# Patient Record
Sex: Male | Born: 1970
Health system: Southern US, Community
[De-identification: ages and names within clinical notes are randomized; demographics above are authoritative.]

## PROBLEM LIST (undated history)

## (undated) ENCOUNTER — Emergency Department (HOSPITAL_COMMUNITY): Admission: EM | Payer: 59 | Source: Home / Self Care

## (undated) DIAGNOSIS — J189 Pneumonia, unspecified organism: Secondary | ICD-10-CM

## (undated) DIAGNOSIS — E119 Type 2 diabetes mellitus without complications: Secondary | ICD-10-CM

## (undated) DIAGNOSIS — R519 Headache, unspecified: Secondary | ICD-10-CM

## (undated) DIAGNOSIS — G8929 Other chronic pain: Secondary | ICD-10-CM

## (undated) DIAGNOSIS — G5603 Carpal tunnel syndrome, bilateral upper limbs: Secondary | ICD-10-CM

## (undated) DIAGNOSIS — G459 Transient cerebral ischemic attack, unspecified: Secondary | ICD-10-CM

## (undated) DIAGNOSIS — I82409 Acute embolism and thrombosis of unspecified deep veins of unspecified lower extremity: Secondary | ICD-10-CM

## (undated) DIAGNOSIS — I2699 Other pulmonary embolism without acute cor pulmonale: Secondary | ICD-10-CM

## (undated) DIAGNOSIS — R51 Headache: Secondary | ICD-10-CM

## (undated) DIAGNOSIS — Q984 Klinefelter syndrome, unspecified: Secondary | ICD-10-CM

## (undated) DIAGNOSIS — K2970 Gastritis, unspecified, without bleeding: Secondary | ICD-10-CM

## (undated) DIAGNOSIS — B9681 Helicobacter pylori [H. pylori] as the cause of diseases classified elsewhere: Secondary | ICD-10-CM

## (undated) DIAGNOSIS — K219 Gastro-esophageal reflux disease without esophagitis: Secondary | ICD-10-CM

## (undated) HISTORY — DX: Klinefelter syndrome, unspecified: Q98.4

## (undated) HISTORY — DX: Gastro-esophageal reflux disease without esophagitis: K21.9

## (undated) HISTORY — DX: Headache: R51

## (undated) HISTORY — DX: Acute embolism and thrombosis of unspecified deep veins of unspecified lower extremity: I82.409

## (undated) HISTORY — DX: Headache, unspecified: R51.9

## (undated) HISTORY — DX: Gastritis, unspecified, without bleeding: K29.70

## (undated) HISTORY — DX: Helicobacter pylori (H. pylori) as the cause of diseases classified elsewhere: B96.81

## (undated) HISTORY — DX: Other pulmonary embolism without acute cor pulmonale: I26.99

## (undated) HISTORY — DX: Carpal tunnel syndrome, bilateral upper limbs: G56.03

## (undated) HISTORY — DX: Pneumonia, unspecified organism: J18.9

## (undated) HISTORY — DX: Transient cerebral ischemic attack, unspecified: G45.9

## (undated) HISTORY — DX: Other chronic pain: G89.29

---

## 1998-10-28 ENCOUNTER — Ambulatory Visit: Admission: RE | Admit: 1998-10-28 | Discharge: 1998-10-28 | Payer: Self-pay | Admitting: Internal Medicine

## 2005-02-15 ENCOUNTER — Emergency Department (HOSPITAL_COMMUNITY): Admission: EM | Admit: 2005-02-15 | Discharge: 2005-02-15 | Payer: Self-pay | Admitting: Family Medicine

## 2006-06-23 ENCOUNTER — Ambulatory Visit: Payer: Self-pay | Admitting: Internal Medicine

## 2006-06-30 ENCOUNTER — Encounter (INDEPENDENT_AMBULATORY_CARE_PROVIDER_SITE_OTHER): Payer: Self-pay | Admitting: *Deleted

## 2006-06-30 ENCOUNTER — Ambulatory Visit (HOSPITAL_COMMUNITY): Admission: RE | Admit: 2006-06-30 | Discharge: 2006-06-30 | Payer: Self-pay | Admitting: Internal Medicine

## 2006-06-30 DIAGNOSIS — B9681 Helicobacter pylori [H. pylori] as the cause of diseases classified elsewhere: Secondary | ICD-10-CM

## 2006-06-30 HISTORY — DX: Helicobacter pylori (H. pylori) as the cause of diseases classified elsewhere: B96.81

## 2006-07-05 ENCOUNTER — Ambulatory Visit: Payer: Self-pay | Admitting: Internal Medicine

## 2007-08-27 ENCOUNTER — Emergency Department (HOSPITAL_COMMUNITY): Admission: EM | Admit: 2007-08-27 | Discharge: 2007-08-27 | Payer: Self-pay | Admitting: Emergency Medicine

## 2007-11-22 ENCOUNTER — Telehealth: Payer: Self-pay | Admitting: Internal Medicine

## 2008-02-19 ENCOUNTER — Encounter: Payer: Self-pay | Admitting: Internal Medicine

## 2008-05-27 ENCOUNTER — Emergency Department (HOSPITAL_COMMUNITY): Admission: EM | Admit: 2008-05-27 | Discharge: 2008-05-27 | Payer: Self-pay | Admitting: Emergency Medicine

## 2010-08-23 ENCOUNTER — Encounter (INDEPENDENT_AMBULATORY_CARE_PROVIDER_SITE_OTHER): Payer: Self-pay | Admitting: *Deleted

## 2010-09-02 NOTE — Letter (Signed)
Summary: New Patient letter  [facility]  [address] [place], [place] [address]   Phone: [phone]  Fax: [phone]       08/23/2010 MRN: [id number]  [name] [address] [place], [place]  [address]  Dear Timothy [name],  Welcome to the Gastroenterology Division at [facility].    You are scheduled to see Dr.  Juanda Chance on 10-11-10 at 2:45P.M. on the 3rd floor at [facility], [address]  We ask that you try to arrive at our office 15 minutes prior to your appointment time to allow for check-in.  We would like you to complete the enclosed self-administered evaluation form prior to your visit and bring it with you on the day of your appointment.  We will review it with you.  Also, please bring a complete list of all your medications or, if you prefer, bring the medication bottles and we will list them.  Please bring your insurance card so that we may make a copy of it.  If your insurance requires a referral to see a specialist, please bring your referral form from your primary care physician.  Co-payments are due at the time of your visit and may be paid by cash, check or credit card.     Your office visit will consist of a consult with your physician (includes a physical exam), any laboratory testing he/she may order, scheduling of any necessary diagnostic testing (e.g. x-ray, ultrasound, CT-scan), and scheduling of a procedure (e.g. Endoscopy, Colonoscopy) if required.  Please allow enough time on your schedule to allow for any/all of these possibilities.    If you cannot keep your appointment, please call [phone] to cancel or reschedule prior to your appointment date.  This allows [place] the opportunity to schedule an appointment for another patient in need of care.  If you do not cancel or reschedule by 5 p.m. the business day prior to your appointment date, you will be charged a $50.00 late cancellation/no-show fee.    Thank you for choosing  [facility] for your medical needs.  We appreciate the opportunity to care for you.  Please visit [place] at our website  to learn more about our practice.                     Sincerely,                                                             The Gastroenterology Division

## 2010-09-10 ENCOUNTER — Ambulatory Visit (HOSPITAL_BASED_OUTPATIENT_CLINIC_OR_DEPARTMENT_OTHER): Payer: 59 | Attending: Family Medicine

## 2010-09-10 DIAGNOSIS — G4730 Sleep apnea, unspecified: Secondary | ICD-10-CM | POA: Insufficient documentation

## 2010-09-10 DIAGNOSIS — G4710 Hypersomnia, unspecified: Secondary | ICD-10-CM | POA: Insufficient documentation

## 2010-09-18 DIAGNOSIS — G4730 Sleep apnea, unspecified: Secondary | ICD-10-CM

## 2010-09-18 DIAGNOSIS — G4710 Hypersomnia, unspecified: Secondary | ICD-10-CM

## 2010-09-19 NOTE — Procedures (Signed)
[name], Timothy [name]                 ACCOUNT NO.:  [id number]  MEDICAL RECORD NO.:  [id number]         PATIENT TYPE:  OUT  LOCATION:  SLEEP CENTER                 FACILITY:  [facility]  PHYSICIAN:  Clinton D. Maple Hudson, MD, FCCP, FACPDATE OF BIRTH:  12/23/70  DATE OF STUDY:  09/10/2010                           NOCTURNAL POLYSOMNOGRAM  REFERRING PHYSICIAN:  DAVID GIBSON  INDICATION FOR STUDY:  Hypersomnia with sleep apnea.  EPWORTH SLEEPINESS SCORE:  18/24, BMI 30.4.  Weight 250 pounds, height 76 inches.  Neck 16 inches.  MEDICATIONS:  Home medications are charted and reviewed.  SLEEP ARCHITECTURE:  Total sleep time 352 minutes with sleep efficiency 92.8%.  Stage I was 7.1%, stage II 71.7%, stage III absent, REM 21.2% of total sleep time.  Sleep latency 10.5 minutes, REM latency 70.5 minutes, awake after sleep onset 18 minutes, arousal index 13.6.  BEDTIME MEDICATION:  None.  RESPIRATORY DATA:  Apnea-hypopnea index (AHI) 0.7 per hour.  A total of 4 events was scored, all as hypopneas associated with non supine sleep and a REM.  RDI 2 per hour, REM/AHI 3.2 per hour.  There were insufficient events to permit application of CPAP titration by split protocol on the study night.  OXYGEN DATA:  Moderate snoring with oxygen desaturation to a nadir of 88% and a mean oxygen saturation on room air through the study of 93.3%.  CARDIAC DATA:  Normal sinus rhythm.  MOVEMENT-PARASOMNIA:  No significant movement disturbance.  No bathroom trips.  IMPRESSIONS-RECOMMENDATIONS: 1. Occasional respiratory event with sleep disturbance, within normal     limits, AHI 0.7 per hour (normal range 0-5 per hour).  Moderate     snoring with oxygen desaturation to a nadir of 88% and a mean     oxygen saturation     through the study of 93.3% on room air. 2. Unremarkable sleep architecture for sleep center environment.     Clinton D. Maple Hudson, MD, [facility], FACP Diplomate, Biomedical engineer of Sleep  Medicine Electronically Signed    CDY/MEDQ  D:  09/18/2010 12:49:57  T:  09/19/2010 04:50:25  Job:  [id number]

## 2010-10-05 ENCOUNTER — Inpatient Hospital Stay (INDEPENDENT_AMBULATORY_CARE_PROVIDER_SITE_OTHER)
Admission: RE | Admit: 2010-10-05 | Discharge: 2010-10-05 | Disposition: A | Discharge status: Home / Self Care | Payer: 59 | Source: Ambulatory Visit | Attending: Family Medicine | Admitting: Family Medicine

## 2010-10-05 DIAGNOSIS — J029 Acute pharyngitis, unspecified: Secondary | ICD-10-CM

## 2010-10-08 ENCOUNTER — Encounter: Payer: Self-pay | Admitting: Internal Medicine

## 2010-10-11 ENCOUNTER — Encounter: Payer: Self-pay | Admitting: Internal Medicine

## 2010-10-11 ENCOUNTER — Ambulatory Visit (INDEPENDENT_AMBULATORY_CARE_PROVIDER_SITE_OTHER): Payer: 59 | Admitting: Internal Medicine

## 2010-10-11 VITALS — BP 112/74 | HR 84 | Ht 76.0 in | Wt 254.4 lb

## 2010-10-11 DIAGNOSIS — K219 Gastro-esophageal reflux disease without esophagitis: Secondary | ICD-10-CM

## 2010-10-11 MED ORDER — ESOMEPRAZOLE MAGNESIUM 40 MG PO CPDR: 40.0000 mg | DELAYED_RELEASE_CAPSULE | Freq: Two times a day (BID) | ORAL | Status: DC

## 2010-10-11 NOTE — Progress Notes (Signed)
Timothy [name] 03/12/1971 MRN [id number]     History of Present Illness:  This is a 40 year old white male with a history of chronic gastroesophageal reflux. Her last office visit was in 2008. Her last upper endoscopy in 2008 showed esophagitis. His mother who is our patient has Barrett's esophagus. His symptoms were initially controlled with Nexium 40 mg a day but recently, he has had refractory symptoms. He has occasional dysphagia. He has occasional nocturnal cough but denies hoarseness. He was treated for H. Pylori in the past.   Past Medical History  Diagnosis Date  . Helicobacter pylori gastritis 06/30/06  . GERD (gastroesophageal reflux disease)    Past Surgical History  Procedure Date  . Unremarkable     reports that he has never smoked. He has never used smokeless tobacco. He reports that he does not drink alcohol or use illicit drugs. family history includes Barrett's esophagus in his mother; Breast cancer in his mother; Diabetes in his paternal grandfather; Heart disease in an unspecified family member; and Irritable bowel syndrome in his brother. No Known Allergies      Review of Systems:  The remainder of the 10  point ROS is negative except as outlined in H&P   Physical Exam: General appearance  Well developed, in no distress. Eyes- non icteric. HEENT nontraumatic, normocephalic. Mouth no lesions, tongue papillated, no cheilosis. Neck supple without adenopathy, thyroid not enlarged, no carotid bruits, no JVD. Lungs Clear to auscultation bilaterally. Cor normal S1 normal S2, regular rhythm , no murmur,  quiet precordium. Abdomen soft nontender with normoactive bowel sounds. Rectal: Deferred. Extremities no pedal edema. Skin no lesions. Neurological alert and oriented x 3.  Psychological normal mood and affect.  Assessment and Plan:   Problem #1: Gastroesophageal reflux disease with refractory symptoms on Nexium 40 mg daily. Patient has been advised to  follow stricter antireflux measures. We will proceed with an upper endoscopy and possible dilatation. We will increase his Nexium to 40 mg by mouth twice a day.  10/11/2010 [name]

## 2010-10-11 NOTE — Patient Instructions (Addendum)
We have sent a prescription for Nexium to your pharmacy. You should take 1 tablet by mouth twice daily. We have recommended that you have an endoscopy with possible dilation. We will wait on you to call and schedule this per your request. CC: Dr Rosezetta Schlatter

## 2010-10-22 NOTE — Assessment & Plan Note (Signed)
Timothy [name]                         GASTROENTEROLOGY OFFICE NOTE   Timothy [name], Timothy [name]                        MRN:          [id number]  DATE:06/23/2006                            DOB:          May 10, 1971    Timothy [name] is a 40 year old gentleman here for evaluation of recurrent  heartburn.  We saw him in 2000 for gastroesophageal reflux disease.  Upper endoscopy showed essentially normal exam.  His CLOtest was  negative for H. pylori.  His mother has been a patient of ours for  Barrett's esophagus and underwent Nissen fundoplication.  Timothy [name] did well  for a while on Nexium 40 mg a day, until his insurance switched him to  Prilosec 20 mg a day about 3 years ago.  It initially worked partially.  Later on, he had to increase it to twice a day.  Now he has breakthrough  heartburn several times a week, and occasionally dysphagia.  He does not  smoke, does not drink alcohol excessively.  He has cut out caffeine out  of his diet.  His eating habits are regular, having dinner around 7:30  p.m.  He has, however, gained about 20 pounds since I saw him last time,  6 years ago.   PHYSICAL EXAMINATION:  Blood pressure 120/70, pulse 72, and weight 254  pounds.  He was alert, oriented, and in no distress.  LUNGS:  Clear to auscultation.  There were no wheezes or rales.  COR:  Normal S1 and normal S2.  ABDOMEN:  Soft, mildly protuberant with normoactive bowel sounds.  No  tenderness in epigastrium.  RECTAL EXAM:  Not done.  EXTREMITIES:  No edema.   IMPRESSION:  A 40 year old white male with recurrent gastroesophageal  reflux, not controlled on omeprazole 20 mg twice a day.  He has a family  history of Barrett's esophagus and severe reflux disease.   PLAN:  1. Repeat upper endoscopy to rule out Barrett's esophagus.  2. Weight loss.  3. Put back on Nexium 40 mg a day.  Patient will find out from his      insurance company preferable PPI, so we can minimize his co  pay.      If he has intractable reflux, he may have to be evaluated for      Nissen fundoplication.     Timothy [name]. Timothy Chance, MD  Electronically Signed    DMB/MedQ  DD: 06/23/2006  DT: 06/23/2006  Job #: [id number]   cc:   Evelena Peat, M.D.

## 2010-11-05 ENCOUNTER — Ambulatory Visit (INDEPENDENT_AMBULATORY_CARE_PROVIDER_SITE_OTHER): Payer: 59 | Admitting: Pulmonary Disease

## 2010-11-05 ENCOUNTER — Institutional Professional Consult (permissible substitution): Payer: 59 | Admitting: Internal Medicine

## 2010-11-05 ENCOUNTER — Encounter: Payer: Self-pay | Admitting: Pulmonary Disease

## 2010-11-05 VITALS — BP 126/88 | HR 71 | Temp 98.0°F | Ht 76.0 in | Wt 253.2 lb

## 2010-11-05 DIAGNOSIS — G478 Other sleep disorders: Secondary | ICD-10-CM

## 2010-11-05 DIAGNOSIS — G4710 Hypersomnia, unspecified: Secondary | ICD-10-CM | POA: Insufficient documentation

## 2010-11-05 NOTE — Patient Instructions (Addendum)
Work on weight loss, and staying off back while sleeping Try to go to bed earlier if possible You need to see Dr. Juanda Chance for your ongoing reflux disease despite aggressive treatment with nexium.  This may be the cause of your sleep disruption. Would give this a few months, working on the above.  If you continue to have issues, can consider home sleep testing to recheck for sleep apnea in your more familiar environment.   Please let me know.

## 2010-11-05 NOTE — Progress Notes (Signed)
  Subjective:    Patient ID: Timothy [name], male    DOB: [birth year], 40 y.o.   MRN: [id number]  HPI The pt is a 39y/o male who I have been asked to see for sleep issues.  He has had a problem with his sleep for approx. 6mos, and the main issue is related to frequent awakenings.  He goes to bed around 12mn, and gets to sleep within .  He has very restless sleep with tossing and turning, and awakens 3-4 times a night.  He is unsure what awakens him at night, but admits that he has very severe reflux with persistent symptoms despite being on nexium bid.  He has some snoring noted, but no witnessed abnormal breathing pattern during sleep.  His wife denies kicking during the night, and he denies sx c/w RLS.  He has had a sleep study 09/2010 which showed no clinically significant sleep apnea, but did have moderate snoring.  He had no abnormal movements, and no significant desaturation.  He gets up at 630am to start his day, but has a very difficult time getting started in the am's.  His wife states it is almost impossible to arouse him in the am's.  He states that he has always been difficult to awaken in the am's his whole life.  Once the pt "gets moving", he denies any sleepiness issues.  He has no issues staying awake in class, but once he gets home and sits down he may doze.  He denies any sleepiness issues driving.  I have gone over environmental issues in the bedroom which can affect sleep, and he denies having any of these.     Review of Systems  Constitutional: Negative for fever and unexpected weight change.  HENT: Positive for congestion, sore throat and trouble swallowing. Negative for ear pain, nosebleeds, rhinorrhea, sneezing, dental problem, postnasal drip and sinus pressure.   Eyes: Negative for redness and itching.  Respiratory: Positive for shortness of breath. Negative for chest tightness and wheezing.   Cardiovascular: Negative for palpitations and leg swelling.  Gastrointestinal:  Negative for nausea and vomiting.  Genitourinary: Negative for dysuria.  Musculoskeletal: Negative for joint swelling.  Skin: Negative for rash.  Neurological: Positive for headaches.  Hematological: Does not bruise/bleed easily.  Psychiatric/Behavioral: Negative for dysphoric mood. The patient is nervous/anxious.        Objective:   Physical Exam Constitutional:  Overweight male, no acute distress  HENT:  Nares patent without discharge, very mild deviation to left  Oropharynx without exudate, palate and uvula are minimally elongated.  Minimal tonsillar hypertrophy  Eyes:  Perrla, eomi, no scleral icterus  Neck:  No JVD, no TMG  Cardiovascular:  Normal rate, regular rhythm, no rubs or gallops.  No murmurs        Intact distal pulses  Pulmonary :  Normal breath sounds, no stridor or respiratory distress   No rales, rhonchi, or wheezing  Abdominal:  Soft, nondistended, bowel sounds present.  No tenderness noted.   Musculoskeletal:  No lower extremity edema noted.  Lymph Nodes:  No cervical lymphadenopathy noted  Skin:  No cyanosis noted  Neurologic:  Alert, appropriate, moves all 4 extremities without obvious deficit.         Assessment & Plan:

## 2010-11-08 ENCOUNTER — Encounter: Payer: Self-pay | Admitting: Pulmonary Disease

## 2010-11-08 NOTE — Assessment & Plan Note (Signed)
The pt has difficulty awakening/arousing in the am's with no specific etiology for this.  His sleep study does not show OSA, although he may have the UARS with his snoring.  He has nothing to suggest a movement disorder of sleep, no environmental issues, and is not truly sleepy during the day with inactivity.  He does have persistent issues with reflux despite taking bid PPI, and feels this is bothersome to him.  I wonder if this is disrupting his sleep at night??  I have recommended the pt get back with his GI MD, work on weight loss, avoid sleeping supine if possible, and to take a critical survey of his sleeping environment to see if anything may be causing his frequent awakenings.  If he has worsening of his issues, I am happy to see him again.

## 2011-03-31 ENCOUNTER — Telehealth: Payer: Self-pay | Admitting: Internal Medicine

## 2011-03-31 DIAGNOSIS — K219 Gastro-esophageal reflux disease without esophagitis: Secondary | ICD-10-CM

## 2011-03-31 NOTE — Telephone Encounter (Signed)
Left a message for patient to call me. 

## 2011-03-31 NOTE — Telephone Encounter (Signed)
a 

## 2011-03-31 NOTE — Telephone Encounter (Signed)
OK to schedule EGD.

## 2011-03-31 NOTE — Telephone Encounter (Signed)
Spoke with patient and scheduled EGD with possible dil on 04/18/11 3:00 PM arrival for 4:00 PM procedure. Pre visit scheduled on 04/11/11 at 10:00 AM.

## 2011-03-31 NOTE — Telephone Encounter (Signed)
Spoke with patient and he now has insurance that will cover an EGD. He wants to schedule it now. Last ov was 10/11/10 chronic GERD with refactory symptoms. Last EGD 2008- esophagitis. Can I schedule an EGD with dil as discussed as his visit in May?

## 2011-04-08 ENCOUNTER — Other Ambulatory Visit: Payer: 59 | Admitting: Internal Medicine

## 2011-04-18 ENCOUNTER — Other Ambulatory Visit: Payer: 59 | Admitting: Internal Medicine

## 2011-05-16 ENCOUNTER — Ambulatory Visit (AMBULATORY_SURGERY_CENTER): Payer: 59 | Admitting: *Deleted

## 2011-05-16 VITALS — Ht 76.0 in | Wt 254.8 lb

## 2011-05-16 DIAGNOSIS — K219 Gastro-esophageal reflux disease without esophagitis: Secondary | ICD-10-CM

## 2011-05-23 ENCOUNTER — Ambulatory Visit (AMBULATORY_SURGERY_CENTER): Payer: 59 | Admitting: Internal Medicine

## 2011-05-23 ENCOUNTER — Encounter: Payer: Self-pay | Admitting: Internal Medicine

## 2011-05-23 VITALS — BP 139/80 | HR 70 | Temp 96.6°F | Resp 16 | Ht 76.0 in | Wt 254.0 lb

## 2011-05-23 DIAGNOSIS — K2940 Chronic atrophic gastritis without bleeding: Secondary | ICD-10-CM

## 2011-05-23 DIAGNOSIS — K219 Gastro-esophageal reflux disease without esophagitis: Secondary | ICD-10-CM

## 2011-05-23 DIAGNOSIS — K2960 Other gastritis without bleeding: Secondary | ICD-10-CM

## 2011-05-23 DIAGNOSIS — K209 Esophagitis, unspecified: Secondary | ICD-10-CM

## 2011-05-23 MED ORDER — ESOMEPRAZOLE MAGNESIUM 40 MG PO CPDR: 40.0000 mg | DELAYED_RELEASE_CAPSULE | Freq: Two times a day (BID) | ORAL | Status: DC

## 2011-05-23 MED ORDER — SODIUM CHLORIDE 0.9 % IV SOLN: 500.0000 mL | INTRAVENOUS | Status: DC

## 2011-05-23 NOTE — Op Note (Signed)
[facility] [address] [place], [place]  [address]  ENDOSCOPY PROCEDURE REPORT  PATIENT:  Timothy, [name]  MR#:  [id number] BIRTHDATE:  [birth year], 39 yrs. old  GENDER:  male  ENDOSCOPIST:  Hedwig Morton. Juanda Chance, MD Referred by:  Barron Alvine, M.D.  PROCEDURE DATE:  05/23/2011 PROCEDURE:  EGD with biopsy, [id number] ASA CLASS:  Class I INDICATIONS:  GERD, dysphagia last EGD 2008, mother with Barrett's  refractory to Nexiem 40 mg/day  MEDICATIONS:   These medications were titrated to patient response per physician's verbal order, Versed 9 mg, Fentanyl 75 mcg TOPICAL ANESTHETIC:  Cetacaine Spray  DESCRIPTION OF PROCEDURE:   After the risks benefits and alternatives of the procedure were thoroughly explained, informed consent was obtained.  [facility] [id number] endoscope was introduced through the mouth and advanced to the second portion of the duodenum, without limitations.  The instrument was slowly withdrawn as the mucosa was fully examined. <<PROCEDUREIMAGES>>  Normal GE junction was noted. With standard forceps, a biopsy was obtained and sent to pathology (see image1). no stricture  Mild gastritis was found. reticulated mucosa With standard forceps, a biopsy was obtained and sent to pathology. hx of H (see image2, image4, and image5).Pylori  Otherwise the examination was normal (see image3).    Retroflexed views revealed no abnormalities. The scope was then withdrawn from the patient and the procedure completed.  COMPLICATIONS:  None  ENDOSCOPIC IMPRESSION: 1) Normal GE junction 2) Mild gastritis 3) Otherwise normal examination no evidence of esophageal stricture RECOMMENDATIONS: 1) Await pathology results 2) Anti-reflux regimen to be follow increase Nexiem to 40 mg po bid  REPEAT EXAM:  In 0 year(s) for.  ______________________________ Hedwig Morton. Juanda Chance, MD  CC:  n. eSIGNED:   Hedwig Morton. Alexei Ey at 05/23/2011 03:23 PM  Alonna Buckler, [id number]

## 2011-05-23 NOTE — Progress Notes (Signed)
Patient did not experience any of the following events: a burn prior to discharge; a fall within the facility; wrong site/side/patient/procedure/implant event; or a hospital transfer or hospital admission upon discharge from the facility. (G8907) Patient did not have preoperative order for IV antibiotic SSI prophylaxis. (G8918)  

## 2011-05-24 ENCOUNTER — Telehealth: Payer: Self-pay | Admitting: *Deleted

## 2011-05-24 NOTE — Telephone Encounter (Signed)

## 2011-05-31 ENCOUNTER — Encounter: Payer: Self-pay | Admitting: Internal Medicine

## 2011-10-12 ENCOUNTER — Telehealth: Payer: Self-pay | Admitting: Pulmonary Disease

## 2011-10-12 NOTE — Telephone Encounter (Signed)
Closed by accident

## 2011-10-12 NOTE — Telephone Encounter (Signed)
Timothy Share, MD 10/12/2011 4:21 PM Signed  I would be happy to see him, but really need to have his primary md refer him. I do not want to step on his primary docs toes.

## 2011-10-12 NOTE — Telephone Encounter (Signed)
I would be happy to see him, but really need to have his primary md refer him.  I do not want to step on his primary docs toes.

## 2011-10-12 NOTE — Telephone Encounter (Signed)
I have spoken with Timothy [name] about this and have her asking [name] about it. Will return call to [facility] once I have an answer.

## 2011-10-12 NOTE — Telephone Encounter (Signed)
I left message for Herbert Seta to call back to give a little more detail about what exactly is going on-did mention in the meantime that I will send this message to [facility] and his nurse to get an answer ASAP!!! Informed Lawson Fiscal of this message.

## 2011-10-12 NOTE — Telephone Encounter (Signed)
I spoke with Herbert Seta; she understands that [facility] would be happy to see Timothy [name] but would like to have referral come from PCP; pt has only seen his PCP once as he lives in [place] and works in [place]; pt usually goes to [facility] on [address] when he gets sick; Herbert Seta will have patient call the [facility] and have them place the call for Pulmonary consult referral to [facility].

## 2011-10-12 NOTE — Telephone Encounter (Signed)
Note: [name] called pt's spouse back but accidentally closed the encounter so I have created another msg since spouse returned call. Timothy [name]

## 2011-11-15 ENCOUNTER — Ambulatory Visit: Payer: 59

## 2011-11-15 ENCOUNTER — Ambulatory Visit (INDEPENDENT_AMBULATORY_CARE_PROVIDER_SITE_OTHER): Payer: 59 | Admitting: Emergency Medicine

## 2011-11-15 VITALS — BP 123/80 | HR 52 | Temp 98.0°F | Resp 18 | Wt 256.0 lb

## 2011-11-15 DIAGNOSIS — R079 Chest pain, unspecified: Secondary | ICD-10-CM

## 2011-11-15 LAB — POCT URINALYSIS DIPSTICK
Bilirubin, UA: NEGATIVE
Blood, UA: NEGATIVE
Glucose, UA: NEGATIVE
Ketones, UA: NEGATIVE
Leukocytes, UA: NEGATIVE
Nitrite, UA: NEGATIVE
Urobilinogen, UA: 0.2
pH, UA: 6.5

## 2011-11-15 LAB — POCT UA - MICROSCOPIC ONLY
Bacteria, U Microscopic: NEGATIVE
Casts, Ur, LPF, POC: NEGATIVE
Crystals, Ur, HPF, POC: NEGATIVE
Epithelial cells, urine per micros: NEGATIVE
Mucus, UA: NEGATIVE
RBC, urine, microscopic: NEGATIVE

## 2011-11-15 NOTE — Progress Notes (Signed)
  Subjective:    Timothy [name] is a 42 y.o. male who presents for evaluation of chest pain. Onset was 3 days ago. Symptoms have resolved since that time. The patient describes the pain as sharp and does not radiate. Patient rates pain as a 2/10 in intensity. Associated symptoms are: intermittent shortness of breath. Aggravating factors are: none. Alleviating factors are: none. Patient's cardiac risk factors are: male gender, obesity (BMI >= 30 kg/m2) and sedentary lifestyle. Patient's risk factors for DVT/PE: none. Previous cardiac testing: none.  The following portions of the patient's history were reviewed and updated as appropriate: allergies, current medications, past family history, past medical history, past social history, past surgical history and problem list.  Review of Systems A comprehensive review of systems was negative except for: Gastrointestinal: positive for reflux symptoms    Objective:    BP 123/80  Pulse 52  Temp(Src) 98 F (36.7 C) (Oral)  Resp 18  Wt 256 lb (116.121 kg)  SpO2 97%  General Appearance:    Alert, cooperative, no distress, appears stated age  Head:    Normocephalic, without obvious abnormality, atraumatic  Eyes:    PERRL, conjunctiva/corneas clear, EOM's intact, fundi    benign, both eyes       Ears:    Normal TM's and external ear canals, both ears  Nose:   Nares normal, septum midline, mucosa normal, no drainage    or sinus tenderness  Throat:   Lips, mucosa, and tongue normal; teeth and gums normal  Neck:   Supple, symmetrical, trachea midline, no adenopathy;       thyroid:  No enlargement/tenderness/nodules; no carotid   bruit or JVD  Back:     Symmetric, no curvature, ROM normal, no CVA tenderness  Lungs:     Clear to auscultation bilaterally, respirations unlabored  Chest wall:    No tenderness or deformity  Heart:    Regular rate and rhythm, S1 and S2 normal, no murmur, rub   or gallop  Abdomen:     Soft, non-tender, bowel sounds active all  four quadrants,    no masses, no organomegaly  Genitalia:    Normal male without lesion, discharge or tenderness  Rectal:    Normal tone, normal prostate, no masses or tenderness;   guaiac negative stool  Extremities:   Extremities normal, atraumatic, no cyanosis or edema  Pulses:   2+ and symmetric all extremities  Skin:   Skin color, texture, turgor normal, no rashes or lesions  Lymph nodes:   Cervical, supraclavicular, and axillary nodes normal  Neurologic:   CNII-XII intact. Normal strength, sensation and reflexes      throughout    Cardiographics ECG: normal sinus rhythm, no blocks or conduction defects, no ischemic changes  Imaging Chest x-ray: normal chest x-ray    Assessment:    Chest pain, suspected etiology: chest wall pain    Plan:    Patient history and exam consistent with non-cardiac cause of chest pain. Follow up with me in 1 days.   Instructed to contact [facility] tomorrow and arrange for consultation and stress test.  Call 911 and go to ER for new or worsened symptoms.  Start aspirin daily

## 2011-11-22 ENCOUNTER — Other Ambulatory Visit (INDEPENDENT_AMBULATORY_CARE_PROVIDER_SITE_OTHER): Payer: 59

## 2011-11-22 ENCOUNTER — Ambulatory Visit (INDEPENDENT_AMBULATORY_CARE_PROVIDER_SITE_OTHER): Payer: 59 | Admitting: Pulmonary Disease

## 2011-11-22 ENCOUNTER — Encounter: Payer: Self-pay | Admitting: Pulmonary Disease

## 2011-11-22 VITALS — BP 114/78 | HR 60 | Temp 97.7°F | Ht 76.0 in | Wt 260.0 lb

## 2011-11-22 DIAGNOSIS — R0781 Pleurodynia: Secondary | ICD-10-CM

## 2011-11-22 DIAGNOSIS — R071 Chest pain on breathing: Secondary | ICD-10-CM

## 2011-11-22 DIAGNOSIS — R05 Cough: Secondary | ICD-10-CM

## 2011-11-22 DIAGNOSIS — R059 Cough, unspecified: Secondary | ICD-10-CM | POA: Insufficient documentation

## 2011-11-22 LAB — SEDIMENTATION RATE: Sed Rate: 5 mm/hr (ref 0–22)

## 2011-11-22 NOTE — Assessment & Plan Note (Signed)
The patient has had cough and recurrent episodes of bronchitis multiple times over the last one year.  This typically starts in his sinuses before going into his chest.  I wonder if this is simply chronic sinusitis that has only been partially treated.  I think he also needs to be evaluated for immune deficiency, and also obstructive lung disease given his CT finding of mosaic attenuation.

## 2011-11-22 NOTE — Patient Instructions (Addendum)
Will check ventilation/perfusion scanning to make sure this is not a blood clot to the lung. Will check bloodwork today to evaluate your immune system function Will schedule for breathing tests, and see you back same day to review.

## 2011-11-22 NOTE — Assessment & Plan Note (Signed)
The patient's CT chest rule out large and medium sized vessel emboli, but did not rule out small distal emboli.  I am concerned that he may have had a pulmonary embolus with his oxygen saturations being much lower than his previous visit.  He also has persistent shortness of breath and chest discomfort.  I would like to do a ventilation perfusion scan to evaluate for small PE.  If this is unremarkable, I suspect this represents either a viral pleurisy, or a musculoskeletal issue.  His CT chest does not show any pneumonic process on the left side.

## 2011-11-22 NOTE — Progress Notes (Signed)
Subjective:    Patient ID: Timothy [name], male    DOB: 06-09-1970, 41 y.o.   MRN: [id number]  HPI The patient is a 41 year old male who I've been asked to see for multiple pulmonary issues.  The patient has a more chronic issue that is related to recurring episodes of bronchitis over the last one year.  He tells me that he has had 4-6 episodes, and each time they start with head and sinus congestion and purulence.  He is typically treated with antibiotics with improvement in his symptoms.  His more acute symptom is that of left-sided pleuritic chest pain that started last week spontaneously.  The patient denies any viral prodrome, or any physical activity which could have resulted in musculoskeletal issues.  He underwent a CT scan of his chest to rule out PE, but unfortunately the contrast bolus was suboptimal along with respiratory motion for evaluating the whole vascular tree.  There was no obvious large were mid size vessel emboli.  Small vessel emboli could not be excluded.  The patient was also noted to have classic mosaic attenuation in the lower lobes suggestive of air trapping.  The patient states that his pain is much improved, but it persist.  He is also having shortness of breath above his usual baseline, and his oxygen saturations are lower than his previous values.  The patient denies any lower extremity edema, recent prolonged travel, family history for thromboembolic disease, or any other risk factors.  Regarding his more chronic symptoms, he also notes a recent infestation in September of last year where his attic was full of a large number of bats.  He had to clear the attic of the pests, and had to clean up the excrement.  He did wear special filters and masks.  The patient has not had PFTs, and has no history of asthma.   Review of Systems  Constitutional: Negative.  Negative for fever and unexpected weight change.  HENT: Positive for congestion, sneezing and trouble swallowing.  Negative for ear pain, nosebleeds, sore throat, rhinorrhea, dental problem, postnasal drip and sinus pressure.   Eyes: Negative.  Negative for redness and itching.  Respiratory: Positive for cough and shortness of breath. Negative for chest tightness and wheezing.   Cardiovascular: Positive for chest pain. Negative for palpitations and leg swelling.  Gastrointestinal: Negative for nausea and vomiting.       Heartburn   Genitourinary: Negative.  Negative for dysuria.  Musculoskeletal: Negative.  Negative for joint swelling.  Skin: Negative for rash.  Neurological: Positive for headaches.  Hematological: Negative.  Does not bruise/bleed easily.  Psychiatric/Behavioral: Negative.  Negative for dysphoric mood. The patient is not nervous/anxious.        Objective:   Physical Exam Constitutional:  Obese male, no acute distress  HENT:  Nares patent without discharge  Oropharynx without exudate, palate and uvula are elongated with side wall narrowing.   Eyes:  Perrla, eomi, no scleral icterus  Neck:  No JVD, no TMG  Cardiovascular:  Normal rate, regular rhythm, no rubs or gallops.  No murmurs        Intact distal pulses  Pulmonary :  Normal breath sounds, no stridor or respiratory distress   No rales, rhonchi, or wheezing  Abdominal:  Soft, nondistended, bowel sounds present.  No tenderness noted.   Musculoskeletal:  No lower extremity edema noted. No calf tenderness.   Lymph Nodes:  No cervical lymphadenopathy noted  Skin:  No cyanosis noted  Neurologic:  Alert,  appropriate, moves all 4 extremities without obvious deficit.         Assessment & Plan:

## 2011-11-23 LAB — IGG, IGA, IGM: IgM, Serum: 123 mg/dL (ref 41–251)

## 2011-11-25 ENCOUNTER — Telehealth: Payer: Self-pay | Admitting: Pulmonary Disease

## 2011-11-25 NOTE — Telephone Encounter (Signed)
Notes Recorded by Barbaraann Share, MD on 11/23/2011 at 6:13 PM Please let pt know that his immune system labs are normal .   I spoke with patient about results and he verbalized understanding and had no questions

## 2011-11-29 ENCOUNTER — Other Ambulatory Visit: Payer: Self-pay | Admitting: Pulmonary Disease

## 2011-11-29 ENCOUNTER — Ambulatory Visit (HOSPITAL_COMMUNITY)
Admission: RE | Admit: 2011-11-29 | Discharge: 2011-11-29 | Disposition: A | Discharge status: Home / Self Care | Payer: 59 | Source: Ambulatory Visit | Attending: Pulmonary Disease | Admitting: Pulmonary Disease

## 2011-11-29 ENCOUNTER — Encounter (HOSPITAL_COMMUNITY)
Admission: RE | Admit: 2011-11-29 | Discharge: 2011-11-29 | Disposition: A | Discharge status: Home / Self Care | Payer: 59 | Source: Ambulatory Visit | Attending: Pulmonary Disease | Admitting: Pulmonary Disease

## 2011-11-29 DIAGNOSIS — R071 Chest pain on breathing: Secondary | ICD-10-CM | POA: Insufficient documentation

## 2011-11-29 DIAGNOSIS — R0781 Pleurodynia: Secondary | ICD-10-CM

## 2011-11-29 DIAGNOSIS — R079 Chest pain, unspecified: Secondary | ICD-10-CM | POA: Insufficient documentation

## 2011-11-29 MED ORDER — TECHNETIUM TO 99M ALBUMIN AGGREGATED
3.0000 | Freq: Once | INTRAVENOUS | Status: AC | PRN
  Administered 2011-11-29: 3 via INTRAVENOUS

## 2011-12-06 NOTE — Progress Notes (Signed)
Quick Note:  Called patients home number, [email or web address] ______

## 2011-12-06 NOTE — Progress Notes (Signed)
Quick Note:  Called patients home number [email or web address] ______

## 2011-12-07 ENCOUNTER — Ambulatory Visit (INDEPENDENT_AMBULATORY_CARE_PROVIDER_SITE_OTHER): Payer: 59 | Admitting: Pulmonary Disease

## 2011-12-07 ENCOUNTER — Encounter: Payer: Self-pay | Admitting: Pulmonary Disease

## 2011-12-07 VITALS — BP 112/70 | HR 70 | Temp 98.0°F | Ht 76.0 in | Wt 259.0 lb

## 2011-12-07 DIAGNOSIS — R071 Chest pain on breathing: Secondary | ICD-10-CM

## 2011-12-07 DIAGNOSIS — R05 Cough: Secondary | ICD-10-CM

## 2011-12-07 DIAGNOSIS — R0781 Pleurodynia: Secondary | ICD-10-CM

## 2011-12-07 LAB — PULMONARY FUNCTION TEST

## 2011-12-07 NOTE — Assessment & Plan Note (Signed)
The patient has had a complete pulmonary workup with no obvious etiology for his recurrent episodes of sinobronchitis.  There is nothing to suggest underlying asthma, but we certainly cannot exclude this 100% on the basis of one measurement.  My suspicion is that he may have chronic sinus disease, with recurrent acute episodes.  If he has another flareup, I would do a scan of his sinuses if he is having symptoms.  If this is abnormal, he will need referral to otolaryngology.  I will not schedule further followup, but I am happy to see him again if pulmonary issues arise.

## 2011-12-07 NOTE — Assessment & Plan Note (Signed)
The patient states that his pleuritic chest pain has totally resolved.  I suspect was MSK or a viral pleurisy.

## 2011-12-07 NOTE — Patient Instructions (Addendum)
All of your pulmonary testing to date is unrevealing.  I suspect a lot of your issue may be from your sinuses.  If you have another flareup, would recommend a scan of your sinuses as the first step. I am happy to see you again if you have worsening breathing/pulmonary issues.

## 2011-12-07 NOTE — Progress Notes (Signed)
  Subjective:    Patient ID: Timothy [name], male    DOB: 1971-03-07, 41 y.o.   MRN: [id number]  HPI The patient comes in today for followup of his pleuritic chest pain and also recurrent episodes of sinobronchitis.  His chest pain has totally resolved, and he has had a ventilation perfusion scan that showed no evidence for pulmonary emboli.  His sedimentation rate was also extremely low at 5.  He has had a check of his serum immunoglobulins, with normal levels.  He had full pulmonary function studies today that are totally within normal limits.  The only abnormality that we have found on any of his workup is a mild mosaic pattern on his CT chest consistent with air trapping.   Review of Systems  Constitutional: Negative for fever and unexpected weight change.  HENT: Positive for congestion and sneezing. Negative for ear pain, nosebleeds, sore throat, rhinorrhea, trouble swallowing, dental problem, postnasal drip and sinus pressure.   Eyes: Negative for redness and itching.  Respiratory: Positive for cough. Negative for chest tightness, shortness of breath and wheezing.   Cardiovascular: Negative for palpitations and leg swelling.  Gastrointestinal: Negative for nausea and vomiting.  Genitourinary: Negative for dysuria.  Musculoskeletal: Negative for joint swelling.  Skin: Negative for rash.  Neurological: Negative for headaches.  Hematological: Does not bruise/bleed easily.  Psychiatric/Behavioral: Negative for dysphoric mood. The patient is not nervous/anxious.   All other systems reviewed and are negative.       Objective:   Physical Exam Obese male in no acute distress Nose without purulence or discharge noted Lower extremities without edema, cyanosis Alert and oriented, moves all 4 extremities.       Assessment & Plan:

## 2011-12-07 NOTE — Progress Notes (Signed)
PFT done today. 

## 2012-06-11 ENCOUNTER — Other Ambulatory Visit: Payer: Self-pay | Admitting: *Deleted

## 2012-06-11 MED ORDER — ESOMEPRAZOLE MAGNESIUM 40 MG PO CPDR: 40.0000 mg | DELAYED_RELEASE_CAPSULE | Freq: Two times a day (BID) | ORAL | Status: DC

## 2012-06-12 ENCOUNTER — Telehealth: Payer: Self-pay | Admitting: Internal Medicine

## 2012-06-12 MED ORDER — PANTOPRAZOLE SODIUM 40 MG PO TBEC: 40.0000 mg | DELAYED_RELEASE_TABLET | Freq: Two times a day (BID) | ORAL | Status: DC

## 2012-06-12 NOTE — Telephone Encounter (Signed)
Nexium rx changed to Protonix as per patient request.

## 2012-07-23 ENCOUNTER — Telehealth: Payer: Self-pay | Admitting: Internal Medicine

## 2012-07-23 NOTE — Telephone Encounter (Signed)
This rx was already changed on 06-12-12. I have advised patient and have spoken to @ [facility] pharmacy and have confirmed that rx is available for patient.

## 2012-11-26 ENCOUNTER — Other Ambulatory Visit: Payer: Self-pay | Admitting: Neurology

## 2013-03-28 ENCOUNTER — Ambulatory Visit (INDEPENDENT_AMBULATORY_CARE_PROVIDER_SITE_OTHER): Payer: 59 | Admitting: Neurology

## 2013-03-28 ENCOUNTER — Encounter: Payer: Self-pay | Admitting: Neurology

## 2013-03-28 ENCOUNTER — Encounter (INDEPENDENT_AMBULATORY_CARE_PROVIDER_SITE_OTHER): Payer: Self-pay

## 2013-03-28 VITALS — BP 115/74 | HR 74 | Ht 78.0 in | Wt 266.0 lb

## 2013-03-28 DIAGNOSIS — G5600 Carpal tunnel syndrome, unspecified upper limb: Secondary | ICD-10-CM

## 2013-03-28 DIAGNOSIS — G5603 Carpal tunnel syndrome, bilateral upper limbs: Secondary | ICD-10-CM

## 2013-03-28 DIAGNOSIS — Q984 Klinefelter syndrome, unspecified: Secondary | ICD-10-CM | POA: Insufficient documentation

## 2013-03-28 HISTORY — DX: Carpal tunnel syndrome, bilateral upper limbs: G56.03

## 2013-03-28 HISTORY — DX: Klinefelter syndrome, unspecified: Q98.4

## 2013-03-28 MED ORDER — PROPRANOLOL HCL ER 80 MG PO CP24: 80.0000 mg | ORAL_CAPSULE | Freq: Every day | ORAL | Status: DC

## 2013-03-28 NOTE — Progress Notes (Signed)
[facility]  Provider:  Melvyn Novas, M D  Referring Provider: Barron Alvine, MD Primary Care Physician:  Barron Alvine, MD  Chief Complaint  Patient presents with  . Annual Visit    HPI:  Timothy [name] is a 42 y.o. male  Is seen here as a referral/ revisit  from Dr. Hollice Espy for  Fatigue, sleepiness , possibly related to  Klinefelter syndrome.   [name] in January 2013 that he was here for an evaluation of dizziness-vertical. He had at the time described spells of very brief duration of 12 seconds or so and mentioned that he felt bright lights may trigger the onset of the busy since that dizziness sensation. Once no longer expulsed or bright lights he felt relief. The whole duration maybe 12-20 seconds. He had no associated nausea but some blurred vision but he described as fogginess. At the time I saw him he also had just overcome a sinus headache and when he saw his genitals and throat specialist the physician put became more concerned about the nystagmus on exam. He followed up on 11-04-11 and reported that he was dizziness free and headache free on Inderal but needed his medications refilled. His last visit his BMI was 40 , his blood pressure 110/80,  the heart rate 56 beats per minute his Epworth sleepiness score was 10 points.  Meanwhile the patient has been diagnosed with Klinefelter syndrome and wonders if this is  contributing to his fatigue and sleepiness as he experienced in the past. He wants to get today a refill as he ran out of medication since his last visit was  More than a year ago. He will need propranolol refilled.  His symptoms of  dizziness and  headaches returned,  And he has voiced a new concern of dropping objects form either hand. There is no warning, no pain and no restriction of movements associated.   Today's fatigue severity score is 55 points and his Epworth sleepiness score is 13 points.       Review of Systems: Out of a complete 14  system review, the patient complains of only the following symptoms, and all other reviewed systems are negative. Fatigue  Dizziness. Carpal tunnel .  History   Social History  . Marital Status: Married    Spouse Name: N/A    Number of Children: 1  . Years of Education: N/A   Occupational History  . Full time student    Social History Main Topics  . Smoking status: Never Smoker   . Smokeless tobacco: Never Used  . Alcohol Use: No  . Drug Use: No  . Sexual Activity: Not on file   Other Topics Concern  . Not on file   Social History Narrative  . No narrative on file    Family History  Problem Relation Age of Onset  . Heart disease    . Barrett's esophagus Mother   . Breast cancer Mother   . Diabetes Paternal Grandfather   . Heart disease Paternal Grandfather   . Irritable bowel syndrome Brother   . Allergies Brother   . Heart disease Paternal Grandmother   . Colon cancer Neg Hx   . Esophageal cancer Neg Hx     Past Medical History  Diagnosis Date  . Helicobacter pylori gastritis 06/30/06  . GERD (gastroesophageal reflux disease)   . Chronic headache     Past Surgical History  Procedure Laterality Date  . Unremarkable      Current Outpatient  Prescriptions  Medication Sig Dispense Refill  . diclofenac (CATAFLAM) 50 MG tablet Take 50 mg by mouth daily as needed.        . pantoprazole (PROTONIX) 40 MG tablet Take 1 tablet (40 mg total) by mouth 2 (two) times daily.  60 tablet  3  . propranolol ER (INDERAL LA) 80 MG 24 hr capsule TAKE 1 CAPSULE BY MOUTH AT NIGHT  30 capsule  0  . testosterone (ANDROGEL) 50 MG/5GM GEL Place 81 g onto the skin daily.       No current facility-administered medications for this visit.    Allergies as of 03/28/2013 - Review Complete 03/28/2013  Allergen Reaction Noted  . Ivp dye [iodinated diagnostic agents]  11/22/2011    Vitals: BP 115/74  Pulse 74  Ht 6\' 6"  (1.981 m)  Wt 266 lb (120.657 kg)  BMI 30.75 kg/m2 Last  Weight:  Wt Readings from Last 1 Encounters:  03/28/13 266 lb (120.657 kg)   Last Height:   Ht Readings from Last 1 Encounters:  03/28/13 6\' 6"  (1.981 m)   Physical exam:  General: The patient is awake, alert and appears not in acute distress. The patient is well groomed. Head: Normocephalic, atraumatic. Frontal bolding, bilateral ptosis.   Neck is supple. Mallampati 3 , neck circumference: 16, Cardiovascular:  Regular rate and rhythm , without  murmurs or carotid bruit, and without distended neck veins. Respiratory: Lungs are clear to auscultation. Skin:  Without evidence of edema, or rash, no joint stiffness. Trunk: BMI is  elevated . The  patient  has normal posture.  Neurologic exam : The patient is awake and alert, oriented to place and time.  Memory subjective  described as intact.  There is a normal attention span & concentration ability. Speech is fluent without  dysarthria, dysphonia or aphasia. Mood and affect are appropriate.  Cranial nerves: Pupils are equal and briskly reactive to light. Funduscopic exam without  evidence of pallor or edema. Extraocular movements  in vertical and horizontal planes intact . There is endpoint  Nystagmus, physiologically normal  . Visual fields by finger perimetry are intact. Hearing to finger rub intact.   Facial sensation intact to fine touch. Facial motor strength :symmetric , tongue and uvula move midline. There is a fatigued , tied facial expression.   Motor exam:  muscle tone appears slightly elevated, and  muscle bulk is symmetric,  But there is weakness in grip strength , and flexor weakness and pinch strength in both hands. No pain with ROM , straight leg rise.  Sensory:  Fine touch, pinprick and vibration were tested  as normal in all extremities.  Proprioception is  normal. Coordination: Rapid alternating movements in the fingers/hands is tested and normal. Finger-to-nose maneuver tested and normal without evidence of ataxia,  dysmetria or tremor.  Gait and station: Patient walks without assistive device and is able and assisted stool climb up to the exam table. Strength within normal limits.  Stance is stable and normal. Tandem gait is fragmented. Romberg negative.  Deep tendon reflexes: in the upper and lower extremities are attenuated-  Babinski maneuver response is downgoing.   Assessment:  After physical and neurologic examination, review of laboratory studies, imaging, neurophysiology testing and pre-existing records, assessment is that is of a gentleman with possible Carpal tunnel disorder in both hands, who has had trouble turning keys and opening jars for several month.  He was diagnosed with KLINEFELTER syndrome, after he and his wife couldn't conceive. He has a  large arm span and a truncal obesity , the patient is tall and is frontal bold. His testosterone levels are now supplemented.     Plan:  Treatment plan and additional workup : peripheral muscle weakness is likely carpal tunnel, EMG and NCS to be obtained.  Patient needs Propranolol refilled 90 days , 3 times.

## 2013-03-29 ENCOUNTER — Ambulatory Visit (INDEPENDENT_AMBULATORY_CARE_PROVIDER_SITE_OTHER): Payer: 59 | Admitting: Internal Medicine

## 2013-03-29 ENCOUNTER — Encounter: Payer: Self-pay | Admitting: Internal Medicine

## 2013-03-29 VITALS — BP 100/60 | HR 80 | Ht 76.0 in | Wt 268.4 lb

## 2013-03-29 DIAGNOSIS — K219 Gastro-esophageal reflux disease without esophagitis: Secondary | ICD-10-CM

## 2013-03-29 MED ORDER — ESOMEPRAZOLE MAGNESIUM 40 MG PO CPDR: 40.0000 mg | DELAYED_RELEASE_CAPSULE | Freq: Two times a day (BID) | ORAL | Status: DC

## 2013-03-29 NOTE — Progress Notes (Signed)
Timothy [name] Mar 26, 1971 MRN [id number]        History of Present Illness:  This is a 42 year old white male with chronic gastroesophageal reflux , this is a yearly checkup. He is currently on Protonix 40 mg twice a day and has  breakthrough symptoms of heartburn. He denies hoarseness or nocturnal cough. He used to be on Nexium 40 mg twice a day which gave him complete control of his symptoms. but was switched to Protonix. For  Cost effective purpose. Last upper endoscopy in December 2012 for dysphagia showed  no evidence of Barrett's esophagus, there was no stricture. Upper endoscopy in 2008 showed positive H. pylori which was treated.with triple therapy. He is trying to lose weight from 250-210 pounds so he can join [employer].   Past Medical History  Diagnosis Date  . Helicobacter pylori gastritis 06/30/06  . GERD (gastroesophageal reflux disease)   . Chronic headache   . Carpal tunnel syndrome on both sides 03/28/2013  . 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   Past Surgical History  Procedure Laterality Date  . Unremarkable      reports that he has never smoked. He has never used smokeless tobacco. He reports that he does not drink alcohol or use illicit drugs. family history includes Allergies in his brother; Barrett's esophagus in his mother; Breast cancer in his mother; Diabetes in his paternal grandfather; Heart disease in his paternal grandfather, paternal grandmother, and another family member; Irritable bowel syndrome in his brother. There is no history of Colon cancer or Esophageal cancer. Allergies  Allergen Reactions  . Ivp Dye [Iodinated Diagnostic Agents]         Review of Systems: Negative for dysphagia rectal bleeding diarrhea  The remainder of the 10 point ROS is negative except as outlined in H&P   Physical Exam: General appearance  Well developed, in no distress. Psychological normal mood and affect.  Assessment and Plan:  42 year old white  male with chronic gastroesophageal reflux. His mother has Barrett's esophagus. He has been closely monitored over past several years for Barrett's esophagus. He is currently having  breakthrough  symptoms on Protonix 40 mg twice a day. We will switch him back to Nexium 40 mg twice a day and see if his insurance will cover generic Nexiem.Marland Kitchen He will continue to lose weight and comply with antireflux measures.   03/29/2013 Timothy [name]

## 2013-03-29 NOTE — Patient Instructions (Signed)
We have sent the following medications to your pharmacy for you to pick up at your convenience: Nexium 40 mg twice daily (in place of Protonix)  Cc: Dr Barron Alvine

## 2013-04-04 ENCOUNTER — Encounter: Payer: 59 | Admitting: Radiology

## 2013-04-04 ENCOUNTER — Encounter: Payer: 59 | Admitting: Neurology

## 2013-04-05 ENCOUNTER — Other Ambulatory Visit: Payer: Self-pay | Admitting: *Deleted

## 2013-04-05 DIAGNOSIS — Q984 Klinefelter syndrome, unspecified: Secondary | ICD-10-CM

## 2013-04-05 DIAGNOSIS — G5603 Carpal tunnel syndrome, bilateral upper limbs: Secondary | ICD-10-CM

## 2013-05-08 IMAGING — CR DG CHEST 2V
2 series · 2 of 2 positions shown · non-contrast
Comparison: 11/15/2011.

CLINICAL DATA: Chest pain.

CHEST - 2 VIEW

[w chest pa]
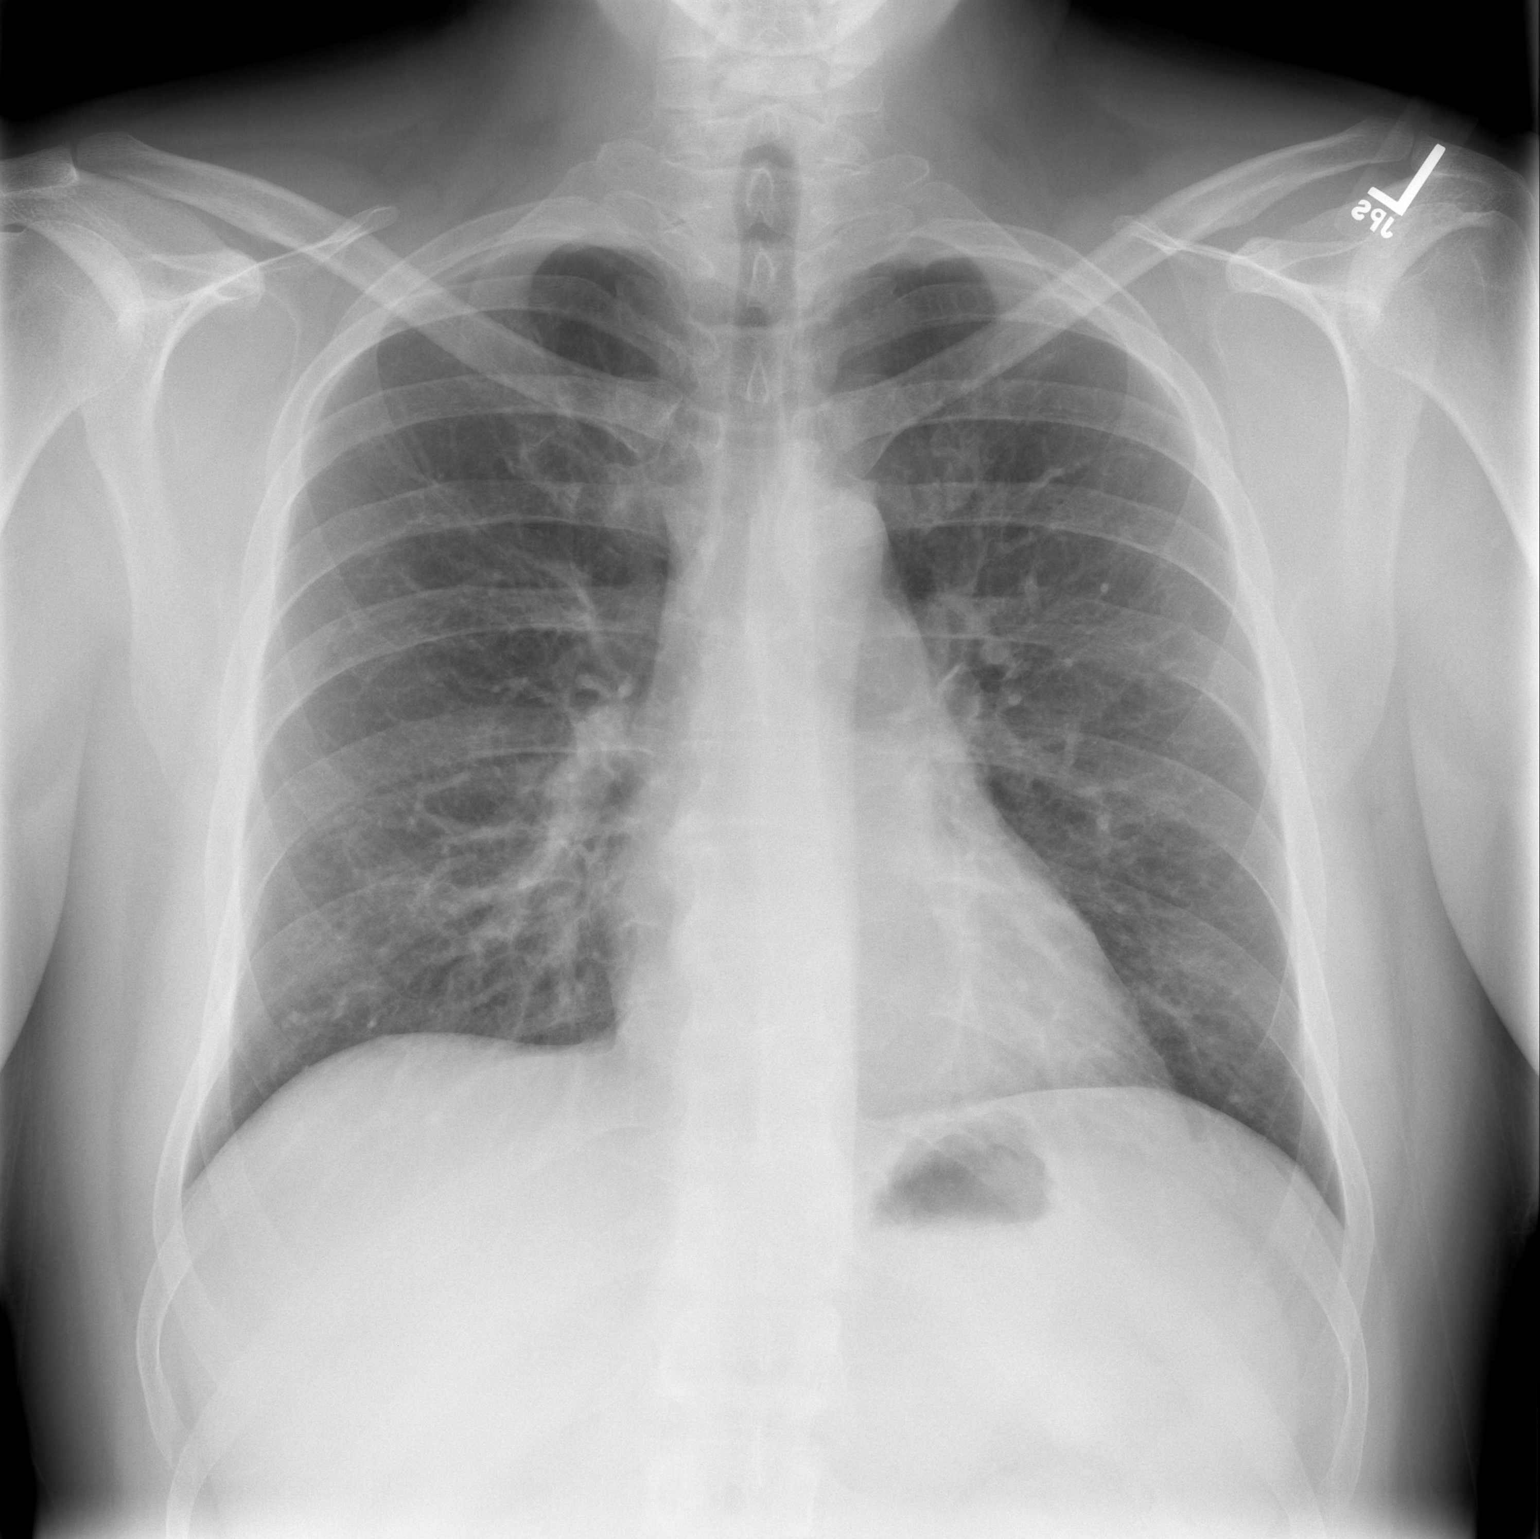

[w chest lat]
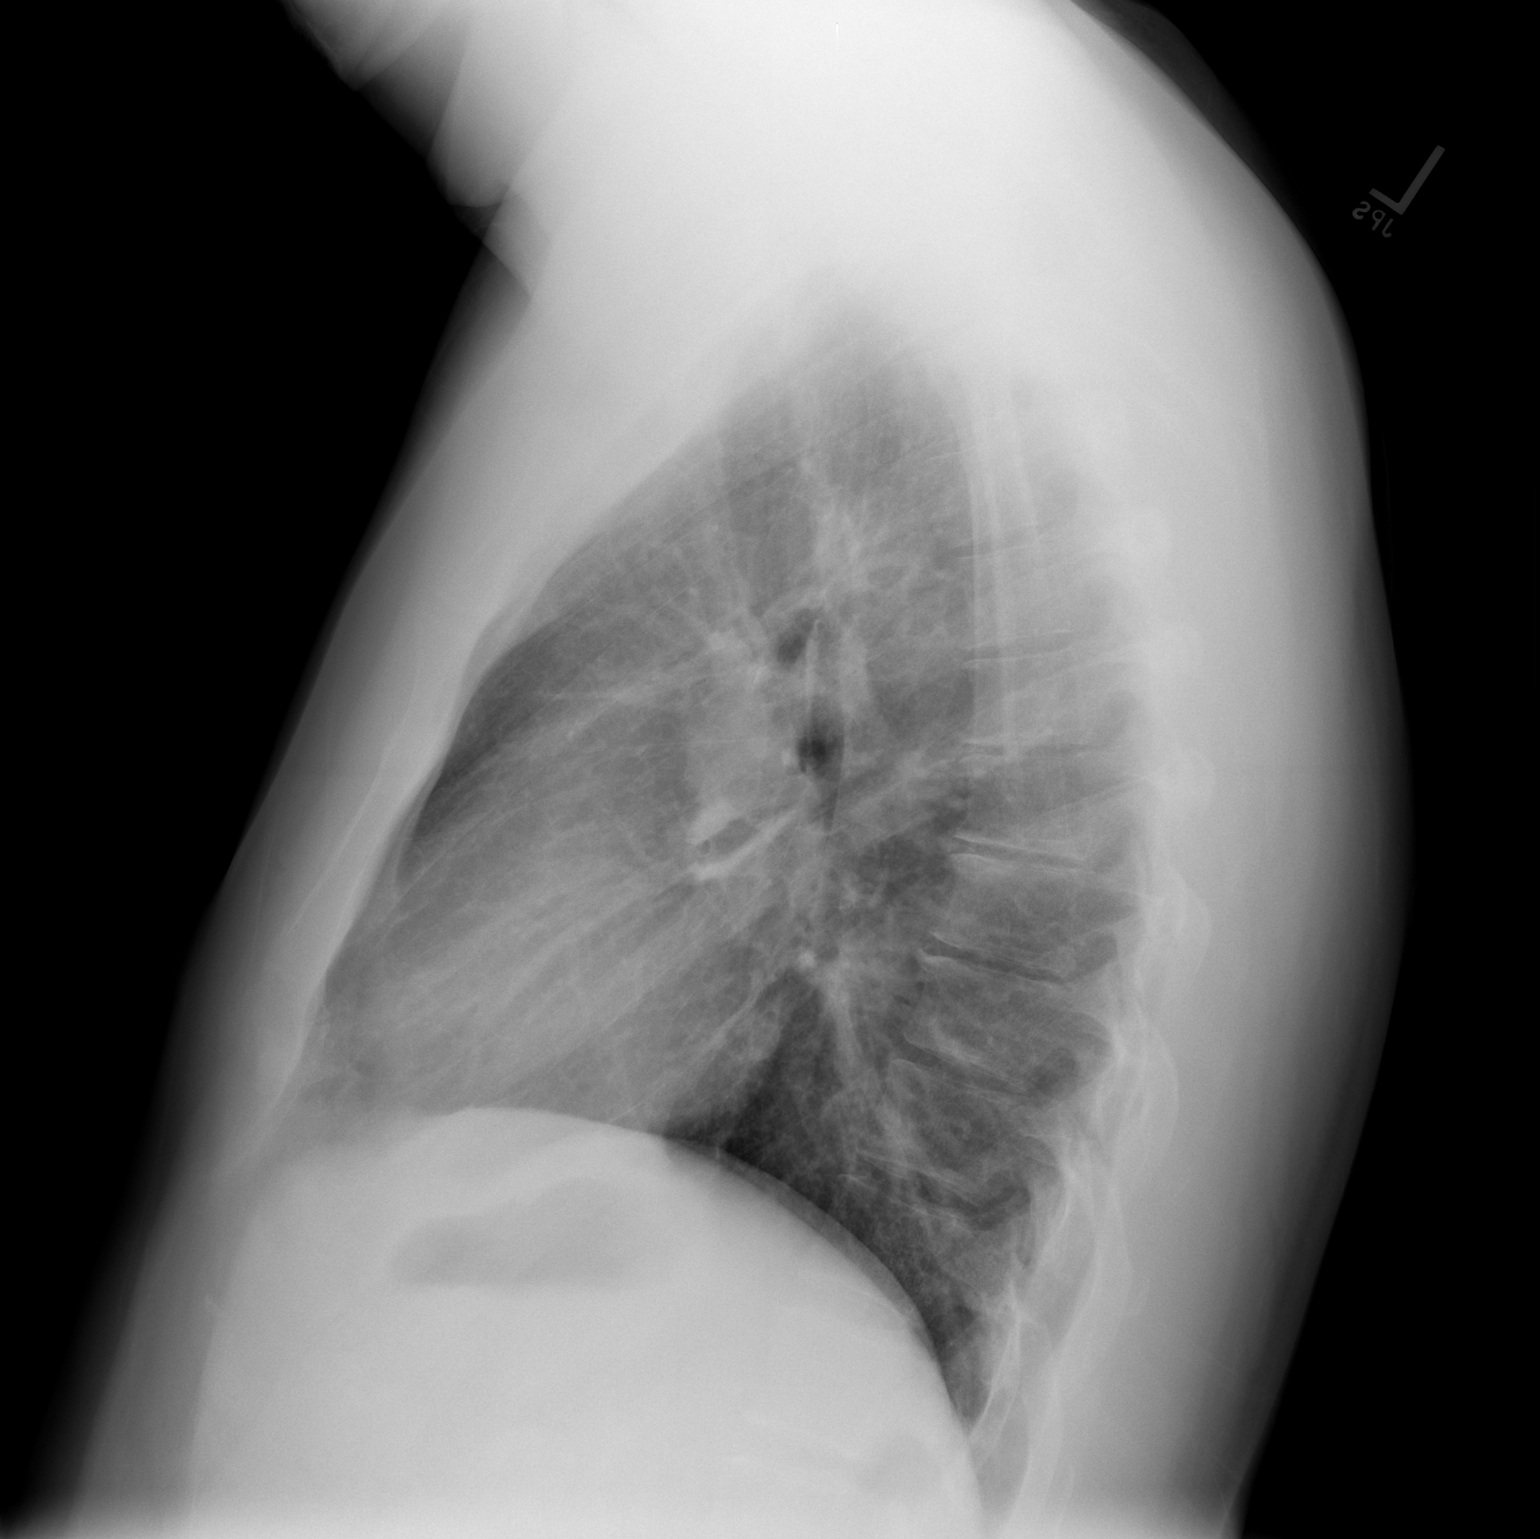

[2 of 2 positions shown; findings below may reference images not displayed]

FINDINGS: The cardiac silhouette, mediastinal and hilar contours
are within normal limits and stable. The lungs are clear.  No
pleural effusions.  The bony thorax is intact.
IMPRESSION: No acute cardiopulmonary findings.

## 2013-07-24 ENCOUNTER — Telehealth: Payer: Self-pay | Admitting: Internal Medicine

## 2013-07-24 MED ORDER — ESOMEPRAZOLE MAGNESIUM 40 MG PO CPDR: 40.0000 mg | DELAYED_RELEASE_CAPSULE | Freq: Two times a day (BID) | ORAL | Status: DC

## 2013-07-24 NOTE — Telephone Encounter (Signed)
Rx was sent for Nexium only short term as patient was previously on Protonix. Patient states that the Nexium is working well for him. Therefore, I will sent him a 90 day prescription with 2 additional refills.

## 2013-09-11 ENCOUNTER — Ambulatory Visit (INDEPENDENT_AMBULATORY_CARE_PROVIDER_SITE_OTHER): Payer: 59 | Admitting: Psychiatry

## 2013-09-11 DIAGNOSIS — F0630 Mood disorder due to known physiological condition, unspecified: Secondary | ICD-10-CM

## 2013-09-26 ENCOUNTER — Ambulatory Visit: Payer: 59 | Admitting: Nurse Practitioner

## 2013-10-02 ENCOUNTER — Encounter (INDEPENDENT_AMBULATORY_CARE_PROVIDER_SITE_OTHER): Payer: Self-pay

## 2013-10-02 ENCOUNTER — Encounter (HOSPITAL_COMMUNITY): Payer: Self-pay | Admitting: Psychiatry

## 2013-10-02 ENCOUNTER — Ambulatory Visit (INDEPENDENT_AMBULATORY_CARE_PROVIDER_SITE_OTHER): Payer: 59 | Admitting: Psychiatry

## 2013-10-02 VITALS — BP 119/66 | HR 63 | Ht 76.0 in | Wt 263.2 lb

## 2013-10-02 DIAGNOSIS — F329 Major depressive disorder, single episode, unspecified: Secondary | ICD-10-CM

## 2013-10-02 DIAGNOSIS — F322 Major depressive disorder, single episode, severe without psychotic features: Secondary | ICD-10-CM

## 2013-10-02 DIAGNOSIS — F3289 Other specified depressive episodes: Secondary | ICD-10-CM

## 2013-10-02 MED ORDER — CITALOPRAM HYDROBROMIDE 20 MG PO TABS: 20.0000 mg | ORAL_TABLET | Freq: Every day | ORAL | Status: DC

## 2013-10-02 NOTE — Progress Notes (Signed)
Psychiatric Assessment Adult  Patient Identification:  Timothy [name] Date of Evaluation:  10/02/2013 Chief Complaint: Depression History of Chief Complaint:  No chief complaint on file.   HPI Patient is 43 year old Caucasian male, here for depression, which started 4-5 months ago. Patient has Klinefelter syndrome, and is taking testosterone. Depression affects all domains of his life. He is married, and has two children (11 weeks, 31/2 months). Stressors are: financial constraints, medical issues, and employment. Significant family hx of psychiatric illness: paternal grandmother with bipolar, depression; sister with unipolar depression and brother with anxiety issues. Patient has anhedonia, poor energy and concentration, PMR, hypersomnia, feelings of hopelessness ness, and impaired memory. He denies SI/HI/AVH.  Review of Systems Physical Exam  Depressive Symptoms: depressed mood, anhedonia, hypersomnia, psychomotor retardation, fatigue, feelings of worthlessness/guilt, difficulty concentrating, hopelessness, impaired memory, suicidal thoughts without plan, anxiety, panic attacks, loss of energy/fatigue, disturbed sleep,  (Hypo) Manic Symptoms:   Elevated Mood:  No Irritable Mood:  Yes IED Grandiosity:  No Distractibility:  No Labiality of Mood:  No Delusions:  No Hallucinations:  No Impulsivity:  No Sexually Inappropriate Behavior:  No Financial Extravagance:  No Flight of Ideas:  No  Anxiety Symptoms: Excessive Worry:  Yes Panic Symptoms:  No Agoraphobia:  No Obsessive Compulsive: No  Symptoms: Counting, Specific Phobias:  Yes Social Anxiety:  No  Psychotic Symptoms:  Hallucinations: No None Delusions:  No Paranoia:  No   Ideas of Reference:  No  PTSD Symptoms: Ever had a traumatic exposure:  No Had a traumatic exposure in the last month:  No Re-experiencing: No None Hypervigilance:  No Hyperarousal: No None Avoidance: No None  Traumatic Brain Injury:  No   Past Psychiatric History: Diagnosis: MDD, single episode, severe   Hospitalizations: none   Outpatient Care: none   Substance Abuse Care: none   Self-Mutilation: none   Suicidal Attempts: none   Violent Behaviors: none    Past Medical History:   Past Medical History  Diagnosis Date  . Helicobacter pylori gastritis 06/30/06  . GERD (gastroesophageal reflux disease)   . Chronic headache   . Carpal tunnel syndrome on both sides 03/28/2013  . 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   History of Loss of Consciousness:  No  Seizure History:  No Cardiac History:  Yes Allergies:   Allergies  Allergen Reactions  . Ivp Dye [Iodinated Diagnostic Agents]    Current Medications:  Current Outpatient Prescriptions  Medication Sig Dispense Refill  . esomeprazole (NEXIUM) 40 MG capsule Take 1 capsule (40 mg total) by mouth 2 (two) times daily.  180 capsule  2  . propranolol ER (INDERAL LA) 80 MG 24 hr capsule Take 1 capsule (80 mg total) by mouth daily.  90 capsule  3  . testosterone (ANDROGEL) 50 MG/5GM GEL Place 81 g onto the skin daily.       No current facility-administered medications for this visit.    Previous Psychotropic Medications:  Medication Dose   None                        Substance Abuse History in the last 12 months: none  Substance Age of 1st Use Last Use Amount Specific Type  Nicotine       Alcohol      Cannabis      Opiates      Cocaine      Methamphetamines      LSD      Ecstasy  Benzodiazepines      Caffeine  13  today   2-3 bottles of soda    Inhalants      Others:                         Social History: [place]  Place of Birth: [place]  Family Members: wife, son 31/2 and daughter 1011 weeks  Marital Status:  Married Children: 2  Sons: 1  Daughters: 1 Relationships: yes  Education:  [employer] Educational Problems/Performance: Regular Classes  Religious Beliefs/Practices: Christian  History of Abuse:  none Occupational Experiences: Editor, commissioningales and Marketing  Military History:  None. Legal History: No Hobbies/Interests: golf; garden   Family History:   Family History  Problem Relation Age of Onset  . Heart disease    . Barrett's esophagus Mother   . Breast cancer Mother   . Diabetes Paternal Grandfather   . Heart disease Paternal Grandfather   . Irritable bowel syndrome Brother   . Allergies Brother   . Heart disease Paternal Grandmother   . Colon cancer Neg Hx   . Esophageal cancer Neg Hx     Mental Status Examination/Evaluation: Objective:  Appearance: Casual  Eye Contact::  Good  Speech:  Slow  Volume:  Normal  Mood:  Dysphoric   Affect:  Restricted  Thought Process:  Goal Directed  Orientation:  Full (Time, Place, and Person)  Thought Content:  Rumination  Suicidal Thoughts:  No  Homicidal Thoughts:  No  Judgement:  Fair  Insight:  Fair  Psychomotor Activity:  Decreased and Psychomotor Retardation  Akathisia:  No  Handed:  Right  AIMS (if indicated): No Abnormal Movement  Assets:  Communication Skills Desire for Improvement Leisure Time Physical Health Resilience Social Support Talents/Skills    Laboratory/X-Ray Psychological Evaluation(s)   NA  Dr. Marius DitchKumar/Cambreigh Dearing, NP   Assessment:  Axis I: Depressive Disorder secondary to general medical condition  AXIS I Depressive Disorder secondary to general medical condition  AXIS II Deferred  AXIS III Past Medical History  Diagnosis Date  . Helicobacter pylori gastritis 06/30/06  . GERD (gastroesophageal reflux disease)   . Chronic headache   . Carpal tunnel syndrome on both sides 03/28/2013  . 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 Patient has Klinefelter syndrome, and is taking testosterone. Depression affects all domains of his life. He is married, and has two children (11 weeks, 31/2 months). Stressors are: financial constraints, medical issues, and employment. Significant family hx of psychiatric illness: paternal grandmother with bipolar, depression; sister with unipolar depression and brother with anxiety issues. Patient has anhedonia, poor energy and concentration, PMR, hypersomnia, feelings of hopelessness ness, and impaired memory. He denies SI/HI/AVH.Patient has supportive wife. Polite, and cooperative; came to appointment with wife. Will trial Citalopram 20 mg po QD. Discussed R/B/SE of medication with patient and wife. Rtc in 4 weeks.    Plan of Care: Medication  Laboratory:  NA  Psychotherapy: Yes, interpersonal therapy; CBT   Medications: Citalopram 20 mg po QD  Routine PRN Medications:  No  Consultations: as needed  Safety Concerns:  None   Other:      Kendrick FriesBLANKMANN, Lasheika Ortloff, NP 4/29/20152:10 PM

## 2013-11-07 ENCOUNTER — Ambulatory Visit (HOSPITAL_COMMUNITY): Payer: Self-pay | Admitting: Psychiatry

## 2014-02-17 ENCOUNTER — Other Ambulatory Visit (HOSPITAL_COMMUNITY): Payer: Self-pay | Admitting: Psychiatry

## 2014-02-27 ENCOUNTER — Other Ambulatory Visit (HOSPITAL_COMMUNITY): Payer: Self-pay | Admitting: *Deleted

## 2014-02-27 MED ORDER — CITALOPRAM HYDROBROMIDE 20 MG PO TABS: 20.0000 mg | ORAL_TABLET | Freq: Every day | ORAL | Status: DC

## 2014-02-27 NOTE — Telephone Encounter (Signed)
RX refill appropriate, refilled by Dr. Lucianne Muss as past provider no longer with practice Patient needs appt for further refills - noted on RX

## 2014-03-19 ENCOUNTER — Ambulatory Visit: Payer: 59 | Admitting: Adult Health

## 2014-03-27 ENCOUNTER — Ambulatory Visit (HOSPITAL_COMMUNITY): Payer: Self-pay | Admitting: Psychiatry

## 2014-03-27 ENCOUNTER — Ambulatory Visit: Payer: Self-pay | Admitting: Pulmonary Disease

## 2014-03-31 ENCOUNTER — Telehealth: Payer: Self-pay | Admitting: Adult Health

## 2014-03-31 ENCOUNTER — Ambulatory Visit: Payer: 59 | Admitting: Adult Health

## 2014-03-31 NOTE — Telephone Encounter (Signed)
Patient no showed for a revisit appointment.  

## 2014-04-02 ENCOUNTER — Encounter: Payer: Self-pay | Admitting: *Deleted

## 2014-04-07 ENCOUNTER — Telehealth: Payer: Self-pay | Admitting: Adult Health

## 2014-04-07 ENCOUNTER — Ambulatory Visit: Payer: Self-pay | Admitting: Pulmonary Disease

## 2014-04-07 NOTE — Telephone Encounter (Signed)
Patient's wife calling and is confused because they received a letter about missing their appointment on 03/31/14 but she states patient was here and had a CT scan. Please return call and advise.

## 2014-04-08 ENCOUNTER — Encounter: Payer: Self-pay | Admitting: *Deleted

## 2014-04-08 NOTE — Telephone Encounter (Signed)
Called wife back to try to get more information about who ordered the CT scan and where was the CT done at because that is not something we do here in this office. Far as what I can see is that the patient had an appointment here with the NP but did not show.

## 2014-05-05 ENCOUNTER — Other Ambulatory Visit: Payer: Self-pay | Admitting: Neurology

## 2014-05-05 NOTE — Telephone Encounter (Signed)
Patient has not been seen since 03/28/2013.  He cancelled appts on 04/04/13 and 03/19/14.  As well, he no showed appts on 09/26/13 and 03/31/14

## 2014-05-07 ENCOUNTER — Ambulatory Visit (INDEPENDENT_AMBULATORY_CARE_PROVIDER_SITE_OTHER): Payer: 59 | Admitting: Psychiatry

## 2014-05-07 DIAGNOSIS — G478 Other sleep disorders: Secondary | ICD-10-CM | POA: Diagnosis not present

## 2014-05-07 DIAGNOSIS — F0630 Mood disorder due to known physiological condition, unspecified: Secondary | ICD-10-CM

## 2014-05-20 ENCOUNTER — Encounter: Payer: Self-pay | Admitting: Adult Health

## 2014-05-27 ENCOUNTER — Ambulatory Visit (INDEPENDENT_AMBULATORY_CARE_PROVIDER_SITE_OTHER): Payer: 59 | Admitting: Psychiatry

## 2014-05-27 DIAGNOSIS — F064 Anxiety disorder due to known physiological condition: Secondary | ICD-10-CM

## 2014-06-17 ENCOUNTER — Ambulatory Visit (INDEPENDENT_AMBULATORY_CARE_PROVIDER_SITE_OTHER): Payer: 59 | Admitting: Psychiatry

## 2014-06-17 DIAGNOSIS — F064 Anxiety disorder due to known physiological condition: Secondary | ICD-10-CM

## 2014-07-08 ENCOUNTER — Ambulatory Visit (INDEPENDENT_AMBULATORY_CARE_PROVIDER_SITE_OTHER): Payer: 59 | Admitting: Psychiatry

## 2014-07-08 DIAGNOSIS — F064 Anxiety disorder due to known physiological condition: Secondary | ICD-10-CM

## 2014-07-16 ENCOUNTER — Ambulatory Visit (INDEPENDENT_AMBULATORY_CARE_PROVIDER_SITE_OTHER): Payer: 59 | Admitting: Neurology

## 2014-07-16 ENCOUNTER — Encounter: Payer: Self-pay | Admitting: Neurology

## 2014-07-16 VITALS — BP 130/68 | HR 72 | Temp 97.9°F | Resp 16 | Ht 76.0 in | Wt 262.3 lb

## 2014-07-16 DIAGNOSIS — F329 Major depressive disorder, single episode, unspecified: Secondary | ICD-10-CM

## 2014-07-16 DIAGNOSIS — Q984 Klinefelter syndrome, unspecified: Secondary | ICD-10-CM

## 2014-07-16 DIAGNOSIS — R413 Other amnesia: Secondary | ICD-10-CM

## 2014-07-16 DIAGNOSIS — H8190 Unspecified disorder of vestibular function, unspecified ear: Secondary | ICD-10-CM

## 2014-07-16 DIAGNOSIS — Z86718 Personal history of other venous thrombosis and embolism: Secondary | ICD-10-CM

## 2014-07-16 DIAGNOSIS — R251 Tremor, unspecified: Secondary | ICD-10-CM

## 2014-07-16 DIAGNOSIS — F32A Depression, unspecified: Secondary | ICD-10-CM

## 2014-07-16 DIAGNOSIS — G4710 Hypersomnia, unspecified: Secondary | ICD-10-CM

## 2014-07-16 DIAGNOSIS — R42 Dizziness and giddiness: Secondary | ICD-10-CM

## 2014-07-16 DIAGNOSIS — H8120 Vestibular neuronitis, unspecified ear: Secondary | ICD-10-CM

## 2014-07-16 DIAGNOSIS — G43009 Migraine without aura, not intractable, without status migrainosus: Secondary | ICD-10-CM

## 2014-07-16 MED ORDER — PROPRANOLOL HCL ER 120 MG PO CP24: 120.0000 mg | ORAL_CAPSULE | Freq: Every day | ORAL | Status: DC

## 2014-07-16 NOTE — Progress Notes (Signed)
NEUROLOGY CONSULTATION NOTE  DAVIED NOCITO MRN: [id number] DOB: [birth year]  Referring provider: Ramiro Harvest Primary care provider: Ramiro Harvest  Reason for consult:  TIAs  HISTORY OF PRESENT ILLNESS: Timothy [name] is a 44 year old right-handed man with Klinefelter syndrome and major depressive disorder who presents for TIAs, memory problems and hypersomnia.  Records reviewed.  He is accompanied by his wife who provides some history.  A couple of years ago, he was diagnosed with Klinefelter's syndrome after he and his wife were having trouble conceiving their second child.  Since that time, he has increasingly become more depressed and irritable.  He reports short term memory problems.  He needs to write everything down in regards to chores at home or things that need to be done at work.  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  Sometimes, he reports word-finding difficulties for common objects such as the grill.  He has not made any mistakes or has had problems paying the bills and managing the household finances.  He also has been experiencing hypersomnolence.  His work requires him to drive often.  When he is on the road, he becomes some sleepy and fatigued that he has to pull over and nap for 20-30 minutes.  This occurs frequently.  His wife is concerned about Narcolepsy.  He does not exhibit symptoms of cataplexy.  He has had a sleep study performed by Dr. Shelle Iron a couple of years ago, which was reportedly unremarkable.  As per his wife, he does not snore.  He has been taking testosterone injections but it is not clear if his levels are appropriate.    He also has had episodes of dizziness for the past 2 years.  He describes sudden onset spinning sensation that is triggered by bright fluorescent light.  It is associated with bi-frontal non-throbbing headache, which is typical for his headaches.  It is sometimes associated with nausea.  There is no phonophobia.  He sometimes sees spots but  no positive visual aura phenomena.  It lasts 30 minutes and occurs 2 to 3 times a week.  He sometimes takes ibuprofen.  He takes propranolol ER  daily for both headaches and tremor.  He has had headaches for many years and the propranolol has been helpful.    He was previously seen at [facility] for the dizziness and carpal tunnel syndrome.  He was also having pain in his wrists and he was dropping objects.  He says a NCV was done, which confirmed carpal tunnel.  He reportedly had a CT of the head, but this is not available.  He reports that he was told that the dizziness was related to   PAST MEDICAL HISTORY: Past Medical History  Diagnosis Date  . Helicobacter pylori gastritis 06/30/06  . GERD (gastroesophageal reflux disease)   . Chronic headache   . Carpal tunnel syndrome on both sides 03/28/2013  . 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 TIA (transient ischemic attack)     PAST SURGICAL HISTORY: Past Surgical History  Procedure Laterality Date  . Unremarkable      MEDICATIONS: Current Outpatient Prescriptions on File Prior to Visit  Medication Sig Dispense Refill  . testosterone (ANDROGEL) 50 MG/5GM GEL Place 81 g onto the skin daily.    . citalopram (CELEXA) 20 MG tablet Take 1 tablet (20 mg total) by mouth daily. (Patient not taking: Reported on 07/16/2014) 30 tablet 0  . esomeprazole (NEXIUM) 40 MG capsule Take 1  capsule (40 mg total) by mouth 2 (two) times daily. (Patient not taking: Reported on 07/16/2014) 180 capsule 2   No current facility-administered medications on file prior to visit.    ALLERGIES: Allergies  Allergen Reactions  . Ioxaglate   . Ivp Dye [Iodinated Diagnostic Agents]     FAMILY HISTORY: Family History  Problem Relation Age of Onset  . Heart disease    . Barrett's esophagus Mother   . Breast cancer Mother   . Diabetes Paternal Grandfather   . Heart disease Paternal Grandfather   . Irritable bowel syndrome Brother   .  Allergies Brother   . Heart disease Paternal Grandmother   . Colon cancer Neg Hx   . Esophageal cancer Neg Hx     SOCIAL HISTORY: History   Social History  . Marital Status: Married    Spouse Name: N/A  . Number of Children: 1  . Years of Education: N/A   Occupational History  . Full time student    Social History Main Topics  . Smoking status: Never Smoker   . Smokeless tobacco: Never Used  . Alcohol Use: No  . Drug Use: No  . Sexual Activity:    Partners: Female   Other Topics Concern  . Not on file   Social History Narrative    REVIEW OF SYSTEMS: Constitutional: No fevers, chills, or sweats, no generalized fatigue, change in appetite Eyes: No visual changes, double vision, eye pain Ear, nose and throat: No hearing loss, ear pain, nasal congestion, sore throat Cardiovascular: No chest pain, palpitations Respiratory:  No shortness of breath at rest or with exertion, wheezes GastrointestinaI: No nausea, vomiting, diarrhea, abdominal pain, fecal incontinence Genitourinary:  No dysuria, urinary retention or frequency Musculoskeletal:  No neck pain, back pain Integumentary: No rash, pruritus, skin lesions Neurological: as above Psychiatric: No depression, insomnia, anxiety Endocrine: No palpitations, fatigue, diaphoresis, mood swings, change in appetite, change in weight, increased thirst Hematologic/Lymphatic:  No anemia, purpura, petechiae. Allergic/Immunologic: no itchy/runny eyes, nasal congestion, recent allergic reactions, rashes  PHYSICAL EXAM: Filed Vitals:   07/16/14 1019  BP: 130/68  Pulse: 72  Temp: 97.9 F (36.6 C)  Resp: 16   General: No acute distress Head:  Normocephalic/atraumatic Eyes:  fundi unremarkable, without vessel changes, exudates, hemorrhages or papilledema. Neck: supple, no paraspinal tenderness, full range of motion Back: No paraspinal tenderness Heart: regular rate and rhythm Lungs: Clear to auscultation bilaterally. Vascular:  No carotid bruits. Neurological Exam: Mental status: alert and oriented to person, place, and time, recent and remote memory intact, fund of knowledge intact, attention and concentration intact, speech fluent and not dysarthric, language intact. MMSE - Mini Mental State Exam 07/16/2014  Orientation to time 5  Orientation to Place 5  Registration 3  Attention/ Calculation 4  Recall 2  Language- name 2 objects 2  Language- repeat 1  Language- follow 3 step command 3  Language- read & follow direction 1  Write a sentence 1  Copy design 1  Total score 28  One of the serial 7 subtraction calculations was off by one digit. Cranial nerves: CN I: not tested CN II: pupils equal, round and reactive to light, visual fields intact, fundi unremarkable, without vessel changes, exudates, hemorrhages or papilledema. CN III, IV, VI:  full range of motion, no nystagmus, no ptosis CN V: facial sensation intact CN VII: upper and lower face symmetric CN VIII: hearing intact CN IX, X: gag intact, uvula midline CN XI: sternocleidomastoid and trapezius muscles intact  CN XII: tongue midline Bulk & Tone: normal, no fasciculations. Motor:  5/5 throughout Sensation:  Pinprick and vibration intact Deep Tendon Reflexes:  2+ throughout, toes downgoing Finger to nose testing:  Mild fine postural and kinetic tremor.  No dysmetria Heel to shin:  No dysmetria Gait:  Normal station and stride.  Able to turn and walk in tandem. Romberg negative.  IMPRESSION: Recurrent episodic vertigo.  In association with headache, it is likely migraine.  I do not believe these are TIAs. Memory deficits.  I don't suspect any organic cognitive impairment.  Likely related to depression Depression Tremor Hypersomnolence.  I don't suspect narcolepsy.  It could be related to OSA, low testosterone, depression.  PLAN: 1.  I changed the propranolol ER from  to .  Take 1 capsule daily.  Watch out for lightheadedness.  Keep  headache diary and call in 4 weeks with update. 2.  We will refer you for neuropsychological testing for memory 3.  Follow up with Dr. Shelle Iron regarding sleepiness 4.  Will get notes from [facility].  Follow up in 3 months.  Thank you for allowing me to take part in the care of this patient.  Shon Millet, DO  CC:  Denice Bors, NP  Marcelyn Bruins, MD

## 2014-07-16 NOTE — Patient Instructions (Signed)
1.  I changed the propranolol ER from 80mg  to 120mg .  Take 1 capsule daily.  Watch out for lightheadedness.  Keep headache diary and call in 4 weeks with update. 2.  We will refer you for neuropsychological testing for memory 3.  Follow up with Dr. Shelle Ironlance regarding sleepiness 4.  Will get [facility] from [facility].  Follow up in 3 months.

## 2014-07-23 ENCOUNTER — Ambulatory Visit: Payer: 59 | Admitting: Psychiatry

## 2014-07-23 ENCOUNTER — Telehealth: Payer: Self-pay | Admitting: Pulmonary Disease

## 2014-07-23 ENCOUNTER — Encounter: Payer: Self-pay | Admitting: Neurology

## 2014-07-23 NOTE — Telephone Encounter (Signed)
Called and spoke to pt. Appt made with [facility] on 08/05/2014. Pt stated he would have his Neurologist fax over the OV note from last visit. Pt verbalized understanding and denied any further questions or concerns at this time.

## 2014-08-05 ENCOUNTER — Ambulatory Visit (INDEPENDENT_AMBULATORY_CARE_PROVIDER_SITE_OTHER): Payer: 59 | Admitting: Pulmonary Disease

## 2014-08-05 ENCOUNTER — Encounter: Payer: Self-pay | Admitting: Pulmonary Disease

## 2014-08-05 VITALS — BP 124/70 | HR 60 | Temp 97.6°F | Ht 76.0 in | Wt 261.8 lb

## 2014-08-05 DIAGNOSIS — G479 Sleep disorder, unspecified: Secondary | ICD-10-CM

## 2014-08-05 NOTE — Assessment & Plan Note (Signed)
The patient continues to have loud snoring, nonrestorative sleep, and significant daytime sleepiness with inactivity. He is concerned about sleepiness while driving, and I have reminded him of his more responsibility to not drive if he is sleepy. He has had a sleep study in 2012 that showed no sleep disordered breathing, but has gained weight and continues to have significant symptoms. He has also taken his oxygen level first thing in the morning when he awakens and it has been in the 80s. At this point, I think he needs a follow-up sleep study, and he would like to do a home study since he believes he will sleep better in that environment. If this does not show sleep disordered breathing, he may need an MS LT in order to further workup his daytime sleepiness.

## 2014-08-05 NOTE — Patient Instructions (Signed)
Will arrange for home sleep testing, and call once the results are available. Work on weight reduction.

## 2014-08-05 NOTE — Progress Notes (Signed)
   Subjective:    Patient ID: Timothy ChimesJason L [name], male    DOB: 01-Jun-1971, 44 y.o.   MRN: [id number]  HPI Patient comes in today for reevaluation of sleep disruption and inappropriate daytime sleepiness. He was last seen in 2012 for this issue, and a study at the sleep Center showed no sleep disordered breathing or other etiology for his symptoms. I have asked him to work aggressively on weight loss, to avoid the supine position during sleep, and to make an assessment of his sleep environment for anything that may disrupt his sleep. He comes in today where he continues to have snoring, nonrestorative sleep, and feels that his daytime sleepiness has worsened. He has actually gained 8 pounds since the last visit, and is now concerned about sleepiness with driving. He is also checked his oxygen level in the wee hours of the morning, and tells me they're sometimes in the 80s.   Review of Systems  Constitutional: Negative for fever and unexpected weight change.  HENT: Negative for congestion, dental problem, ear pain, nosebleeds, postnasal drip, rhinorrhea, sinus pressure, sneezing, sore throat and trouble swallowing.   Eyes: Negative for redness and itching.  Respiratory: Negative for cough, chest tightness, shortness of breath and wheezing.   Cardiovascular: Negative for palpitations and leg swelling.  Gastrointestinal: Negative for nausea and vomiting.  Genitourinary: Negative for dysuria.  Musculoskeletal: Negative for joint swelling.  Skin: Negative for rash.  Neurological: Negative for headaches.  Hematological: Does not bruise/bleed easily.  Psychiatric/Behavioral: Negative for dysphoric mood. The patient is not nervous/anxious.        Objective:   Physical Exam Obese male in no acute distress Nose without purulence or discharge noted Neck without lymphadenopathy or thyromegaly Lower extremities with minimal edema, no cyanosis Appears sleepy, but appropriate, moves all 4  extremities.       Assessment & Plan:

## 2014-08-10 ENCOUNTER — Encounter: Payer: Self-pay | Admitting: Neurology

## 2014-08-22 ENCOUNTER — Other Ambulatory Visit: Payer: Self-pay | Admitting: *Deleted

## 2014-08-22 MED ORDER — PROPRANOLOL HCL ER 120 MG PO CP24: 120.0000 mg | ORAL_CAPSULE | Freq: Every day | ORAL | Status: DC

## 2014-08-28 ENCOUNTER — Encounter: Payer: Self-pay | Admitting: Internal Medicine

## 2014-08-28 ENCOUNTER — Ambulatory Visit (INDEPENDENT_AMBULATORY_CARE_PROVIDER_SITE_OTHER): Payer: 59 | Admitting: Internal Medicine

## 2014-08-28 VITALS — BP 106/76 | HR 60 | Ht 75.0 in | Wt 260.2 lb

## 2014-08-28 DIAGNOSIS — K219 Gastro-esophageal reflux disease without esophagitis: Secondary | ICD-10-CM

## 2014-08-28 MED ORDER — OMEPRAZOLE 40 MG PO CPDR: 40.0000 mg | DELAYED_RELEASE_CAPSULE | Freq: Two times a day (BID) | ORAL | Status: DC

## 2014-08-28 NOTE — Progress Notes (Signed)
Timothy [name] 01-14-1971 [id number]  Note: This dictation was prepared with Dragon digital system. Any transcriptional errors that result from this procedure are unintentional.   History of Present Illness: This is a 44 year old white male with chronic gastroesophageal reflux tried on multiple PPIs. The best one for him is Nexium 40 mg twice a day. Unfortunately his insurance doesn't cover so he was tried on Protonix which didn't seem to be effective and subsequently used over-the-counter Prevacid, Prilosec 20 mg in Nexium 20 mg OTC. He would like to get a refill on omeprazole 40 mg twice a day which seemed to work. He is complaining of daily heartburn but no dysphagia. He has nocturnal cough  when he lays down. Prior upper endoscopies in 2008 showed H. pylori and in December 2012 showed no evidence of Barrett's esophagus. This is his yearly follow-up    Past Medical History  Diagnosis Date  . Helicobacter pylori gastritis 06/30/06  . GERD (gastroesophageal reflux disease)   . Chronic headache   . Carpal tunnel syndrome on both sides 03/28/2013  . 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 TIA (transient ischemic attack)     Past Surgical History  Procedure Laterality Date  . Unremarkable      Allergies  Allergen Reactions  . Ioxaglate   . Ivp Dye [Iodinated Diagnostic Agents]     Family history and social history have been reviewed.  Review of Systems: Positive for heartburn. Indigestion, negative for dysphagia or chest pain  The remainder of the 10 point ROS is negative except as outlined in the H&P  Physical Exam: General Appearance Well developed, in no distress Eyes  Non icteric  HEENT  Non traumatic, normocephalic  Mouth No lesion, tongue papillated, no cheilosis Neck Supple without adenopathy, thyroid not enlarged, no carotid bruits, no JVD Lungs Clear to auscultation bilaterally COR Normal S1, normal S2, regular rhythm, no murmur, quiet precordium Abdomen soft  nontender not done Rectal not done Extremities  No pedal edema Skin No lesions Neurological Alert and oriented x 3 Psychological Normal mood and affect  Assessment and Plan:   44 year old white male with symptomatic gastroesophageal reflux but no prior evidence of Barrett's esophagus. Last endoscopy 4 years ago. We will try him on omeprazole 40 mg twice a day since he has a good experience with it. We may  have to appeal Nexium denial if the omeprazole doesn't work. He needs to continue his antireflux measures including head of the bed elevation and weight loss. I will see him in 1 year    Lina SarDora Brodie 08/28/2014

## 2014-08-28 NOTE — Patient Instructions (Addendum)
We have sent the following medications to your pharmacy for you to pick up at your convenience:  Omeprazole  Dr Barron Alvineavid Gibson

## 2014-09-03 ENCOUNTER — Other Ambulatory Visit: Payer: Self-pay | Admitting: *Deleted

## 2014-09-03 ENCOUNTER — Telehealth: Payer: Self-pay | Admitting: *Deleted

## 2014-09-03 DIAGNOSIS — G43009 Migraine without aura, not intractable, without status migrainosus: Secondary | ICD-10-CM

## 2014-09-03 MED ORDER — TOPIRAMATE 25 MG PO TABS: 25.0000 mg | ORAL_TABLET | Freq: Every day | ORAL | Status: DC

## 2014-09-03 NOTE — Telephone Encounter (Signed)
I would stop the propranolol.  He can start topiramate 25mg  at bedtime for migraine.  Possible side effects include: impaired thinking, sedation, paresthesias (numbness and tingling) and weight loss.  It may cause dehydration and there is a small risk for kidney stones, so make sure to stay hydrated with water during the day.  He needs to give it 4 weeks, after which he may call with update and we can adjust dose if needed.

## 2014-09-03 NOTE — Telephone Encounter (Signed)
-----   Message from Drema DallasAdam R Jaffe, DO sent at 09/02/2014  3:10 PM EDT ----- As per suggestion by the neuropsychologist, would like to get MRI of the brain without contrast to evaluate for cognitive impairment.

## 2014-09-03 NOTE — Telephone Encounter (Signed)
Patient's wife states patient has had to cut back on the Inderal from 120 to 80 his heart rate dropped to 46 and having dizziness  Really bad as well as he is still having headaches She is asking is there something else he can take? Please advise

## 2014-09-03 NOTE — Telephone Encounter (Signed)
Patient having side affects from change in his medication. Please advise Call back number [phone]

## 2014-09-03 NOTE — Telephone Encounter (Signed)
I spoke with patient wife Herbert SetaHeather adn Toprimate was called to pharmacy she is aware of the following        Expand All Collapse All   I would stop the propranolol. He can start topiramate 25mg  at bedtime for migraine. Possible side effects include: impaired thinking, sedation, paresthesias (numbness and tingling) and weight loss. It may cause dehydration and there is a small risk for kidney stones, so make sure to stay hydrated with water during the day. He needs to give it 4 weeks, after which he may call with update and we can adjust dose if needed.

## 2014-09-03 NOTE — Telephone Encounter (Signed)
Patient's spouse is aware and agrees with having the MRI .

## 2014-09-04 ENCOUNTER — Other Ambulatory Visit: Payer: Self-pay | Admitting: *Deleted

## 2014-09-04 ENCOUNTER — Telehealth: Payer: Self-pay | Admitting: *Deleted

## 2014-09-04 DIAGNOSIS — R42 Dizziness and giddiness: Secondary | ICD-10-CM

## 2014-09-04 DIAGNOSIS — H8190 Unspecified disorder of vestibular function, unspecified ear: Secondary | ICD-10-CM

## 2014-09-04 NOTE — Telephone Encounter (Signed)
I spoke with patient's wife Timothy [name]  [facility]

## 2014-09-05 ENCOUNTER — Other Ambulatory Visit: Payer: Self-pay | Admitting: *Deleted

## 2014-09-05 MED ORDER — DIAZEPAM 10 MG PO TABS: 10.0000 mg | ORAL_TABLET | Freq: Once | ORAL | Status: DC

## 2014-09-08 ENCOUNTER — Telehealth: Payer: Self-pay | Admitting: Neurology

## 2014-09-08 NOTE — Telephone Encounter (Signed)
Spouse states patient is having tremors really bad he was unable to write a check this am .Please advise

## 2014-09-08 NOTE — Telephone Encounter (Signed)
Sorry the phone number is [phone]

## 2014-09-08 NOTE — Telephone Encounter (Signed)
He can try primidone 25mg  at bedtime.

## 2014-09-08 NOTE — Telephone Encounter (Signed)
Incorrect telephone number.

## 2014-09-08 NOTE — Telephone Encounter (Signed)
Pt wife Timothy NeuHeather Spickler called and needs to talk to someone about husband tremors that are so bad that he can not write please call [phone]

## 2014-09-09 ENCOUNTER — Other Ambulatory Visit: Payer: Self-pay | Admitting: *Deleted

## 2014-09-09 MED ORDER — PRIMIDONE 50 MG PO TABS: 25.0000 mg | ORAL_TABLET | Freq: Every day | ORAL | Status: DC

## 2014-09-09 NOTE — Telephone Encounter (Signed)
Patient's wife is aware that primidone 25mg  at bedtime. Has been called into pharmacy

## 2014-09-11 DIAGNOSIS — G4730 Sleep apnea, unspecified: Secondary | ICD-10-CM | POA: Diagnosis not present

## 2014-09-17 ENCOUNTER — Telehealth: Payer: Self-pay | Admitting: Pulmonary Disease

## 2014-09-17 NOTE — Telephone Encounter (Signed)
Pt calling for sleep study results.  Pt had home sleep test last Thursday.    KC please advise on sleep study results.  Thanks!

## 2014-09-17 NOTE — Telephone Encounter (Signed)
Called # provided and was advised pt was at home Called home [facility]

## 2014-09-17 NOTE — Telephone Encounter (Signed)
Let him know will look at in am or Friday am and call him.

## 2014-09-18 ENCOUNTER — Telehealth: Payer: Self-pay | Admitting: Pulmonary Disease

## 2014-09-18 ENCOUNTER — Other Ambulatory Visit: Payer: Self-pay | Admitting: *Deleted

## 2014-09-18 DIAGNOSIS — G4730 Sleep apnea, unspecified: Secondary | ICD-10-CM | POA: Diagnosis not present

## 2014-09-18 DIAGNOSIS — G479 Sleep disorder, unspecified: Secondary | ICD-10-CM

## 2014-09-18 NOTE — Telephone Encounter (Signed)
Pt returned call to Mindy. Pt made an appointment on 4/27 at 9:30. Nothing further needed at this time.

## 2014-09-18 NOTE — Telephone Encounter (Signed)
Mindy, the pt needs ov to review his sleep study.  Let him know this study did well.  Thanks.

## 2014-09-18 NOTE — Telephone Encounter (Signed)
Per KC pt needs OV to review sleep study results See phone note 09/18/14.  Barbaraann ShareKeith M Clance, MD at 09/18/2014 9:28 AM     Status: Signed       Expand All Collapse All   Celisse Ciulla, the pt needs ov to review his sleep study. Let him know this study did well. Thanks.      ---  Called pt and LMTCB x1

## 2014-09-18 NOTE — Telephone Encounter (Signed)
Pt has [facility] to [facility] Wife states that she was not made aware of this and when she asked her husband if he has scheduled an appt he states that he has not spoken with anyone and is not aware of any appts.  Wife is concerned that his memory is worsening and she would like a sooner appt. Pt has a MRI on 09/22/14 at 10am - wife is asking if they can be fit in sometime that morning either before or after MRI.   Please advise Dr Shelle Ironlance. Thanks.

## 2014-09-18 NOTE — Telephone Encounter (Signed)
See phone note 09/17/14.

## 2014-09-19 ENCOUNTER — Telehealth: Payer: Self-pay | Admitting: Pulmonary Disease

## 2014-09-19 NOTE — Telephone Encounter (Signed)
Called spouse and made aware he has nothing available at the end of Monday morning but can be seen in the afternoon. appt scheduled for 1:45. Nothing further needed

## 2014-09-22 ENCOUNTER — Ambulatory Visit (HOSPITAL_COMMUNITY)
Admission: RE | Admit: 2014-09-22 | Discharge: 2014-09-22 | Disposition: A | Discharge status: Home / Self Care | Payer: 59 | Source: Ambulatory Visit | Attending: Neurology | Admitting: Neurology

## 2014-09-22 ENCOUNTER — Encounter: Payer: Self-pay | Admitting: Pulmonary Disease

## 2014-09-22 ENCOUNTER — Ambulatory Visit (INDEPENDENT_AMBULATORY_CARE_PROVIDER_SITE_OTHER): Payer: 59 | Admitting: Pulmonary Disease

## 2014-09-22 ENCOUNTER — Ambulatory Visit (HOSPITAL_COMMUNITY): Payer: 59

## 2014-09-22 ENCOUNTER — Telehealth: Payer: Self-pay | Admitting: *Deleted

## 2014-09-22 VITALS — BP 122/70 | HR 56 | Temp 97.9°F | Ht 76.0 in | Wt 261.8 lb

## 2014-09-22 DIAGNOSIS — R5383 Other fatigue: Secondary | ICD-10-CM | POA: Insufficient documentation

## 2014-09-22 DIAGNOSIS — R413 Other amnesia: Secondary | ICD-10-CM | POA: Diagnosis not present

## 2014-09-22 DIAGNOSIS — G478 Other sleep disorders: Secondary | ICD-10-CM | POA: Diagnosis not present

## 2014-09-22 DIAGNOSIS — R251 Tremor, unspecified: Secondary | ICD-10-CM | POA: Diagnosis not present

## 2014-09-22 DIAGNOSIS — R42 Dizziness and giddiness: Secondary | ICD-10-CM | POA: Diagnosis present

## 2014-09-22 DIAGNOSIS — H8190 Unspecified disorder of vestibular function, unspecified ear: Secondary | ICD-10-CM

## 2014-09-22 NOTE — Assessment & Plan Note (Signed)
The patient's home sleep study did not meet the criteria for the obstructive sleep apnea syndrome, however he did have an AHI of 2.4 events per hour with desaturation as low as 86%. He has a history of very loud snoring, nonrestorative sleep, and daytime sleepiness. My suspicion is that he has the upper airway resistance syndrome, and would benefit from treatment with a positive pressure device. I would find it very unlikely that he has narcolepsy or idiopathic hypersomnia, given his history at night with sleep. I will see if I am able to get him a trial of C Pap, and also encouraged him to work aggressively on weight loss.

## 2014-09-22 NOTE — Telephone Encounter (Signed)
-----   Message from Drema DallasAdam R Jaffe, DO sent at 09/22/2014  2:43 PM EDT ----- MRI of the brain looks unremarkable and does not show anything that would be contributing to cognitive impairment. ----- Message -----    From: Rad Results In Interface    Sent: 09/22/2014   1:51 PM      To: Drema DallasAdam R Jaffe, DO

## 2014-09-22 NOTE — Progress Notes (Signed)
   Subjective:    Patient ID: Timothy ChimesJason L Welty, male    DOB: 03/04/1971, 44 y.o.   MRN: [id number]  HPI The patient comes in today for follow-up of his recent home sleep test. He was found to have an AHI of 2.4 events per hour and oxygen desaturation as low as 86%. He did spend 44 minutes less than 90% during the night. I have reviewed the study with him in detail, and answered all of his questions.   Review of Systems  Constitutional: Negative for fever and unexpected weight change.  HENT: Negative for congestion, dental problem, ear pain, nosebleeds, postnasal drip, rhinorrhea, sinus pressure, sneezing, sore throat and trouble swallowing.   Eyes: Negative for redness and itching.  Respiratory: Negative for cough, chest tightness, shortness of breath and wheezing.   Cardiovascular: Negative for palpitations and leg swelling.  Gastrointestinal: Negative for nausea and vomiting.  Genitourinary: Negative for dysuria.  Musculoskeletal: Negative for joint swelling.  Skin: Negative for rash.  Neurological: Negative for headaches.  Hematological: Does not bruise/bleed easily.  Psychiatric/Behavioral: Negative for dysphoric mood. The patient is not nervous/anxious.        Objective:   Physical Exam Overweight male in no acute distress Nose without purulence or discharge noted Neck without lymphadenopathy or thyromegaly Lower extremities without significant edema, no cyanosis Awake, but does appear mildly sleepy, moves all 4 extremities.      Assessment & Plan:

## 2014-09-22 NOTE — Telephone Encounter (Signed)
Patient's wife Herbert SetaHeather is aware that MRI of the brain looks unremarkable and does not show anything that would be contributing to cognitive impairment.

## 2014-09-22 NOTE — Patient Instructions (Signed)
Will see if we can get a cpap device for you to try for the upper airway resistance syndrome. If we are successful, would like to see you back 4 weeks after starting.  Work on weight loss.

## 2014-10-01 ENCOUNTER — Ambulatory Visit: Payer: Self-pay | Admitting: Pulmonary Disease

## 2014-10-13 ENCOUNTER — Encounter: Payer: Self-pay | Admitting: Pulmonary Disease

## 2014-10-13 DIAGNOSIS — G479 Sleep disorder, unspecified: Secondary | ICD-10-CM

## 2014-10-13 NOTE — Telephone Encounter (Signed)
KC please advise. Thanks. 

## 2014-10-13 NOTE — Telephone Encounter (Signed)
Please let pt know that we will order ONO, but I want him to do the study OFF CPAP.   Will then re-group and decide where we need to go from there.   Mindy, ok to order ONO for him off cpap.  See if we can get ASAP so we can make some decisions about his management going forward. Thanks.

## 2014-10-15 ENCOUNTER — Ambulatory Visit (INDEPENDENT_AMBULATORY_CARE_PROVIDER_SITE_OTHER): Payer: 59 | Admitting: Neurology

## 2014-10-15 ENCOUNTER — Encounter: Payer: Self-pay | Admitting: Neurology

## 2014-10-15 VITALS — BP 104/70 | HR 74 | Resp 18 | Ht 76.0 in | Wt 258.2 lb

## 2014-10-15 DIAGNOSIS — R413 Other amnesia: Secondary | ICD-10-CM

## 2014-10-15 DIAGNOSIS — G43009 Migraine without aura, not intractable, without status migrainosus: Secondary | ICD-10-CM | POA: Insufficient documentation

## 2014-10-15 DIAGNOSIS — G250 Essential tremor: Secondary | ICD-10-CM | POA: Insufficient documentation

## 2014-10-15 DIAGNOSIS — R42 Dizziness and giddiness: Secondary | ICD-10-CM

## 2014-10-15 DIAGNOSIS — G478 Other sleep disorders: Secondary | ICD-10-CM | POA: Diagnosis not present

## 2014-10-15 DIAGNOSIS — H8120 Vestibular neuronitis, unspecified ear: Secondary | ICD-10-CM

## 2014-10-15 DIAGNOSIS — H8190 Unspecified disorder of vestibular function, unspecified ear: Secondary | ICD-10-CM

## 2014-10-15 MED ORDER — PROPRANOLOL HCL ER 80 MG PO CP24
80.0000 mg | ORAL_CAPSULE | Freq: Every day | ORAL | Status: DC
Start: 2014-10-15 — End: 2015-05-07

## 2014-10-15 NOTE — Patient Instructions (Signed)
1.  Stop primidone and restart propranolol ER 80mg  daily 2.  Continue topiramate 25mg  at bedtime for headache 3.  Follow up in 3 months.

## 2014-10-15 NOTE — Progress Notes (Addendum)
NEUROLOGY FOLLOW UP OFFICE NOTE  Timothy [name] [id number]  HISTORY OF PRESENT ILLNESS: Timothy [name] is a 44 year old right-handed man with Klinefelter syndrome and major depressive disorder who presents for vertigo, migraines, tremor, memory problems and hypersomnia.  Records from pulmonologist, neuropsych report and MRI of brain reviewed.  UPDATE: Overall, he is feeling better, especially in regards to his headaches.  [name] underwent neuropsychological testing with Dr. Elesa Massed on 08/14/14.  He demonstrated deficits in verbal fluency, visual attention, and memory retrieval and recognition in presence of minimal hypersomnolence.  Profile raised concern for "possible suboptimal performance maximal to aspects of the bilateral frontal and temporal lobes".  Possible contributing factors may include fatigue, or sleep disorder.  Adjustment disorder with anxiety and depression is possible, but thought not to be the primary factor based on pattern of test performance.  MRI of the brain performed 09/22/14 showed mild nonspecific white matter hyperintensities but otherwise unremarkable.  He had a home sleep test in April which revealed transient oxygen desaturation associated with obstructive events, with low saturation of 86%, however he did not meet AHI criteria for OSA.  He had a trial with CPAP which didn't seem effective.  His pulse ox is to be monitored.  We increased his propranolol ER (which he takes for both tremor and headache) from  to .  He began feeling dizzy with low heart rate.  Also headaches worsened.  He was advised to stop propranolol and he was started on topiramate  daily.  He noted increased tremor since discontinuing propranolol, so he was started on primidone  at bedtime.  This has not been effective.   For migraines and vertigo, he was started on topiramate  daily, which has been effective.  HISTORY: Timothy [name] is a 44 year old right-handed man with  Klinefelter syndrome and major depressive disorder who presents for TIAs, memory problems and hypersomnia.  Records reviewed.  He is accompanied by his wife who provides some history.  A couple of years ago, he was diagnosed with Klinefelter's syndrome after he and his wife were having trouble conceiving their second child.  Since that time, he has increasingly become more depressed and irritable.  He reports short term memory problems.  He needs to write everything down in regards to chores at home or things that need to be done at work.  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  Sometimes, he reports word-finding difficulties for common objects such as the grill.  He has not made any mistakes or has had problems paying the bills and managing the household finances.  He also has been experiencing hypersomnolence.  His work requires him to drive often.  When he is on the road, he becomes some sleepy and fatigued that he has to pull over and nap for 20-30 minutes.  This occurs frequently.    He also has had episodes of dizziness for the past 2 years.  He describes sudden onset spinning sensation that is triggered by bright fluorescent light.  It is associated with bi-frontal non-throbbing headache, which is typical for his headaches.  It is sometimes associated with nausea.  There is no phonophobia.  He sometimes sees spots but no positive visual aura phenomena.  It lasts 30 minutes and occurs 2 to 3 times a week.  He sometimes takes ibuprofen.  He takes propranolol ER  daily for both headaches and tremor.  He has had headaches for many years and the propranolol has  been helpful in the past.     PAST MEDICAL HISTORY: Past Medical History  Diagnosis Date  . Helicobacter pylori gastritis 06/30/06  . GERD (gastroesophageal reflux disease)   . Chronic headache   . Carpal tunnel syndrome on both sides 03/28/2013  . 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 TIA (transient ischemic attack)      MEDICATIONS: Current Outpatient Prescriptions on File Prior to Visit  Medication Sig Dispense Refill  . diazepam (VALIUM) 10 MG tablet Take 1 tablet (10 mg total) by mouth once. 1 tablet 0  . omeprazole (PRILOSEC) 40 MG capsule Take 1 capsule (40 mg total) by mouth 2 (two) times daily. 180 capsule 3  . PARoxetine (PAXIL) 20 MG tablet Take 20 mg by mouth daily.    Marland Kitchen. testosterone cypionate (DEPOTESTOTERONE CYPIONATE) 200 MG/ML injection Every 2 weeks    . tobramycin-dexamethasone (TOBRADEX) ophthalmic solution Place 1 drop into both eyes 2 (two) times daily.    Marland Kitchen. topiramate (TOPAMAX) 25 MG tablet Take 1 tablet (25 mg total) by mouth daily. 30 tablet 0   No current facility-administered medications on file prior to visit.    ALLERGIES: Allergies  Allergen Reactions  . Ioxaglate   . Ivp Dye [Iodinated Diagnostic Agents]     FAMILY HISTORY: Family History  Problem Relation Age of Onset  . Heart disease    . Barrett's esophagus Mother   . Breast cancer Mother   . Diabetes Paternal Grandfather   . Heart disease Paternal Grandfather   . Irritable bowel syndrome Brother   . Allergies Brother   . Heart disease Paternal Grandmother   . Colon cancer Neg Hx   . Esophageal cancer Neg Hx     SOCIAL HISTORY: History   Social History  . Marital Status: Married    Spouse Name: N/A  . Number of Children: 1  . Years of Education: N/A   Occupational History  . Full time student    Social History Main Topics  . Smoking status: Never Smoker   . Smokeless tobacco: Never Used  . Alcohol Use: No  . Drug Use: No  . Sexual Activity:    Partners: Female   Other Topics Concern  . Not on file   Social History Narrative    REVIEW OF SYSTEMS: Constitutional: No fevers, chills, or sweats, no generalized fatigue, change in appetite Eyes: No visual changes, double vision, eye pain Ear, nose and throat: No hearing loss, ear pain, nasal congestion, sore throat Cardiovascular: No  chest pain, palpitations Respiratory:  No shortness of breath at rest or with exertion, wheezes GastrointestinaI: No nausea, vomiting, diarrhea, abdominal pain, fecal incontinence Genitourinary:  No dysuria, urinary retention or frequency Musculoskeletal:  No neck pain, back pain Integumentary: No rash, pruritus, skin lesions Neurological: as above Psychiatric: No depression, insomnia, anxiety Endocrine: No palpitations, fatigue, diaphoresis, mood swings, change in appetite, change in weight, increased thirst Hematologic/Lymphatic:  No anemia, purpura, petechiae. Allergic/Immunologic: no itchy/runny eyes, nasal congestion, recent allergic reactions, rashes  PHYSICAL EXAM: Filed Vitals:   10/15/14 0920  BP: 104/70  Pulse: 74  Resp: 18   General: No acute distress Head:  Normocephalic/atraumatic Eyes:  Fundoscopic exam unremarkable without vessel changes, exudates, hemorrhages or papilledema. Neck: supple, no paraspinal tenderness, full range of motion Heart:  Regular rate and rhythm Lungs:  Clear to auscultation bilaterally Back: No paraspinal tenderness Neurological Exam: alert and oriented to person, place, and time. Attention span and concentration intact, recent and remote memory intact, fund of knowledge  intact.  Speech fluent and not dysarthric, language intact.  CN II-XII intact. Fundoscopic exam unremarkable without vessel changes, exudates, hemorrhages or papilledema.  Bulk and tone normal, muscle strength 5/5 throughout.  Sensation to light touch, temperature and vibration intact.  Deep tendon reflexes 2+ throughout.  Finger to nose and heel to shin testing intact.  Gait normal.  IMPRESSION: Recurrent vertigo associated with headaches.  Possibly migraine with migraine associated vertigo Memory deficits.  Underlying mental disorder, not otherwise specified.  Etiology unclear.  Sleep problems may still be an issue Tremor Hypersomnolence  PLAN: 1.  Stop primidone and restart  propranolol ER at 80mg  daily, as his tremor was better controlled at this dose. 2.  Continue topiramate 25mg  at bedtime 3.  Will likely repeat neuropsych testing in December 4.  Follow up in 3 months.  Shon MilletAdam Jaffe, DO  CC:  Barron Alvineavid Gibson

## 2014-10-26 ENCOUNTER — Encounter: Payer: Self-pay | Admitting: Pulmonary Disease

## 2014-10-26 ENCOUNTER — Encounter: Payer: Self-pay | Admitting: Neurology

## 2014-10-27 NOTE — Telephone Encounter (Signed)
ONO is in KC'S GREEN folder. Please advise thanks

## 2014-10-27 NOTE — Telephone Encounter (Signed)
Let pt know that his ONO showed minimal decrease in his oxygen level that is very transient.  He only spent one minute the entire night less than or equal to 88%.  This has nothing to do with any of his symptoms.   I would like to see him back in the office to regroup and address his day time sleepiness.  He needs to bring his machine with card with him so that we can get a download off his device.

## 2014-10-27 NOTE — Telephone Encounter (Signed)
Called spoke with spouse. She is aware of ONO results. She reports pt had a CPAP loaner x 2 weeks and was returned 2 weeks ago. Pt used [employer]. I called [employer] to have download faxed over. Had to leave a message. [facility] Will await download

## 2014-10-28 NOTE — Telephone Encounter (Signed)
Download received. Nothing further needed

## 2014-10-29 ENCOUNTER — Ambulatory Visit (INDEPENDENT_AMBULATORY_CARE_PROVIDER_SITE_OTHER): Payer: 59 | Admitting: Pulmonary Disease

## 2014-10-29 ENCOUNTER — Encounter: Payer: Self-pay | Admitting: Pulmonary Disease

## 2014-10-29 VITALS — BP 122/70 | HR 59 | Temp 98.6°F | Ht 76.0 in | Wt 256.8 lb

## 2014-10-29 DIAGNOSIS — G478 Other sleep disorders: Secondary | ICD-10-CM

## 2014-10-29 DIAGNOSIS — G4710 Hypersomnia, unspecified: Secondary | ICD-10-CM

## 2014-10-29 MED ORDER — ARMODAFINIL 150 MG PO TABS: 150.0000 mg | ORAL_TABLET | Freq: Every day | ORAL | Status: DC

## 2014-10-29 NOTE — Patient Instructions (Signed)
You need to work on getting at least 6-7 hrs of sleep a night.  This is very important. I will see if we can get a cpap device covered by insurance Trial of nuvigil 150mg  one a day for your sleepiness until we can figure this out.  No driving if sleepy Work on weight loss. Will call once we figure out about the cpap.  You may need to have a daytime sleep study to evaluate further.  Will arrange followup with Dr. Craige CottaSood in 6 weeks to at least get something established on the books.  This may change depending on cpap.

## 2014-10-29 NOTE — Assessment & Plan Note (Signed)
The patient has persistent daytime sleepiness that I think is multifactorial.  I clearly think he has the upper airway resistant syndrome, and did notice a difference with 2 weeks of a C Pap trial. He also has issues with inadequate sleep hygiene, getting only 5 hours of sleep a night because of doing work late at night. He is having significant daytime sleepiness that is interfering with his work, and also having sleepiness with driving. I cannot exclude the possibility of him having narcolepsy or idiopathic hypersomnia, but will need to work on these other issues first. I'm going to see if we can petition his insurance to get him a C Pap device to use for 3-6 months, and he has made a promise to me that he will work very hard on getting more sleep at night. Because of his severe sleepiness during the day, I will start him on Nuvigil, and he understands that he may need to come off of this for a few weeks if further testing is needed. He also understands that he needs to work aggressively on weight loss.

## 2014-10-29 NOTE — Assessment & Plan Note (Signed)
I continue to believe the patient has the upper airway resistant syndrome. She did have a trial of C Pap for 2 weeks, and his wife states that his sleep was much improved. The patient also saw some improvement in his daytime alertness, but not a major change. However, his download shows that he will device 4 hours or more for only 7 days, and less than 4 hours for another 7 days. His average usage time was only 4 hours. I will see if I can petition his insurance company to get a C Pap device for a longer treatment trial, to see the impact to his daytime sleepiness over time.

## 2014-10-29 NOTE — Progress Notes (Signed)
   Subjective:    Patient ID: Timothy [name], male    DOB: 06/18/1970, 44 y.o.   MRN: [id number]  HPI The patient comes in today for follow-up of his known hypersomnia. At the last visit, he was felt to have the upper airway resistant syndrome, and was treated with a trial of C Pap. Unfortunately, he can only have for 2 weeks from the DME company, and his wife saw significant improvement in his sleep quality. He did see some improvement in his daytime sleepiness, but did not have for a long enough time to be a major change. He also only used the device an average of 4 hours a night. Unfortunately, he is continuing to stay up late doing his work, and is only getting about 5 hours of sleep a night on average. Since being off the C Pap device, the wife states his snoring has gotten much more severe with restless sleep.   Review of Systems  Constitutional: Negative for fever and unexpected weight change.  HENT: Negative for congestion, dental problem, ear pain, nosebleeds, postnasal drip, rhinorrhea, sinus pressure, sneezing, sore throat and trouble swallowing.   Eyes: Negative for redness and itching.  Respiratory: Negative for cough, chest tightness, shortness of breath and wheezing.   Cardiovascular: Negative for palpitations and leg swelling.  Gastrointestinal: Negative for nausea and vomiting.  Genitourinary: Negative for dysuria.  Musculoskeletal: Negative for joint swelling.  Skin: Negative for rash.  Neurological: Negative for headaches.  Hematological: Does not bruise/bleed easily.  Psychiatric/Behavioral: Negative for dysphoric mood. The patient is not nervous/anxious.        Objective:   Physical Exam Obese male in no acute distress Nose without purulence or discharge noted No skin breakdown or pressure necrosis from the C Pap mask Lower extremities without edema, no cyanosis Awake, but appears sleepy, moves all 4 extremities.       Assessment & Plan:

## 2014-11-10 NOTE — Telephone Encounter (Signed)
Pt called in regards to headaches he is having/Dawn [phone]

## 2014-11-13 ENCOUNTER — Telehealth: Payer: Self-pay | Admitting: Neurology

## 2014-11-13 NOTE — Telephone Encounter (Signed)
Pt's wife called and stated that she has left a message for someone to call her and also left a message on my chart and no one has called her back/Daw CB# [phone]

## 2014-11-13 NOTE — Telephone Encounter (Signed)
Sumatriptan 100mg  tablet.  Take 1 tablet at earliest onset of headache.  May repeat 1tablet in 2 hours if needed.  He should not exceed 2 tablets in 24 hours.

## 2014-11-13 NOTE — Telephone Encounter (Signed)
Patient having really bad migraine headaches and would like to have something to take when he does. Patients wife states he does not get them often but they are unbearable when they do come please advise

## 2014-11-14 ENCOUNTER — Other Ambulatory Visit: Payer: Self-pay | Admitting: *Deleted

## 2014-11-14 MED ORDER — SUMATRIPTAN SUCCINATE 100 MG PO TABS: 100.0000 mg | ORAL_TABLET | ORAL | Status: DC | PRN

## 2014-11-14 NOTE — Telephone Encounter (Signed)
Rx sent to pharmacy   

## 2014-11-24 ENCOUNTER — Telehealth: Payer: Self-pay | Admitting: Pulmonary Disease

## 2014-11-24 NOTE — Telephone Encounter (Signed)
Received Staff Message:  ----- Message -----   From: Tommie Sams, CMA   Sent: 11/24/2014  8:51 AM    To: Lilian Kapur  Subject: RE: Dr. Shelle Iron Letter or Petition the [employer]*   We will need to get the ladies upfront to take a message on this. I have not seen anything and we would have to send this to another sleep provider onc done. thanks  ----- Message -----   From: Lilian Kapur   Sent: 11/20/2014  5:29 PM    To: Tommie Sams, CMA  Subject: Dr. Shelle Iron Letter or Petition the Insurance *   Ms. Mansouri called today wanting to know if Dr. Shelle Iron had done his letter to the Insurance Co. To petition for him to get a CPAP machine. Insurance Co. Want pay for him to get one because he doesn't qualify. See Below. Call Marinell Blight at [phone] with any information that you may have.    Thanks,  Synetta Fail   Per KC:  I continue to believe the patient has the upper airway resistant syndrome. She did have a trial of C Pap for 2 weeks, and his wife states that his sleep was much improved. The patient also saw some improvement in his daytime alertness, but not a major change. However, his download shows that he will device 4 hours or more for only 7 days, and less than 4 hours for another 7 days. His average usage time was only 4 hours. I will see if I can petition his insurance company to get a C Pap device for a longer treatment trial, to see the impact to his daytime sleepiness over time.

## 2014-11-24 NOTE — Telephone Encounter (Signed)
Mindy, can you clarify what is needing to be handled in this message?  Thanks!

## 2014-11-24 NOTE — Telephone Encounter (Signed)
Pcc's is a doctor needing to do a letter for what [facility] wanted? thanks

## 2014-11-24 NOTE — Telephone Encounter (Signed)
It looks like there was an order sent to [employer] to see what needed to be done to get cpap approved so we can call them tomorrow and ck with them to see [phone] Tobe Sos

## 2014-11-25 NOTE — Telephone Encounter (Signed)
Pt's ahi was 2.9 on npsg [facility] said no, does not quialfy for cpap any suggestions for this pt he has appt to see you on 12/09/14 Tobe Sos

## 2014-11-26 NOTE — Telephone Encounter (Signed)
Insurance will not cover CPAP since his AHI was 2.9.   Any suggestions.  Please advise.

## 2014-11-28 NOTE — Telephone Encounter (Signed)
Timothy [name]  spoke to wife and offered to go ahead and set pt up they just had to sign a waiver incase [facility] did not pay , wife has chosen to wait until they see dr Craige Cotta 12/09/14 to see what he suggest Timothy [name]

## 2014-11-28 NOTE — Telephone Encounter (Signed)
Noted  

## 2014-12-09 ENCOUNTER — Ambulatory Visit (INDEPENDENT_AMBULATORY_CARE_PROVIDER_SITE_OTHER): Payer: 59 | Admitting: Pulmonary Disease

## 2014-12-09 ENCOUNTER — Encounter: Payer: Self-pay | Admitting: Pulmonary Disease

## 2014-12-09 VITALS — BP 112/80 | HR 54 | Ht 76.0 in | Wt 243.4 lb

## 2014-12-09 DIAGNOSIS — G4710 Hypersomnia, unspecified: Secondary | ICD-10-CM | POA: Diagnosis not present

## 2014-12-09 MED ORDER — ARMODAFINIL 150 MG PO TABS: 150.0000 mg | ORAL_TABLET | Freq: Every day | ORAL | Status: DC

## 2014-12-09 NOTE — Patient Instructions (Signed)
Will arrange for sleep study Nuvigil 150 mg daily as needed to maintain alertness Follow up in 4 months

## 2014-12-09 NOTE — Progress Notes (Signed)
Chief Complaint  Patient presents with  . Follow-up    [facility]. Pt states that insurance is not wanting to pay for CPAP machine - requests letter of necessity to try for coverage.     History of Present Illness: Timothy ChimesJason L [name] is a 44 y.o. male with hypersomnia.  He was previously seen by Dr. Shelle Ironlance.  He had in lab sleep study in 2012 which showed few respiratory events, and home sleep study in April 2016 which again showed few respiratory events.  He did not meet dx criteria for sleep apnea.  He was tried on CPAP at home for 2 weeks.  His sleep was tremendously better, and his alertness was significantly improved.  Unfortunately he could not get insurance coverage for CPAP with dx of UARS.  He has been using nuvigil and this helps.   TESTS:   Past medical hx  >> GERD, HA, TIA  Past surgical hx, Medications, Allergies, Family hx, Social hx all reviewed.   Physical Exam:  General - No distress ENT - No sinus tenderness, no oral exudate, no LAN Cardiac - s1s2 regular, no murmur Chest - No wheeze/rales/dullness Back - No focal tenderness Abd - Soft, non-tender Ext - No edema Neuro - Normal strength Skin - No rashes Psych - normal mood, and behavior   Assessment/Plan:  Hypersomnia. He has significant improvement with PAP therapy, and likely has sleep disordered breathing as contributor for this. Plan: - will arrange for repeat in lab sleep study to further assess for presence of sleep apnea - will refill his nuvigil - he might need further assessment for narcolepsy   Coralyn HellingVineet Advit Trethewey, MD [facility] Pager:  [phone]

## 2014-12-23 ENCOUNTER — Encounter: Payer: Self-pay | Admitting: Adult Health

## 2014-12-26 ENCOUNTER — Ambulatory Visit (HOSPITAL_COMMUNITY): Payer: Self-pay | Admitting: Psychiatry

## 2014-12-31 ENCOUNTER — Ambulatory Visit (INDEPENDENT_AMBULATORY_CARE_PROVIDER_SITE_OTHER): Payer: 59 | Admitting: Psychiatry

## 2014-12-31 ENCOUNTER — Encounter (HOSPITAL_COMMUNITY): Payer: Self-pay | Admitting: Psychiatry

## 2014-12-31 VITALS — BP 123/74 | HR 65 | Ht 76.0 in | Wt 245.0 lb

## 2014-12-31 DIAGNOSIS — F411 Generalized anxiety disorder: Secondary | ICD-10-CM | POA: Insufficient documentation

## 2014-12-31 DIAGNOSIS — F322 Major depressive disorder, single episode, severe without psychotic features: Secondary | ICD-10-CM | POA: Diagnosis not present

## 2014-12-31 DIAGNOSIS — F329 Major depressive disorder, single episode, unspecified: Secondary | ICD-10-CM

## 2014-12-31 DIAGNOSIS — F32A Depression, unspecified: Secondary | ICD-10-CM

## 2014-12-31 DIAGNOSIS — G4710 Hypersomnia, unspecified: Secondary | ICD-10-CM | POA: Insufficient documentation

## 2014-12-31 DIAGNOSIS — F321 Major depressive disorder, single episode, moderate: Secondary | ICD-10-CM

## 2014-12-31 MED ORDER — ZOLPIDEM TARTRATE ER 12.5 MG PO TBCR: 12.5000 mg | EXTENDED_RELEASE_TABLET | Freq: Every evening | ORAL | Status: DC | PRN

## 2014-12-31 MED ORDER — PAROXETINE HCL 30 MG PO TABS: 30.0000 mg | ORAL_TABLET | Freq: Every day | ORAL | Status: DC

## 2014-12-31 NOTE — Progress Notes (Signed)
Psychiatric Initial Adult Assessment   Patient Identification: Timothy [name] MRN:  [id number] Date of Evaluation:  12/31/2014 Referral Source: Self Chief Complaint:   depression anxiety Visit Diagnosis:    ICD-9-CM ICD-10-CM   1. Generalized anxiety disorder 300.02 F41.1   2. Major depression, chronic 296.20 F32.2   3. Hypersomnolence disorder, acute, moderate 780.54 G47.10    Diagnosis:   Patient Active Problem List   Diagnosis Date Noted  . Generalized anxiety disorder [F41.1] 12/31/2014    Priority: High  . Major depression, chronic [F32.2] 12/31/2014    Priority: High  . Hypersomnolence disorder, acute, moderate [G47.10] 12/31/2014    Priority: High  . Migraine without aura and without status migrainosus, not intractable [G43.009] 10/15/2014  . Essential tremor [G25.0] 10/15/2014  . Upper airway resistance syndrome [G47.8] 09/22/2014  . Carpal tunnel syndrome on both sides [G56.01, G56.02] 03/28/2013  . 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 Pleuritic chest pain [R07.81] 11/22/2011  . Cough [R05] 11/22/2011  . Hypersomnia [G47.10] 11/05/2010   History of Present Illness:  44 year old white married male father of 2 kids  who works at [employer] was seen today along with his wife and with his permission. He, presents with depression anxiety irritability because of multiple stressors out of which his work is the biggest stressor. Patient states that his job is very stressful as he is a third Insurance underwriter and work still late and his schedule is extremely unpredictable. They also have 76 children 59-year-old and a 16 month old which has also been stressful.  Patient states that he is having difficulty remembering things as he worries about everything, he has an upcoming sleep study, has severe anxiety and constantly ruminates about everything, denies panic attacks. States that he brings his work home and has to stay up late documenting and this affects his sleep he  does not sleep well and does not feel rested and is tired during the day and has a hard time waking up. Appetite is good mood tends to be irritable, he is upset at having to work so hard and feels that his employers do not understand his stressors. Denies feeling hopeless or helpless and denies suicidal or homicidal ideation. He denies hallucinations or delusions.  States that he does not smoke cigarettes use alcohol or marijuana does drink 2 cans of soda every day. Patient was seen at this office before by nurse practitioner Megin Blankman. States that she put him on Celexa which helped initially but after 6 months he quit that he did not follow-up because he did not remember and so was discharged. Subsequently he states that his PCP Dr. Mary Sella at [facility] primary care put him on Paxil 20 mg which she says helps a little but not completely. Patient has multiple medical problems for which she receives treatment.   Associated Signs/Symptoms: Depression Symptoms:  depressed mood, anhedonia, hypersomnia, psychomotor retardation, fatigue, feelings of worthlessness/guilt, difficulty concentrating, impaired memory, anxiety, loss of energy/fatigue, disturbed sleep, (Hypo) Manic Symptoms:  None Anxiety Symptoms:  Excessive Worry, Psychotic Symptoms:  None PTSD Symptoms: NA  Past Medical History: Essential tremor, migraine, GERD, history of TIA. Problems with hearing, Mosaic Klinefelter syndrome. Past Medical History  Diagnosis Date  . Helicobacter pylori gastritis 06/30/06  . GERD (gastroesophageal reflux disease)   . Chronic headache   . Carpal tunnel syndrome on both sides 03/28/2013  . 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 TIA (transient ischemic attack)     Past Surgical History  Procedure  Laterality Date  . Unremarkable     Family History: Brother and sister have depression Family History  Problem Relation Age of Onset  . Heart disease    . Barrett's esophagus  Mother   . Breast cancer Mother   . Diabetes Paternal Grandfather   . Heart disease Paternal Grandfather   . Irritable bowel syndrome Brother   . Allergies Brother   . Heart disease Paternal Grandmother   . Colon cancer Neg Hx   . Esophageal cancer Neg Hx    Social History:  Patient lives in [place] saw met with his wife and 2 children aged 5 years and 52 months. He has a good relationship with his wife History   Social History  . Marital Status: Married    Spouse Name: N/A  . Number of Children: 1  . Years of Education: N/A   Occupational History  . Full time student    Social History Main Topics  . Smoking status: Never Smoker   . Smokeless tobacco: Never Used  . Alcohol Use: No  . Drug Use: No  . Sexual Activity:    Partners: Female   Other Topics Concern  . None   Social History Narrative     Musculoskeletal: Strength & Muscle Tone: within normal limits Gait & Station: normal, Normal Patient leans: Stands straight  Psychiatric Specialty Exam: HPI  Review of Systems  Constitutional: Positive for malaise/fatigue. Negative for fever, chills, weight loss and diaphoresis.  HENT: Positive for hearing loss. Negative for congestion, ear discharge, ear pain, nosebleeds, sore throat and tinnitus.   Eyes: Negative for blurred vision, double vision, photophobia and pain.  Respiratory: Negative for cough, hemoptysis, sputum production, wheezing and stridor.   Cardiovascular: Negative for chest pain, palpitations, orthopnea, claudication, leg swelling and PND.  Gastrointestinal: Positive for heartburn. Negative for nausea, vomiting, abdominal pain, diarrhea, constipation and blood in stool.  Genitourinary: Negative for dysuria, urgency, hematuria and flank pain.  Musculoskeletal: Negative for myalgias, back pain, joint pain, falls and neck pain.  Skin: Negative for itching and rash.  Neurological: Positive for tremors and headaches. Negative for dizziness, tingling, sensory  change, speech change, focal weakness, seizures, loss of consciousness and weakness.  Endo/Heme/Allergies: Negative for environmental allergies and polydipsia. Does not bruise/bleed easily.  Psychiatric/Behavioral: Positive for depression. The patient is nervous/anxious.     Blood pressure 123/74, pulse 65, height 6' 4"  (1.93 m), weight 245 lb (111.131 kg).Body mass index is 29.83 kg/(m^2).  General Appearance: Casual  Eye Contact:  Good  Speech:  Clear and Coherent and Normal Rate  Volume:  Normal  Mood:  Anxious, Depressed and Dysphoric  Affect:  Constricted  Thought Process:  Goal Directed, Linear and Logical  Orientation:  Full (Time, Place, and Person)  Thought Content:  Rumination  Suicidal Thoughts:  No  Homicidal Thoughts:  No  Memory:  Immediate;   Fair Recent;   Poor Remote;   Poor  Judgement:  Good  Insight:  Good  Psychomotor Activity:  Normal  Concentration:  Fair  Recall:  [place] of Knowledge:Fair  Language: Good  Akathisia:  No  Handed:  Right  AIMS (if indicated):  0  Assets:  Communication Skills Desire for Improvement Physical Health Resilience Social Support Transportation  ADL's:  Intact  Cognition: WNL  Sleep:  =5hrs   Is the patient at risk to self?  No. Has the patient been a risk to self in the past 6 months?  No. Has the patient been  a risk to self within the distant past?  No. Is the patient a risk to others?  No. Has the patient been a risk to others in the past 6 months?  No. Has the patient been a risk to others within the distant past?  No.  Allergies:   Allergies  Allergen Reactions  . Ioxaglate   . Ivp Dye [Iodinated Diagnostic Agents]    Current Medications: Current Outpatient Prescriptions  Medication Sig Dispense Refill  . Armodafinil (NUVIGIL) 150 MG tablet Take 1 tablet (150 mg total) by mouth daily. 30 tablet 3  . omeprazole (PRILOSEC) 40 MG capsule Take 1 capsule (40 mg total) by mouth 2 (two) times daily. 180 capsule  3  . PARoxetine (PAXIL) 30 MG tablet Take 1 tablet (30 mg total) by mouth daily. 30 tablet 2  . propranolol ER (INDERAL LA) 80 MG 24 hr capsule Take 1 capsule (80 mg total) by mouth daily. 30 capsule 5  . SUMAtriptan (IMITREX) 100 MG tablet Take 1 tablet (100 mg total) by mouth every 2 (two) hours as needed for migraine. May repeat in 2 hours if headache persists or recurs. 10 tablet 2  . testosterone cypionate (DEPOTESTOTERONE CYPIONATE) 200 MG/ML injection Every 2 weeks    . topiramate (TOPAMAX) 25 MG tablet Take 1 tablet (25 mg total) by mouth daily. 30 tablet 0  . zolpidem (AMBIEN CR) 12.5 MG CR tablet Take 1 tablet (12.5 mg total) by mouth at bedtime as needed for sleep. 30 tablet 2   No current facility-administered medications for this visit.    Previous Psychotropic Medications: Yes   Substance Abuse History in the last 12 months:  No.  Consequences of Substance Abuse: NA  Medical Decision Making:  Review of Psycho-Social Stressors (1), Review or order clinical lab tests (1), Decision to obtain old records (1), Review and summation of old records (2), New Problem, with no additional work-up planned (3), Review of Medication Regimen & Side Effects (2) and Review of New Medication or Change in Dosage (2)  Treatment Plan Summary: Medication management #1 Maj. depression chronic Patient will be continued on Paxil which will be increased to 30 mg by mouth every p.m. #2 Generalized anxiety disorder Will be treated with Paxil CBT for anxiety was discussed with relaxation techniques and cognitive restructuring of his cognitive distortions. #3 insomnia Discussed rationale risks benefits of Ambien and patient gave informed consent. Patient will be started on Ambien 12.5 mg by mouth daily at bedtime. Sleep hygiene was discussed in great detail. With the patient doing his paperwork between 8:30 to 10:30 and then taking his medications at 10:15 and going to bed by 10:30 PM. #4  hypersomnolence disorder Patient was asked to take his Nuvigil at 6 AM and return back to sleep so than he would be able to breakup. #5 labs   will obtain CBC CMP TSH T4 hemoglobin A1c lipid panel. #6 patient will return to see me in the clinic in 2 weeks. Call sooner if necessary. More than 50% of the time was spent in explaining the role of medications and CBT for his anxiety and discussing sleep hygiene and medication compliance and making a schedule every day and following the schedule in detail. This visit was of high intensity.  Erin Sons 7/27/201612:46 PM

## 2014-12-31 NOTE — Addendum Note (Signed)
Addended by: Margit Banda D on: 12/31/2014 01:07 PM   Modules accepted: Orders

## 2015-01-14 ENCOUNTER — Ambulatory Visit (INDEPENDENT_AMBULATORY_CARE_PROVIDER_SITE_OTHER): Payer: 59 | Admitting: Psychiatry

## 2015-01-14 ENCOUNTER — Encounter (HOSPITAL_COMMUNITY): Payer: Self-pay | Admitting: Psychiatry

## 2015-01-14 VITALS — BP 110/70 | HR 64 | Ht 76.0 in | Wt 241.6 lb

## 2015-01-14 DIAGNOSIS — F321 Major depressive disorder, single episode, moderate: Secondary | ICD-10-CM | POA: Diagnosis not present

## 2015-01-14 DIAGNOSIS — G4710 Hypersomnia, unspecified: Secondary | ICD-10-CM | POA: Diagnosis not present

## 2015-01-14 DIAGNOSIS — F411 Generalized anxiety disorder: Secondary | ICD-10-CM

## 2015-01-14 NOTE — Progress Notes (Signed)
[facility] progress note  Patient Identification: Timothy [name] MRN:  [id number] Date of Evaluation:  01/14/2015 Chief Complaint:   depression anxiety Visit Diagnosis:    ICD-9-CM ICD-10-CM   1. Major depressive disorder, single episode, moderate 296.22 F32.1   2. Generalized anxiety disorder 300.02 F41.1   3. Hypersomnolence disorder, acute, mild 780.54 G47.10    Diagnosis:   Patient Active Problem List   Diagnosis Date Noted  . Generalized anxiety disorder [F41.1] 12/31/2014    Priority: High  . Major depression, chronic [F32.2] 12/31/2014    Priority: High  . Hypersomnolence disorder, acute, moderate [G47.10] 12/31/2014    Priority: High  . Migraine without aura and without status migrainosus, not intractable [G43.009] 10/15/2014  . Essential tremor [G25.0] 10/15/2014  . Upper airway resistance syndrome [G47.8] 09/22/2014  . Carpal tunnel syndrome on both sides [G56.01, G56.02] 03/28/2013  . 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 Pleuritic chest pain [R07.81] 11/22/2011  . Cough [R05] 11/22/2011  . Hypersomnia [G47.10] 11/05/2010   Assessment  patient seen today with his wife, with the patient's permission, states that he was doing the sleep hygiene religiously and was taking the Paxil and Ambien and that was helping him. Patient was able to sleep which decreases irritability and anxiety.   Patient states that he was doing well till his boss decided to hound him about a week ago. It became very stressful he was anxious had diarrhea and so they complained to upper administration who then took action against the [facility] and asked him to settle down which has helped significantly.  Patient states he feels much better now and supported his sleep is good the Ambien helps him sleep, his schedule was disrupted so he is not going to sleep at 10:30 PM mood is significantly improved his mood swings and irritability has decreased denies feeling hopeless or helpless denies panic attacks  no suicidal or homicidal ideation and no hallucinations or delusions.  Sleep hygiene was discussed again and staying away from caffeine and decreasing the caffeine was discussed. It was also discussed that if they get into and are given patient should take a break and take a walk he stated understanding and is willing to dose. His tolerating his medications well and is coping well.  The family is planning to go on vacation to the beach.     Associated Signs/Symptoms: Depression Symptoms:   Disturbed sleep, Anxiety, fatigue, Difficulty concentrating Mildly depressed mood  (Hypo) Manic Symptoms:  None Anxiety Symptoms:  Excessive Worry, Psychotic Symptoms:  None PTSD Symptoms: NA  Past Medical History: Essential tremor, migraine, GERD, history of TIA. Problems with hearing, Mosaic Klinefelter syndrome. Past Medical History  Diagnosis Date  . Helicobacter pylori gastritis 06/30/06  . GERD (gastroesophageal reflux disease)   . Chronic headache   . Carpal tunnel syndrome on both sides 03/28/2013  . 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 TIA (transient ischemic attack)     Past Surgical History  Procedure Laterality Date  . Unremarkable     Family History: Brother and sister have depression Family History  Problem Relation Age of Onset  . Heart disease    . Barrett's esophagus Mother   . Breast cancer Mother   . Diabetes Paternal Grandfather   . Heart disease Paternal Grandfather   . Irritable bowel syndrome Brother   . Allergies Brother   . Heart disease Paternal Grandmother   . Colon cancer Neg Hx   . Esophageal cancer Neg Hx  Social History:  Patient lives in [place] saw met with his wife and 2 children aged 24 years and 109 months. He has a good relationship with his wife Social History   Social History  . Marital Status: Married    Spouse Name: N/A  . Number of Children: 1  . Years of Education: N/A   Occupational History  . Full time student    Social  History Main Topics  . Smoking status: Never Smoker   . Smokeless tobacco: Never Used  . Alcohol Use: No  . Drug Use: No  . Sexual Activity:    Partners: Female   Other Topics Concern  . None   Social History Narrative     Musculoskeletal: Strength & Muscle Tone: within normal limits Gait & Station: normal, Normal Patient leans: Stands straight  Psychiatric Specialty Exam: HPI  Review of Systems  Constitutional: Positive for malaise/fatigue. Negative for fever, chills, weight loss and diaphoresis.  HENT: Positive for hearing loss. Negative for congestion, ear discharge, ear pain, nosebleeds, sore throat and tinnitus.   Eyes: Negative for blurred vision, double vision, photophobia and pain.  Respiratory: Negative for cough, hemoptysis, sputum production, wheezing and stridor.   Cardiovascular: Negative for chest pain, palpitations, orthopnea, claudication, leg swelling and PND.  Gastrointestinal: Positive for heartburn. Negative for nausea, vomiting, abdominal pain, diarrhea, constipation and blood in stool.  Genitourinary: Negative for dysuria, urgency, hematuria and flank pain.  Musculoskeletal: Negative for myalgias, back pain, joint pain, falls and neck pain.  Skin: Negative for itching and rash.  Neurological: Positive for tremors. Negative for dizziness, tingling, sensory change, speech change, focal weakness, seizures, loss of consciousness and weakness.  Endo/Heme/Allergies: Negative for environmental allergies and polydipsia. Does not bruise/bleed easily.  Psychiatric/Behavioral: Positive for depression. The patient has insomnia.     Blood pressure 110/70, pulse 64, height 6' 4"  (1.93 m), weight 241 lb 9.6 oz (109.589 kg).Body mass index is 29.42 kg/(m^2).  General Appearance: Casual  Eye Contact:  Good  Speech:  Clear and Coherent and Normal Rate  Volume:  Normal  Mood:  Anxious and depressed   Affect:  Appropriate   Thought Process:  Goal Directed, Linear and  Logical  Orientation:  Full (Time, Place, and Person)  Thought Content:  Rumination  Suicidal Thoughts:  No  Homicidal Thoughts:  No  Memory:  Immediate is good, recent is good remote is good   Judgement:  Good  Insight:  Good  Psychomotor Activity:  Normal  Concentration:  Improved significantly   Recall:  Good   Fund of Knowledge:Fair  Language: Good  Akathisia:  No  Handed:  Right  AIMS (if indicated):  0  Assets:  Communication Skills Desire for Improvement Physical Health Resilience Social Support Transportation  ADL's:  Intact  Cognition: WNL  Sleep:  =5hrs   Is the patient at risk to self?  No. Has the patient been a risk to self in the past 6 months?  No. Has the patient been a risk to self within the distant past?  No. Is the patient a risk to others?  No. Has the patient been a risk to others in the past 6 months?  No. Has the patient been a risk to others within the distant past?  No.  Allergies:   Allergies  Allergen Reactions  . Ioxaglate   . Ivp Dye [Iodinated Diagnostic Agents]    Current Medications: Current Outpatient Prescriptions  Medication Sig Dispense Refill  . Armodafinil (NUVIGIL) 150 MG tablet  Take 1 tablet (150 mg total) by mouth daily. 30 tablet 3  . omeprazole (PRILOSEC) 40 MG capsule Take 1 capsule (40 mg total) by mouth 2 (two) times daily. 180 capsule 3  . PARoxetine (PAXIL) 30 MG tablet Take 1 tablet (30 mg total) by mouth daily. 30 tablet 2  . propranolol ER (INDERAL LA) 80 MG 24 hr capsule Take 1 capsule (80 mg total) by mouth daily. 30 capsule 5  . SUMAtriptan (IMITREX) 100 MG tablet Take 1 tablet (100 mg total) by mouth every 2 (two) hours as needed for migraine. May repeat in 2 hours if headache persists or recurs. 10 tablet 2  . testosterone cypionate (DEPOTESTOTERONE CYPIONATE) 200 MG/ML injection Every 2 weeks    . topiramate (TOPAMAX) 25 MG tablet Take 1 tablet (25 mg total) by mouth daily. 30 tablet 0  . zolpidem (AMBIEN CR)  12.5 MG CR tablet Take 1 tablet (12.5 mg total) by mouth at bedtime as needed for sleep. 30 tablet 2   No current facility-administered medications for this visit.    Previous Psychotropic Medications: Yes   Substance Abuse History in the last 12 months:  No.  Consequences of Substance Abuse: NA  Medical Decision Making:  Review of Psycho-Social Stressors (1), Review or order clinical lab tests (1), Decision to obtain old records (1), Review and summation of old records (2), New Problem, with no additional work-up planned (3), Review of Medication Regimen & Side Effects (2) and Review of New Medication or Change in Dosage (2)  Treatment Plan Summary: Medication management #1 Maj. depression chronic Patient will be continued on Paxil  30 mg by mouth every p.m. #2 Generalized anxiety disorder Will be treated with Paxil CBT for anxiety was discussed with relaxation techniques and cognitive restructuring of his cognitive distortions. #3 insomnia Treat with Ambien 12.5 mg by mouth daily at bedtime. Sleep hygiene was discussed in great detail. With the patient doing his paperwork between 8:30 to 10:30 and then taking his medications at 10:15 and going to bed by 10:30 PM. #4 hypersomnolence disorder Patient was asked to take his Nuvigil at 6 AM and return back to sleep so than he would be able to breakup. #5 labs   will obtain CBC CMP TSH T4 hemoglobin A1c lipid panel. #6 patient will return to see me in the clinic in 6 weeks. Call sooner if necessary. More than 50% of the time was spent in explaining the role of medications and CBT for his anxiety and discussing sleep hygiene and medication compliance and making a schedule every day and following the schedule in detail. This visit was of high intensity.  Erin Sons 8/10/201612:29 PM

## 2015-01-30 ENCOUNTER — Ambulatory Visit (HOSPITAL_BASED_OUTPATIENT_CLINIC_OR_DEPARTMENT_OTHER): Payer: 59 | Attending: Pulmonary Disease

## 2015-01-30 DIAGNOSIS — G478 Other sleep disorders: Secondary | ICD-10-CM | POA: Diagnosis not present

## 2015-01-30 DIAGNOSIS — G4719 Other hypersomnia: Secondary | ICD-10-CM | POA: Insufficient documentation

## 2015-01-30 DIAGNOSIS — R0683 Snoring: Secondary | ICD-10-CM | POA: Diagnosis present

## 2015-01-30 DIAGNOSIS — G4710 Hypersomnia, unspecified: Secondary | ICD-10-CM

## 2015-02-03 ENCOUNTER — Telehealth: Payer: Self-pay | Admitting: Pulmonary Disease

## 2015-02-03 DIAGNOSIS — G478 Other sleep disorders: Secondary | ICD-10-CM | POA: Diagnosis not present

## 2015-02-03 NOTE — Telephone Encounter (Signed)
PSG 01/30/15 >> AHI 1.1, SpO2 low 90%, UARS.  Will have my nurse call to schedule ROV to review test results.

## 2015-02-03 NOTE — Progress Notes (Signed)
Patient Name: Timothy [name], Timothy [name] Date: 01/30/2015 Gender: Male D.O.B: Oct 23, 1970 Age (years): 65 Referring Provider: Coralyn Helling MD, ABSM Height (inches): 76 Interpreting Physician: Coralyn Helling MD, ABSM Weight (lbs): 240 RPSGT: Melburn Popper BMI: 29 MRN: [id number] Neck Size: 18.00  CLINICAL INFORMATION Sleep Study Type: NPSG  Indication for sleep study: Evaluation of snoring, sleep disruption, and daytime sleepiness.  Epworth Sleepiness Score: 16  SLEEP STUDY TECHNIQUE As per the AASM Manual for the Scoring of Sleep and Associated Events v2.3 (April 2016) with a hypopnea requiring 4% desaturations.  The channels recorded and monitored were frontal, central and occipital EEG, electrooculogram (EOG), submentalis EMG (chin), nasal and oral airflow, thoracic and abdominal wall motion, anterior tibialis EMG, snore microphone, electrocardiogram, and pulse oximetry.  MEDICATIONS Patient's medications include: reviewed in electronic medical record. Medications self-administered by patient during sleep study : No sleep medicine administered.  SLEEP ARCHITECTURE The study was initiated at 10:08:53 PM and ended at 5:01:13 AM.  Sleep onset time was 84.8 minutes and the sleep efficiency was 77.0%. The total sleep time was 317.5 minutes.  Stage REM latency was 72.0 minutes.  The patient spent 2.52% of the night in stage N1 sleep, 63.62% in stage N2 sleep, 0.00% in stage N3 and 33.86% in REM.  Alpha intrusion was absent.  Supine sleep was 60.31%.  RESPIRATORY PARAMETERS The overall apnea/hypopnea index (AHI) was 1.1 per hour. There were 4 total apneas, including 1 obstructive, 3 central and 0 mixed apneas. There were 2 hypopneas and 5 RERAs.  The AHI during Stage REM sleep was 2.8 per hour.  AHI while supine was 1.6 per hour.  The mean oxygen saturation was 95.21%. The minimum SpO2 during sleep was 90.00%.  Moderate snoring was noted during this study.  CARDIAC DATA The 2 lead  EKG demonstrated sinus rhythm. The mean heart rate was 51.59 beats per minute. Other EKG findings include: None.  LEG MOVEMENT DATA The total PLMS were 0 with a resulting PLMS index of 0.00. Associated arousal with leg movement index was 0.0 .  IMPRESSIONS While he had several respiratory events, these were not frequent enough to meet criteria for diagnosis of sleep apnea (AHI = 1.1/h, CAI = 0.6/h). He had truncation of airflow, and this would be consistent with upper airway resistance syndrome.  DIAGNOSIS Upper airway resistance syndrome (ICD 10 G47.8)  RECOMMENDATIONS Avoid alcohol, sedatives and other CNS depressants that may worsen sleep apnea and disrupt normal sleep architecture. Sleep hygiene should be reviewed to assess factors that may improve sleep quality. Weight management and regular exercise should be initiated or continued if appropriate.   Coralyn Helling, MD, ABSM Diplomate, American Board of Sleep Medicine 02/03/2015, 11:57 AM  NPI: [id number]

## 2015-02-04 NOTE — Telephone Encounter (Signed)
[id number] x1 

## 2015-02-04 NOTE — Telephone Encounter (Signed)
Patient's wife called, please call her [phone].  May leave message.  States husband has terrible memory problem.

## 2015-02-04 NOTE — Telephone Encounter (Signed)
Called spoke with spouse. appt scheduled for tomorrow at 9 AM with VS

## 2015-02-05 ENCOUNTER — Ambulatory Visit: Payer: Self-pay | Admitting: Neurology

## 2015-02-05 ENCOUNTER — Ambulatory Visit: Payer: Self-pay | Admitting: Pulmonary Disease

## 2015-02-12 ENCOUNTER — Ambulatory Visit (INDEPENDENT_AMBULATORY_CARE_PROVIDER_SITE_OTHER): Payer: 59 | Admitting: Neurology

## 2015-02-12 ENCOUNTER — Encounter: Payer: Self-pay | Admitting: Neurology

## 2015-02-12 VITALS — BP 100/62 | HR 63 | Temp 98.1°F | Resp 16 | Ht 76.0 in | Wt 236.6 lb

## 2015-02-12 DIAGNOSIS — G43009 Migraine without aura, not intractable, without status migrainosus: Secondary | ICD-10-CM | POA: Diagnosis not present

## 2015-02-12 DIAGNOSIS — G250 Essential tremor: Secondary | ICD-10-CM

## 2015-02-12 DIAGNOSIS — R42 Dizziness and giddiness: Secondary | ICD-10-CM

## 2015-02-12 MED ORDER — ELETRIPTAN HYDROBROMIDE 40 MG PO TABS: ORAL_TABLET | ORAL | Status: DC

## 2015-02-12 MED ORDER — TOPIRAMATE 50 MG PO TABS: 50.0000 mg | ORAL_TABLET | Freq: Every day | ORAL | Status: DC

## 2015-02-12 NOTE — Progress Notes (Signed)
NEUROLOGY FOLLOW UP OFFICE NOTE  Timothy [name] [id number]  HISTORY OF PRESENT ILLNESS: Timothy [name] is a 44 year old right-handed man with Klinefelter syndrome and major depressive disorder who presents for vertigo, migraines, tremor, memory problems and hypersomnia.    UPDATE: Migraines: Occurs 1 to 2 days per week.  Easily treated with sumatriptan 50 to 100mg , but causes sleepiness. Current abortive medication:  Sumatriptan 50 to 100mg  Antihypertensive medications:  propranolol ER 80mg  Antidepressant medications:  Paxil Anticonvulsant medications:  topiramate 25mg   Tremors are stable.  He has asked if we can refer him to a center to help with an opioid addiction.  HISTORY: Timothy [name] is a 44 year old right-handed man with Klinefelter syndrome and major depressive disorder who presents for TIAs, memory problems and hypersomnia.  Records reviewed.  He is accompanied by his wife who provides some history.  A couple of years ago, he was diagnosed with Klinefelter's syndrome after he and his wife were having trouble conceiving their second child.  Since that time, he has increasingly become more depressed and irritable.  He reports short term memory problems.  He needs to write everything down in regards to chores at home or things that need to be done at work.  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  Sometimes, he reports word-finding difficulties for common objects such as the grill.  He has not made any mistakes or has had problems paying the bills and managing the household finances.  He also has been experiencing hypersomnolence.  His work requires him to drive often.  When he is on the road, he becomes some sleepy and fatigued that he has to pull over and nap for 20-30 minutes.  This occurs frequently.  He underwent neuropsychological testing with Dr. Elesa Massed on 08/14/14.  He demonstrated deficits in verbal fluency, visual attention, and memory retrieval and recognition in presence of  minimal hypersomnolence.  Profile raised concern for "possible suboptimal performance maximal to aspects of the bilateral frontal and temporal lobes".  Possible contributing factors may include fatigue, or sleep disorder.  Adjustment disorder with anxiety and depression is possible, but thought not to be the primary factor based on pattern of test performance.  MRI of the brain performed 09/22/14 showed mild nonspecific white matter hyperintensities but otherwise unremarkable.  He also has had episodes of dizziness for the past 2 years.  He describes sudden onset spinning sensation that is triggered by bright fluorescent light.  It is associated with bi-frontal non-throbbing headache, which is typical for his headaches.  It is sometimes associated with nausea.  There is no phonophobia.  He sometimes sees spots but no positive visual aura phenomena.  It lasts 30 minutes and occurs 2 to 3 times a week.    He also has tremor.  Primidone was ineffective.  Propranolol is more effective.  PAST MEDICAL HISTORY: Past Medical History  Diagnosis Date  . Helicobacter pylori gastritis 06/30/06  . GERD (gastroesophageal reflux disease)   . Chronic headache   . Carpal tunnel syndrome on both sides 03/28/2013  . 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 TIA (transient ischemic attack)     MEDICATIONS: Current Outpatient Prescriptions on File Prior to Visit  Medication Sig Dispense Refill  . Armodafinil (NUVIGIL) 150 MG tablet Take 1 tablet (150 mg total) by mouth daily. 30 tablet 3  . omeprazole (PRILOSEC) 40 MG capsule Take 1 capsule (40 mg total) by mouth 2 (two) times daily. 180 capsule 3  .  PARoxetine (PAXIL) 30 MG tablet Take 1 tablet (30 mg total) by mouth daily. 30 tablet 2  . propranolol ER (INDERAL LA) 80 MG 24 hr capsule Take 1 capsule (80 mg total) by mouth daily. 30 capsule 5  . testosterone cypionate (DEPOTESTOTERONE CYPIONATE) 200 MG/ML injection Every 2 weeks    . zolpidem (AMBIEN CR) 12.5 MG  CR tablet Take 1 tablet (12.5 mg total) by mouth at bedtime as needed for sleep. 30 tablet 2   No current facility-administered medications on file prior to visit.    ALLERGIES: Allergies  Allergen Reactions  . Ioxaglate   . Ivp Dye [Iodinated Diagnostic Agents]     FAMILY HISTORY: Family History  Problem Relation Age of Onset  . Heart disease    . Barrett's esophagus Mother   . Breast cancer Mother   . Diabetes Paternal Grandfather   . Heart disease Paternal Grandfather   . Irritable bowel syndrome Brother   . Allergies Brother   . Heart disease Paternal Grandmother   . Colon cancer Neg Hx   . Esophageal cancer Neg Hx     SOCIAL HISTORY: Social History   Social History  . Marital Status: Married    Spouse Name: N/A  . Number of Children: 1  . Years of Education: N/A   Occupational History  . Full time student    Social History Main Topics  . Smoking status: Never Smoker   . Smokeless tobacco: Never Used  . Alcohol Use: No  . Drug Use: No  . Sexual Activity:    Partners: Female   Other Topics Concern  . Not on file   Social History Narrative    REVIEW OF SYSTEMS: Constitutional: No fevers, chills, or sweats, no generalized fatigue, change in appetite Eyes: No visual changes, double vision, eye pain Ear, nose and throat: No hearing loss, ear pain, nasal congestion, sore throat Cardiovascular: No chest pain, palpitations Respiratory:  No shortness of breath at rest or with exertion, wheezes GastrointestinaI: No nausea, vomiting, diarrhea, abdominal pain, fecal incontinence Genitourinary:  No dysuria, urinary retention or frequency Musculoskeletal:  No neck pain, back pain Integumentary: No rash, pruritus, skin lesions Neurological: as above Psychiatric: Depression Endocrine: No palpitations, fatigue, diaphoresis, mood swings, change in appetite, change in weight, increased thirst Hematologic/Lymphatic:  No anemia, purpura,  petechiae. Allergic/Immunologic: no itchy/runny eyes, nasal congestion, recent allergic reactions, rashes  PHYSICAL EXAM: Filed Vitals:   02/12/15 1048  BP: 100/62  Pulse: 63  Temp: 98.1 F (36.7 C)  Resp: 16   General: No acute distress.  Patient appears well-groomed.   Head:  Normocephalic/atraumatic Eyes:  Fundoscopic exam unremarkable without vessel changes, exudates, hemorrhages or papilledema. Neck: supple, no paraspinal tenderness, full range of motion Heart:  Regular rate and rhythm Lungs:  Clear to auscultation bilaterally Back: No paraspinal tenderness Neurological Exam: alert and oriented to person, place, and time. Attention span and concentration intact, recent and remote memory intact, fund of knowledge intact.  Speech fluent and not dysarthric, language intact.  CN II-XII intact. Fundoscopic exam unremarkable without vessel changes, exudates, hemorrhages or papilledema.  Bulk and tone normal, muscle strength 5/5 throughout.  Sensation to light touch intact.  Deep tendon reflexes 2+ throughout.  Finger to nose and heel to shin testing intact.  Gait normal, Romberg negative.  IMPRESSION: Recurrent vertigo associated with headaches.  Possibly migraine with migraine associated vertigo Tremor Opioid addiction  PLAN: Increase topiramate to  at bedtime Stop sumatriptan and try Relpax  Continue propranolol  ER 80mg  Provided 24 hour Hotline for the [facility] to help with addiction Follow up in 3 months.  He is to call in 4 weeks with update.  15 minutes spent face to face with patient, over 50% spent discussing management.  Shon Millet, DO  CC:  [place]

## 2015-02-12 NOTE — Patient Instructions (Signed)
We will increase topiramate to  at bedtime.  I sent a prescription for  tablets.  But you can use of the  tablets by taking 2 tablets at bedtime.  Call in 4 weeks with update. Stop sumatriptan. Instead, take Relpax  at earliest onset of headache.  May repeat one tablet once in 2 hours if needed.   Do not exceed 2 tablets in 24 hours. Contact the [facility] 24 hour help line for your addicton.  [phone].  Contact us if you need anything else regarding this. Follow up in 3 months.

## 2015-02-25 ENCOUNTER — Encounter (HOSPITAL_COMMUNITY): Payer: Self-pay | Admitting: Psychiatry

## 2015-02-25 ENCOUNTER — Ambulatory Visit (INDEPENDENT_AMBULATORY_CARE_PROVIDER_SITE_OTHER): Payer: 59 | Admitting: Psychiatry

## 2015-02-25 VITALS — BP 126/78 | HR 60 | Ht 76.0 in | Wt 253.0 lb

## 2015-02-25 DIAGNOSIS — F411 Generalized anxiety disorder: Secondary | ICD-10-CM | POA: Diagnosis not present

## 2015-02-25 DIAGNOSIS — F339 Major depressive disorder, recurrent, unspecified: Secondary | ICD-10-CM | POA: Diagnosis not present

## 2015-02-25 MED ORDER — PAROXETINE HCL 30 MG PO TABS: 30.0000 mg | ORAL_TABLET | Freq: Every day | ORAL | Status: DC

## 2015-02-25 MED ORDER — LAMOTRIGINE 25 MG PO TABS: 25.0000 mg | ORAL_TABLET | Freq: Every day | ORAL | Status: DC

## 2015-02-25 MED ORDER — ZOLPIDEM TARTRATE ER 12.5 MG PO TBCR: 12.5000 mg | EXTENDED_RELEASE_TABLET | Freq: Every evening | ORAL | Status: DC | PRN

## 2015-02-25 NOTE — Progress Notes (Signed)
[facility] M.D. progress note  Patient Identification: Timothy [name] MRN:  [id number] Date of Evaluation:  02/25/2015 Chief Complaint:   depression anxiety Visit Diagnosis:    ICD-9-CM ICD-10-CM   1. Major depression, recurrent, chronic 296.30 F33.9   2. GAD (generalized anxiety disorder) 300.02 F41.1    Diagnosis:   Patient Active Problem List   Diagnosis Date Noted  . Generalized anxiety disorder [F41.1] 12/31/2014    Priority: High  . Major depression, chronic [F32.2] 12/31/2014    Priority: High  . Hypersomnolence disorder, acute, moderate [G47.10] 12/31/2014    Priority: High  . Recurrent vertigo [R42] 02/12/2015  . Migraine without aura and without status migrainosus, not intractable [G43.009] 10/15/2014  . Essential tremor [G25.0] 10/15/2014  . Upper airway resistance syndrome [G47.8] 09/22/2014  . Carpal tunnel syndrome on both sides [G56.01, G56.02] 03/28/2013  . 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 Pleuritic chest pain [R07.81] 11/22/2011  . Cough [R05] 11/22/2011  . Hypersomnia [G47.10] 11/05/2010   Assessment  patient seen today with his wife, with the patient's permission, states that that he has been experiencing more depression lately and mood swings. Wife states that he may not have taken the Paxil previously but started it with 30 mg when I put him on that.   Patient is focused on the family history of bipolar disorder and is worried that he has bipolar disorder. His wife states that he has been noncompliant with his medications patient to refute that stating that he has been mostly compliant.   Patient has moved into a different apartment so he does not have to deal with his boss and states that his sleeping well appetite is good mood tends to be labile but he is not angry and throwing things like he did before.   Wife tries to take care of him and since his medication box but patient tends to be resistant. He also has great difficulty waking up in the  morning. Discussed sleep hygiene again and given the fact that he is forgetting to take his pills recommended that wife give him the pills in the morning and evening so that she is not stressed out and worried about his med compliance.   Patient reports his anxiety is significantly improved denies feeling hopeless helpless no suicidal or homicidal ideation no hallucinations or delusions.   Discussed getting marital counseling in both are open to that. Given his mood swings I discussed the rationale risks benefits options of Lamictal for mood stabilization and she gave informed consent he'll start Lamictal 25 mg by mouth daily at bedtime.   He'll continue his other medications at the present time. Wife reports the vacation was a disaster as patient was quite irritable.    Associated Signs/Symptoms: Depression Symptoms:   Disturbed sleep, Anxiety, fatigue, Difficulty concentrating Mildly depressed mood  (Hypo) Manic Symptoms:  None Anxiety Symptoms:  Excessive Worry, Psychotic Symptoms:  None PTSD Symptoms: NA  Past Medical History: Essential tremor, migraine, GERD, history of TIA. Problems with hearing, Mosaic Klinefelter syndrome. Past Medical History  Diagnosis Date  . Helicobacter pylori gastritis 06/30/06  . GERD (gastroesophageal reflux disease)   . Chronic headache   . Carpal tunnel syndrome on both sides 03/28/2013  . 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 TIA (transient ischemic attack)     Past Surgical History  Procedure Laterality Date  . Unremarkable     Family History: Brother and sister have depression Family History  Problem Relation Age of Onset  .  Heart disease    . Barrett's esophagus Mother   . Breast cancer Mother   . Diabetes Paternal Grandfather   . Heart disease Paternal Grandfather   . Irritable bowel syndrome Brother   . Allergies Brother   . Heart disease Paternal Grandmother   . Colon cancer Neg Hx   . Esophageal cancer Neg Hx    Social  History:  Patient lives in [place] saw met with his wife and 2 children aged 44 years and 75 months. He has a good relationship with his wife Social History   Social History  . Marital Status: Married    Spouse Name: N/A  . Number of Children: 1  . Years of Education: N/A   Occupational History  . Full time student    Social History Main Topics  . Smoking status: Never Smoker   . Smokeless tobacco: Never Used  . Alcohol Use: No  . Drug Use: No  . Sexual Activity:    Partners: Female   Other Topics Concern  . None   Social History Narrative     Musculoskeletal: Strength & Muscle Tone: within normal limits Gait & Station: normal, Normal Patient leans: Stands straight  Psychiatric Specialty Exam: HPI  Review of Systems  Constitutional: Positive for malaise/fatigue. Negative for fever, chills, weight loss and diaphoresis.  HENT: Positive for hearing loss. Negative for congestion, ear discharge, ear pain, nosebleeds, sore throat and tinnitus.   Eyes: Negative for blurred vision, double vision, photophobia and pain.  Respiratory: Positive for cough and shortness of breath. Negative for hemoptysis, sputum production, wheezing and stridor.   Cardiovascular: Negative for chest pain, palpitations, orthopnea, claudication, leg swelling and PND.  Gastrointestinal: Positive for heartburn. Negative for nausea, vomiting, abdominal pain, diarrhea, constipation and blood in stool.  Genitourinary: Negative for dysuria, urgency, hematuria and flank pain.  Musculoskeletal: Negative for myalgias, back pain, joint pain, falls and neck pain.  Skin: Negative for itching and rash.  Neurological: Positive for dizziness and tremors. Negative for tingling, sensory change, speech change, focal weakness, seizures, loss of consciousness and weakness.  Endo/Heme/Allergies: Negative for environmental allergies and polydipsia. Does not bruise/bleed easily.  Psychiatric/Behavioral: Positive for depression.  The patient is nervous/anxious. The patient does not have insomnia.     Blood pressure 126/78, pulse 60, height 6' 4"  (1.93 m), weight 253 lb (114.76 kg).Body mass index is 30.81 kg/(m^2).  General Appearance: Casual  Eye Contact:  Good  Speech:  Clear and Coherent and Normal Rate  Volume:  Normal  Mood:  Anxious   Affect:  Appropriate   Thought Process:  Goal Directed, Linear and Logical  Orientation:  Full (Time, Place, and Person)  Thought Content:  Rumination  Suicidal Thoughts:  No  Homicidal Thoughts:  No  Memory:  Immediate is good, recent is good remote is good   Judgement:  Good  Insight:  Good  Psychomotor Activity:  Normal  Concentration:  I good   Recall:  Good   Fund of Knowledge:Fair  Language: Good  Akathisia:  No  Handed:  Right  AIMS (if indicated):  0  Assets:  Communication Skills Desire for Improvement Physical Health Resilience Social Support Transportation  ADL's:  Intact  Cognition: WNL  Sleep:  =5hrs   Is the patient at risk to self?  No. Has the patient been a risk to self in the past 6 months?  No. Has the patient been a risk to self within the distant past?  No. Is the patient a  risk to others?  No. Has the patient been a risk to others in the past 6 months?  No. Has the patient been a risk to others within the distant past?  No.  Allergies:   Allergies  Allergen Reactions  . Ioxaglate   . Ivp Dye [Iodinated Diagnostic Agents]    Current Medications: Current Outpatient Prescriptions  Medication Sig Dispense Refill  . Armodafinil (NUVIGIL) 150 MG tablet Take 1 tablet (150 mg total) by mouth daily. 30 tablet 3  . eletriptan (RELPAX) 40 MG tablet Take 1tab at earliest onset of headache.  May repeat x1 in 2 hours if headache persists or recurs. 10 tablet 3  . omeprazole (PRILOSEC) 40 MG capsule Take 1 capsule (40 mg total) by mouth 2 (two) times daily. 180 capsule 3  . PARoxetine (PAXIL) 30 MG tablet Take 1 tablet (30 mg total) by mouth  daily. 30 tablet 2  . propranolol ER (INDERAL LA) 80 MG 24 hr capsule Take 1 capsule (80 mg total) by mouth daily. 30 capsule 5  . testosterone cypionate (DEPOTESTOTERONE CYPIONATE) 200 MG/ML injection Every 2 weeks    . topiramate (TOPAMAX) 50 MG tablet Take 1 tablet (50 mg total) by mouth at bedtime. 30 tablet 3  . zolpidem (AMBIEN CR) 12.5 MG CR tablet Take 1 tablet (12.5 mg total) by mouth at bedtime as needed for sleep. 30 tablet 2   No current facility-administered medications for this visit.    Previous Psychotropic Medications: Yes   Substance Abuse History in the last 12 months:  No.  Consequences of Substance Abuse: NA  Medical Decision Making:  Review of Psycho-Social Stressors (1), Review or order clinical lab tests (1), Decision to obtain old records (1), Review and summation of old records (2), New Problem, with no additional work-up planned (3), Review of Medication Regimen & Side Effects (2) and Review of New Medication or Change in Dosage (2)  Treatment Plan Summary: Medication management #1 Maj. depression chronic Patient will be continued on Paxil  30 mg by mouth every p.m. #2 cyclothymic disorder Patient will start Lamictal 25 mg by mouth daily at bedtime. #2 Generalized anxiety disorder Will be treated with Paxil CBT for anxiety was discussed with relaxation techniques and cognitive restructuring of his cognitive distortions. #3 insomnia Treat with Ambien 12.5 mg by mouth daily at bedtime. Sleep hygiene was discussed in great detail. With the patient doing his paperwork between 8:30 to 10:30 and then taking his medications at 10:15 and going to bed by 10:30 PM. #4 hypersomnolence disorder Patient was asked to take his Nuvigil at 6 AM and return back to sleep so than he would be able to breakup. #5 labs   Patient did not get his labs CBC CMP TSH T4 hemoglobin A1c lipid panel. #6 patient will return to see me in the clinic in 2 weeks. Call sooner if  necessary. More than 50% of the time was spent in explaining the role of medications and CBT for his anxiety and discussing sleep hygiene and medication compliance and making a schedule every day and following the schedule in detail. This visit was of high intensity.  Erin Sons 9/21/20169:22 AM

## 2015-02-27 ENCOUNTER — Telehealth: Payer: Self-pay | Admitting: Neurology

## 2015-02-27 DIAGNOSIS — H8113 Benign paroxysmal vertigo, bilateral: Secondary | ICD-10-CM

## 2015-02-27 MED ORDER — MECLIZINE HCL 32 MG PO TABS: 32.0000 mg | ORAL_TABLET | Freq: Three times a day (TID) | ORAL | Status: DC | PRN

## 2015-02-27 NOTE — Telephone Encounter (Signed)
Spoke with wife and she was advised that medication was sent in. Thanks [facility] [place]

## 2015-02-27 NOTE — Telephone Encounter (Signed)
I don't think diclofenac would be helpful for spells of vertigo.  He could try meclizine  three times daily as needed.

## 2015-02-27 NOTE — Telephone Encounter (Signed)
lmtcb

## 2015-02-27 NOTE — Telephone Encounter (Signed)
Spoke with patients wife. She stated that the patient has been having episodes of vertigo recently and she is concerned. She stated that his previous Neurologist ([facility]) had him on Diclofenac Sodium for this issue. She states that he has never tried Meclizine before. 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 Thanks

## 2015-02-27 NOTE — Telephone Encounter (Signed)
Pt's wife Herbert Seta called and said her husband was having a lot of dizziness and would like a call back, ok to leave a message if she doesn't  answer/Dawn CB# [phone]

## 2015-03-13 ENCOUNTER — Ambulatory Visit (INDEPENDENT_AMBULATORY_CARE_PROVIDER_SITE_OTHER): Payer: 59 | Admitting: Psychiatry

## 2015-03-13 ENCOUNTER — Encounter (HOSPITAL_COMMUNITY): Payer: Self-pay | Admitting: Psychiatry

## 2015-03-13 VITALS — BP 125/69 | HR 60 | Ht 76.0 in | Wt 240.0 lb

## 2015-03-13 DIAGNOSIS — F411 Generalized anxiety disorder: Secondary | ICD-10-CM | POA: Diagnosis not present

## 2015-03-13 DIAGNOSIS — G4710 Hypersomnia, unspecified: Secondary | ICD-10-CM

## 2015-03-13 DIAGNOSIS — F331 Major depressive disorder, recurrent, moderate: Secondary | ICD-10-CM

## 2015-03-13 MED ORDER — LAMOTRIGINE 25 MG PO TABS: 25.0000 mg | ORAL_TABLET | Freq: Two times a day (BID) | ORAL | Status: DC

## 2015-03-13 NOTE — Progress Notes (Signed)
[facility] M.D. progress note  Patient Identification: Timothy [name] MRN:  [id number] Date of Evaluation:  03/13/2015 Chief Complaint:   depression anxiety Visit Diagnosis:    ICD-9-CM ICD-10-CM   1. Moderate episode of recurrent major depressive disorder (HCC) 296.32 F33.1   2. GAD (generalized anxiety disorder) 300.02 F41.1   3. Hypersomnolence disorder 780.54 G47.10    Diagnosis:   Patient Active Problem List   Diagnosis Date Noted  . Generalized anxiety disorder [F41.1] 12/31/2014    Priority: High  . Major depression, chronic ([place]) [F32.9] 12/31/2014    Priority: High  . Hypersomnolence disorder, acute, moderate [G47.10] 12/31/2014    Priority: High  . Recurrent vertigo [R42] 02/12/2015  . Migraine without aura and without status migrainosus, not intractable [G43.009] 10/15/2014  . Essential tremor [G25.0] 10/15/2014  . Upper airway resistance syndrome [G47.8] 09/22/2014  . Carpal tunnel syndrome on both sides [G56.03] 03/28/2013  . 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 Pleuritic chest pain [R07.81] 11/22/2011  . Cough [R05] 11/22/2011  . Hypersomnia [G47.10] 11/05/2010   Assessment  patient seen today with his wife, with the patient's permission, states that since he started the Lamictal he has been feeling much better. Wife states that she could not handle him any longer so after all last visit she asked him to leave the home and he was gone for 2 days. Upon returning patient has been compliant with his medications continues to struggle waking up in the morning but once he wakes up his helping around the house. Mood is better, they both agreed that patient has an excessive amount of work and he has to drive around a lot in order to complete his work. Patient complains of feeling dizzy he had a sleep study done the results of which was sent here. Official results are not yet in. Recently patient had an MRI which was normal Discussed with the patient that there could be wax  buildup in his ears and he needs to clean his years with [employer] patient stated understanding.    Patient reports his anxiety is significantly improved denies feeling hopeless helpless no suicidal or homicidal ideation no hallucinations or delusions. Patient is sleeping good , appetite is good. Had one panic attack but overall his coping well    Past Medical History: Essential tremor, migraine, GERD, history of TIA. Problems with hearing, Mosaic Klinefelter syndrome. Past Medical History  Diagnosis Date  . Helicobacter pylori gastritis 06/30/06  . GERD (gastroesophageal reflux disease)   . Chronic headache   . Carpal tunnel syndrome on both sides 03/28/2013  . 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 TIA (transient ischemic attack)     Past Surgical History  Procedure Laterality Date  . Unremarkable     Family History: Brother and sister have depression Family History  Problem Relation Age of Onset  . Heart disease    . Barrett's esophagus Mother   . Breast cancer Mother   . Diabetes Paternal Grandfather   . Heart disease Paternal Grandfather   . Irritable bowel syndrome Brother   . Allergies Brother   . Heart disease Paternal Grandmother   . Colon cancer Neg Hx   . Esophageal cancer Neg Hx    Social History:  Patient lives in [place] saw met with his wife and 2 children aged 19 years and 21 months. He has a good relationship with his wife Social History   Social History  . Marital Status: Married    Spouse Name: N/A  .  Number of Children: 1  . Years of Education: N/A   Occupational History  . Full time student    Social History Main Topics  . Smoking status: Never Smoker   . Smokeless tobacco: Never Used  . Alcohol Use: No  . Drug Use: No  . Sexual Activity:    Partners: Female   Other Topics Concern  . None   Social History Narrative     Musculoskeletal: Strength & Muscle Tone: within normal limits Gait & Station: normal, Normal Patient leans: Stands  straight  Psychiatric Specialty Exam: HPI  Review of Systems  Constitutional: Positive for malaise/fatigue. Negative for fever, chills, weight loss and diaphoresis.  HENT: Positive for hearing loss. Negative for congestion, ear discharge, ear pain, nosebleeds, sore throat and tinnitus.   Eyes: Negative for blurred vision, double vision, photophobia and pain.  Respiratory: Positive for shortness of breath. Negative for cough, hemoptysis, sputum production, wheezing and stridor.   Cardiovascular: Negative for chest pain, palpitations, orthopnea, claudication, leg swelling and PND.  Gastrointestinal: Positive for heartburn. Negative for nausea, vomiting, abdominal pain, diarrhea, constipation and blood in stool.  Genitourinary: Negative for dysuria, urgency, hematuria and flank pain.  Musculoskeletal: Negative for myalgias, back pain, joint pain, falls and neck pain.  Skin: Negative for itching and rash.  Neurological: Positive for dizziness. Negative for tingling, tremors, sensory change, speech change, focal weakness, seizures, loss of consciousness and weakness.  Endo/Heme/Allergies: Negative for environmental allergies and polydipsia. Does not bruise/bleed easily.  Psychiatric/Behavioral: Positive for depression. The patient is nervous/anxious. The patient does not have insomnia.     Blood pressure 125/69, pulse 60, height _0  (1.93 m), weight 240 lb (108.863 kg).Body mass index is 29.23 kg/(m^2).  General Appearance: Casual  Eye Contact:  Good  Speech:  Clear and Coherent and Normal Rate  Volume:  Normal  Mood:  Anxious   Affect:  Appropriate   Thought Process:  Goal Directed, Linear and Logical  Orientation:  Full (Time, Place, and Person)  Thought Content:  Rumination  Suicidal Thoughts:  No  Homicidal Thoughts:  No  Memory:  Immediate is good, recent is good remote is good   Judgement:  Good  Insight:  Good  Psychomotor Activity:  Normal  Concentration:  I good   Recall:   Good   Fund of Knowledge:Fair  Language: Good  Akathisia:  No  Handed:  Right  AIMS (if indicated):  0  Assets:  Communication Skills Desire for Improvement Physical Health Resilience Social Support Transportation  ADL's:  Intact  Cognition: WNL  Sleep:  =5hrs   Is the patient at risk to self?  No. Has the patient been a risk to self in the past 6 months?  No. Has the patient been a risk to self within the distant past?  No. Is the patient a risk to others?  No. Has the patient been a risk to others in the past 6 months?  No. Has the patient been a risk to others within the distant past?  No.  Allergies:   Allergies  Allergen Reactions  . Ioxaglate   . Ivp Dye [Iodinated Diagnostic Agents]    Current Medications: Current Outpatient Prescriptions  Medication Sig Dispense Refill  . Armodafinil (NUVIGIL) 150 MG tablet Take 1 tablet (150 mg total) by mouth daily. 30 tablet 3  . eletriptan (RELPAX) 40 MG tablet Take 1tab at earliest onset of headache.  May repeat x1 in 2 hours if headache persists or recurs. 10 tablet 3  .  lamoTRIgine (LAMICTAL) 25 MG tablet Take 1 tablet (25 mg total) by mouth daily. 30 tablet 2  . meclizine (ANTIVERT) 32 MG tablet Take 1 tablet (32 mg total) by mouth 3 (three) times daily as needed. 30 tablet 0  . omeprazole (PRILOSEC) 40 MG capsule Take 1 capsule (40 mg total) by mouth 2 (two) times daily. 180 capsule 3  . PARoxetine (PAXIL) 30 MG tablet Take 1 tablet (30 mg total) by mouth daily. 30 tablet 2  . propranolol ER (INDERAL LA) 80 MG 24 hr capsule Take 1 capsule (80 mg total) by mouth daily. 30 capsule 5  . testosterone cypionate (DEPOTESTOTERONE CYPIONATE) 200 MG/ML injection Every 2 weeks    . topiramate (TOPAMAX) 50 MG tablet Take 1 tablet (50 mg total) by mouth at bedtime. 30 tablet 3  . zolpidem (AMBIEN CR) 12.5 MG CR tablet Take 1 tablet (12.5 mg total) by mouth at bedtime as needed for sleep. 30 tablet 2   No current facility-administered  medications for this visit.    Previous Psychotropic Medications: Yes   Substance Abuse History in the last 12 months:  No.  Consequences of Substance Abuse: NA  Medical Decision Making:  Review of Psycho-Social Stressors (1), Review or order clinical lab tests (1), Decision to obtain old records (1), Review and summation of old records (2), New Problem, with no additional work-up planned (3), Review of Medication Regimen & Side Effects (2) and Review of New Medication or Change in Dosage (2)  Treatment Plan Summary: Medication management #1 Maj. depression chronic Patient will be continued on Paxil  30 mg by mouth every p.m. #2 cyclothymic disorder Increase Lamictal 25 mg po bid #2 Generalized anxiety disorder Will be treated with Paxil CBT for anxiety was discussed with relaxation techniques and cognitive restructuring of his cognitive distortions. #3 insomnia Change to ambien 10 mg po q hs Sleep hygiene was discussed in great detail. With the patient doing his paperwork between 8:30 to 10:30 and then taking his medications at 10:15 and going to bed by 10:30 PM. #4 hypersomnolence disorder Patient was asked to take his Nuvigil at 6 AM and return back to sleep so than he would be able to breakup. #5 labs   Patient will get his labs CBC CMP TSH T4 hemoglobin A1c lipid panel.  #6 patient will return to see me in the clinic in 4weeks. Call sooner if necessary. More than 50% of the time was spent in explaining the role of medications and CBT for his anxiety and discussing sleep hygiene and medication compliance and making a schedule every day and following the schedule in detail. This visit was of high intensity.  Erin Sons 10/7/20169:51 AM

## 2015-04-13 ENCOUNTER — Encounter (HOSPITAL_COMMUNITY): Payer: Self-pay | Admitting: Psychiatry

## 2015-04-13 ENCOUNTER — Ambulatory Visit (INDEPENDENT_AMBULATORY_CARE_PROVIDER_SITE_OTHER): Payer: 59 | Admitting: Psychiatry

## 2015-04-13 VITALS — BP 132/79 | HR 56 | Ht 76.0 in | Wt 238.8 lb

## 2015-04-13 DIAGNOSIS — G4710 Hypersomnia, unspecified: Secondary | ICD-10-CM | POA: Diagnosis not present

## 2015-04-13 DIAGNOSIS — F329 Major depressive disorder, single episode, unspecified: Secondary | ICD-10-CM

## 2015-04-13 DIAGNOSIS — F411 Generalized anxiety disorder: Secondary | ICD-10-CM | POA: Diagnosis not present

## 2015-04-13 MED ORDER — LAMOTRIGINE 25 MG PO TABS: 25.0000 mg | ORAL_TABLET | Freq: Two times a day (BID) | ORAL | Status: DC

## 2015-04-13 MED ORDER — PAROXETINE HCL 30 MG PO TABS: 30.0000 mg | ORAL_TABLET | Freq: Every day | ORAL | Status: DC

## 2015-04-13 NOTE — Progress Notes (Signed)
[facility] M.D[facility] progress note  Patient Identification: Timothy [name] MRN:  [id number] Date of Evaluation:  04/13/2015 Chief Complaint:   depression anxiety Visit Diagnosis:    ICD-9-CM ICD-10-CM   1. Generalized anxiety disorder 300.02 F41.1   2. Hypersomnolence disorder, acute, moderate 780.54 G47.10   3. Major depression, chronic (HCC) 296.20 F32.9    Diagnosis:   Patient Active Problem List   Diagnosis Date Noted  . Generalized anxiety disorder [F41.1] 12/31/2014    Priority: High  . Major depression, chronic ([place]) [F32.9] 12/31/2014    Priority: High  . Hypersomnolence disorder, acute, moderate [G47.10] 12/31/2014    Priority: High  . Recurrent vertigo [R42] 02/12/2015  . Migraine without aura and without status migrainosus, not intractable [G43.009] 10/15/2014  . Essential tremor [G25.0] 10/15/2014  . Upper airway resistance syndrome [G47.8] 09/22/2014  . Carpal tunnel syndrome on both sides [G56.03] 03/28/2013  . 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 Pleuritic chest pain [R07.81] 11/22/2011  . Cough [R05] 11/22/2011  . Hypersomnia [G47.10] 11/05/2010   Assessment  patient seen today with his wife, with the patient's permission, states that since he started the Lamictal twice a day and it made him sleepy all day and he slept all day so he stopped taking the morning dose of Lamictal. Patient continues to struggle with waking up in the morning and he states that this is the busiest time his putting and 50 hours. He also has a tendency to get lost in [place] and [place]. Discussed mapping out a route for him to go to but he states that every day the Route changes and he has to go to different places which makes it very difficult. Encouraged patient to talk to his boss regarding his difficulties and he stated he would do that.  Patient had his years cleaned and he had a lot of wax, now he no longer feels dizzy. His wife states that she is picking her battles and she goes to  bed early as he continues to do his work at home.  Patient states his sleep is good appetite is good mood is average denies feeling depressed still has anxiety but that is related to feeling overwhelmed. He also tends to be a little forgetful discussed organizational  skills and having a bag to put everything in. So he is organized. Denies feeling hopeless and helpless, denies suicidal or homicidal ideation and no hallucinations or delusions.  Patient states he got his lab work done but there no lab results wife will check on that. Also discussed finishing his work but 10:30 and getting into bed with meditation music he stated understanding. Discussed taking Lamictal 50 mg at bedtime and he stated understanding.  Patient has to go see his sleep doctor. MRI was normal      Past Medical History: Essential tremor, migraine, GERD, history of TIA. Problems with hearing, Mosaic Klinefelter syndrome. Past Medical History  Diagnosis Date  . Helicobacter pylori gastritis 06/30/06  . GERD (gastroesophageal reflux disease)   . Chronic headache   . Carpal tunnel syndrome on both sides 03/28/2013  . 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 TIA (transient ischemic attack)     Past Surgical History  Procedure Laterality Date  . Unremarkable     Family History: Brother and sister have depression Family History  Problem Relation Age of Onset  . Heart disease    . Barrett's esophagus Mother   . Breast cancer Mother   . Diabetes Paternal Grandfather   .  Heart disease Paternal Grandfather   . Irritable bowel syndrome Brother   . Allergies Brother   . Heart disease Paternal Grandmother   . Colon cancer Neg Hx   . Esophageal cancer Neg Hx    Social History:  Patient lives in [place] saw met with his wife and 2 children aged 25 years and 78 months. He has a good relationship with his wife Social History   Social History  . Marital Status: Married    Spouse Name: N/A  . Number of Children: 1  .  Years of Education: N/A   Occupational History  . Full time student    Social History Main Topics  . Smoking status: Never Smoker   . Smokeless tobacco: Never Used  . Alcohol Use: No  . Drug Use: No  . Sexual Activity:    Partners: Female   Other Topics Concern  . Not on file   Social History Narrative     Musculoskeletal: Strength & Muscle Tone: within normal limits Gait & Station: normal, Normal Patient leans: Stands straight  Psychiatric Specialty Exam: HPI  Review of Systems  Constitutional: Positive for malaise/fatigue. Negative for fever, chills, weight loss and diaphoresis.  HENT: Positive for hearing loss. Negative for congestion, ear discharge, ear pain, nosebleeds, sore throat and tinnitus.   Eyes: Negative for blurred vision, double vision, photophobia and pain.  Respiratory: Positive for shortness of breath. Negative for cough, hemoptysis, sputum production, wheezing and stridor.   Cardiovascular: Negative for chest pain, palpitations, orthopnea, claudication, leg swelling and PND.  Gastrointestinal: Positive for heartburn. Negative for nausea, vomiting, abdominal pain, diarrhea, constipation and blood in stool.  Genitourinary: Negative for dysuria, urgency, hematuria and flank pain.  Musculoskeletal: Negative for myalgias, back pain, joint pain, falls and neck pain.  Skin: Negative for itching and rash.  Neurological: Positive for dizziness. Negative for tingling, tremors, sensory change, speech change, focal weakness, seizures, loss of consciousness and weakness.  Endo/Heme/Allergies: Negative for environmental allergies and polydipsia. Does not bruise/bleed easily.  Psychiatric/Behavioral: Positive for depression. The patient is nervous/anxious. The patient does not have insomnia.     Blood pressure 132/79, pulse 56, height 6' 4"  (1.93 m), weight 238 lb 12.8 oz (108.319 kg).Body mass index is 29.08 kg/(m^2).  General Appearance: Casual  Eye Contact:  Good   Speech:  Clear and Coherent and Normal Rate  Volume:  Normal  Mood:  Anxious   Affect:  Appropriate   Thought Process:  Goal Directed, Linear and Logical  Orientation:  Full (Time, Place, and Person)  Thought Content:  Rumination  Suicidal Thoughts:  No  Homicidal Thoughts:  No  Memory:  Immediate is good, recent is good remote is good   Judgement:  Good  Insight:  Good  Psychomotor Activity:  Normal  Concentration:  I good   Recall:  Good   Fund of Knowledge:Fair  Language: Good  Akathisia:  No  Handed:  Right  AIMS (if indicated):  0  Assets:  Communication Skills Desire for Improvement Physical Health Resilience Social Support Transportation  ADL's:  Intact  Cognition: WNL  Sleep:  =5hrs   Is the patient at risk to self?  No. Has the patient been a risk to self in the past 6 months?  No. Has the patient been a risk to self within the distant past?  No. Is the patient a risk to others?  No. Has the patient been a risk to others in the past 6 months?  No. Has the  patient been a risk to others within the distant past?  No.  Allergies:   Allergies  Allergen Reactions  . Ioxaglate   . Ivp Dye [Iodinated Diagnostic Agents]    Current Medications: Current Outpatient Prescriptions  Medication Sig Dispense Refill  . Armodafinil (NUVIGIL) 150 MG tablet Take 1 tablet (150 mg total) by mouth daily. 30 tablet 3  . eletriptan (RELPAX) 40 MG tablet Take 1tab at earliest onset of headache.  May repeat x1 in 2 hours if headache persists or recurs. 10 tablet 3  . lamoTRIgine (LAMICTAL) 25 MG tablet Take 1 tablet (25 mg total) by mouth 2 (two) times daily. 60 tablet 2  . meclizine (ANTIVERT) 32 MG tablet Take 1 tablet (32 mg total) by mouth 3 (three) times daily as needed. 30 tablet 0  . omeprazole (PRILOSEC) 40 MG capsule Take 1 capsule (40 mg total) by mouth 2 (two) times daily. 180 capsule 3  . PARoxetine (PAXIL) 30 MG tablet Take 1 tablet (30 mg total) by mouth daily. 30  tablet 2  . propranolol ER (INDERAL LA) 80 MG 24 hr capsule Take 1 capsule (80 mg total) by mouth daily. 30 capsule 5  . testosterone cypionate (DEPOTESTOTERONE CYPIONATE) 200 MG/ML injection Every 2 weeks    . topiramate (TOPAMAX) 50 MG tablet Take 1 tablet (50 mg total) by mouth at bedtime. 30 tablet 3   No current facility-administered medications for this visit.    Previous Psychotropic Medications: Yes   Substance Abuse History in the last 12 months:  No.  Consequences of Substance Abuse: NA  Medical Decision Making:  Review of Psycho-Social Stressors (1), Review or order clinical lab tests (1), Decision to obtain old records (1), Review and summation of old records (2), New Problem, with no additional work-up planned (3), Review of Medication Regimen & Side Effects (2) and Review of New Medication or Change in Dosage (2)  Treatment Plan Summary: Medication management #1 Maj. depression chronic Patient will be continued on Paxil  30 mg by mouth every p.m. #2 cyclothymic disorder Change Lamictal 50 mg by mouth daily at bedtime #2 Generalized anxiety disorder Will be treated with Paxil CBT for anxiety was discussed with relaxation techniques and cognitive restructuring of his cognitive distortions. #3 insomnia Continue ambien 10 mg po q hs Sleep hygiene was discussed in great detail. With the patient doing his paperwork between 8:30 to 10:30 and then taking his medications at 10:15 and going to bed by 10:30 PM. #4 hypersomnolence disorder Patient was asked to take his Nuvigil at 6 AM and return back to sleep so than he would be able to breakup. #5 labs   Patient will get his labs CBC CMP TSH T4 hemoglobin A1c lipid panel.  #6 patient will return to see me in the clinic in 10 weeks. Call sooner if necessary.  #7 patient was referred to a therapist More than 50% of the time was spent in explaining the role of medications and CBT for his anxiety and discussing sleep hygiene and  medication compliance and making a schedule every day and following the schedule in detail. Also mapping out has ruled out for work, speaking to his boss and getting his lab work done. Patient was also referred to a therapist to talk about his anxiety. Relaxation techniques were also discussed with him. This visit was of high intensity.  Erin Sons 11/7/20169:08 AM

## 2015-04-23 ENCOUNTER — Telehealth: Payer: Self-pay | Admitting: Pulmonary Disease

## 2015-04-23 NOTE — Telephone Encounter (Signed)
lmtcb

## 2015-04-23 NOTE — Telephone Encounter (Signed)
I called spoke with pt. He reports the nuvigil 150 mg has been wearing off in about 4-5 hrs now. At first it was working great for him. He did some research and found an alternative to nuvigil was adderall. Also reports this is less expensive for him as well. He has an f/u appt with TP on 12/5. Please advise Dr. Craige CottaSood thanks

## 2015-04-23 NOTE — Telephone Encounter (Signed)
He can try taking additional dose of nuvigil in the afternoon as needed.  He needs to have ROV before changing to different medication like adderall.

## 2015-04-24 ENCOUNTER — Other Ambulatory Visit: Payer: Self-pay | Admitting: Pulmonary Disease

## 2015-04-24 NOTE — Telephone Encounter (Signed)
Pt called back and is aware of recs below. He verbalized understanding and needed nothing further. He will keep 12/5 appt.

## 2015-04-24 NOTE — Telephone Encounter (Signed)
Patient returned call, can be reached at [phone].

## 2015-04-24 NOTE — Telephone Encounter (Signed)
lmtcb for pt.  

## 2015-04-24 NOTE — Telephone Encounter (Signed)
LMTCB for pt 

## 2015-04-27 ENCOUNTER — Telehealth: Payer: Self-pay | Admitting: Pulmonary Disease

## 2015-04-27 MED ORDER — ARMODAFINIL 150 MG PO TABS: ORAL_TABLET | ORAL | Status: DC

## 2015-04-27 NOTE — Telephone Encounter (Signed)
Patient came by the office and said that he needed a refill on Nuvigil.  Patient called on 11/17 and Dr. Craige Cottasood increased his Nuvigil to 1 1/2 tablets daily.  Due to change in instructions, patient ran out of medication quicker since he only had enough for 1 tablet daily.  Based on Dr. Evlyn Couriersood's recommendations, Rx called in for Nuvigil 150mg  1 1/2 tablets.  Patient has appointment with TP on 05/11/15 to discuss Adderall.  Nothing further needed. Closing encounter

## 2015-04-28 ENCOUNTER — Telehealth: Payer: 59 | Admitting: Family

## 2015-04-28 DIAGNOSIS — R059 Cough, unspecified: Secondary | ICD-10-CM

## 2015-04-28 DIAGNOSIS — R05 Cough: Secondary | ICD-10-CM

## 2015-04-28 MED ORDER — BENZONATATE 100 MG PO CAPS: 100.0000 mg | ORAL_CAPSULE | Freq: Two times a day (BID) | ORAL | Status: DC | PRN

## 2015-04-28 MED ORDER — AZITHROMYCIN 250 MG PO TABS: ORAL_TABLET | ORAL | Status: DC

## 2015-04-28 NOTE — Progress Notes (Signed)

## 2015-05-07 ENCOUNTER — Other Ambulatory Visit: Payer: Self-pay

## 2015-05-07 MED ORDER — PROPRANOLOL HCL ER 80 MG PO CP24: 80.0000 mg | ORAL_CAPSULE | Freq: Every day | ORAL | Status: DC

## 2015-05-07 NOTE — Telephone Encounter (Signed)
Last OV: 02/12/15 Next OV: 05/25/15

## 2015-05-11 ENCOUNTER — Telehealth: Payer: Self-pay | Admitting: Pulmonary Disease

## 2015-05-11 ENCOUNTER — Ambulatory Visit (INDEPENDENT_AMBULATORY_CARE_PROVIDER_SITE_OTHER): Payer: 59 | Admitting: Adult Health

## 2015-05-11 ENCOUNTER — Encounter: Payer: Self-pay | Admitting: Adult Health

## 2015-05-11 VITALS — BP 120/78 | HR 58 | Temp 98.4°F | Ht 76.0 in | Wt 232.8 lb

## 2015-05-11 DIAGNOSIS — G4710 Hypersomnia, unspecified: Secondary | ICD-10-CM | POA: Diagnosis not present

## 2015-05-11 DIAGNOSIS — G478 Other sleep disorders: Secondary | ICD-10-CM

## 2015-05-11 NOTE — Patient Instructions (Signed)
We are setting you up for a MSLT .  Continue on Nuvigil .  follow up Dr. Craige CottaSood  In 2 months and As needed

## 2015-05-11 NOTE — Assessment & Plan Note (Signed)
No significant OSA on sleep study.  UARS may be contributing however insurance will not cover CPAP.  Concern that he may have some underlying nacrolepsy.  Will set up for a MSLT .  Advised to not drive if sleepy.   Plan  We are setting you up for a MSLT .  Continue on Nuvigil .  follow up Dr. Craige CottaSood  In 2 months and As needed

## 2015-05-11 NOTE — Telephone Encounter (Signed)
Pt saw TP today and told to f/u in 2 months. Dr. Craige CottaSood is completely booked through March and April schedule not out yet. Dr. Craige CottaSood do you want to overbook or pt see TP? thanks

## 2015-05-11 NOTE — Progress Notes (Signed)
Subjective:    Patient ID: Timothy [name], male    DOB: [birth year], 44 y.o.   MRN: [id number]  HPI 43 yo male with hypersomnia/UARS  (previous Dr. Shelle Iron patient) now followed by Dr. Craige Cotta .    05/11/2015 Follow up : Hypersomnia  Patient returns for a follow-up to discuss sleep study results. Patient was seen earlier this year for persistent daytime sleepiness despite using Nuvigil. Patient recently had a sleep study in 2012 that did not show any significant sleep apnea. Felt to have an upper airway resistance syndrome. He did use C Pap briefly and felt some clinical improvement. However, insurance would not cover C Pap.. Patient says he has significant daytime sleepiness, falls asleep very easily anytime that he is at rest. Wife reports that he falls asleep while sitting watching TV, reading, or even sometimes when he is talking to her. He denies any cataplexy.  Patient was started on Nuvigil daily, which he felt did help. However, over the last several months has been having more daytime sleepiness. Agent was set up for a sleep study on 01/30/2015 that showed an AHI at 1.1, SPO2 low at 90%. Study was compatible for an upper airway resistance syndrome. We discussed these results with patient and wife. Patient was instructed to increase his Nuvigil up to 1-1/2 tablets daily, however, was unable to do this due to increased headache. Patient is following with neurology for headaches and memory issues.. She denies any chest pain, orthopnea, PND or leg swelling    Past Medical History  Diagnosis Date  . Helicobacter pylori gastritis 06/30/06  . GERD (gastroesophageal reflux disease)   . Chronic headache   . Carpal tunnel syndrome on both sides 03/28/2013  . 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 TIA (transient ischemic attack)    Current Outpatient Prescriptions on File Prior to Visit  Medication Sig Dispense Refill  . Armodafinil (NUVIGIL) 150 MG tablet Take 1 1/2 tablets by mouth  daily. 45 tablet 0  . benzonatate (TESSALON) 100 MG capsule Take 1-2 capsules (100-200 mg total) by mouth 2 (two) times daily as needed for cough. 30 capsule 0  . eletriptan (RELPAX) 40 MG tablet Take 1tab at earliest onset of headache.  May repeat x1 in 2 hours if headache persists or recurs. 10 tablet 3  . lamoTRIgine (LAMICTAL) 25 MG tablet Take 1 tablet (25 mg total) by mouth 2 (two) times daily. 60 tablet 2  . omeprazole (PRILOSEC) 40 MG capsule Take 1 capsule (40 mg total) by mouth 2 (two) times daily. 180 capsule 3  . PARoxetine (PAXIL) 30 MG tablet Take 1 tablet (30 mg total) by mouth daily. 30 tablet 2  . propranolol ER (INDERAL LA) 80 MG 24 hr capsule Take 1 capsule (80 mg total) by mouth daily. 30 capsule 5  . testosterone cypionate (DEPOTESTOTERONE CYPIONATE) 200 MG/ML injection Every 2 weeks    . topiramate (TOPAMAX) 50 MG tablet Take 1 tablet (50 mg total) by mouth at bedtime. 30 tablet 3  . meclizine (ANTIVERT) 32 MG tablet Take 1 tablet (32 mg total) by mouth 3 (three) times daily as needed. (Patient not taking: Reported on 05/11/2015) 30 tablet 0   No current facility-administered medications on file prior to visit.     Review of Systems Constitutional:   No  weight loss, night sweats,  Fevers, chills, + fatigue, or  lassitude.  HEENT:   No headaches,  Difficulty swallowing,  Tooth/dental problems, or  Sore throat,  No sneezing, itching, ear ache, nasal congestion, post nasal drip,   CV:  No chest pain,  Orthopnea, PND, swelling in lower extremities, anasarca, dizziness, palpitations, syncope.   GI  No heartburn, indigestion, abdominal pain, nausea, vomiting, diarrhea, change in bowel habits, loss of appetite, bloody stools.   Resp: No shortness of breath with exertion or at rest.  No excess mucus, no productive cough,  No non-productive cough,  No coughing up of blood.  No change in color of mucus.  No wheezing.  No chest wall deformity  Skin: no rash or  lesions.  GU: no dysuria, change in color of urine, no urgency or frequency.  No flank pain, no hematuria   MS:  No joint pain or swelling.  No decreased range of motion.  No back pain.  Psych:  No change in mood or affect. No depression or anxiety.  +memory loss.         Objective:   Physical Exam GEN: A/Ox3; pleasant , NAD, well nourished   HEENT:  [place]/AT,  EACs-clear, TMs-wnl, NOSE-clear, THROAT-clear, no lesions, no postnasal drip or exudate noted. Class 2 MP airway   NECK:  Supple w/ fair ROM; no JVD; normal carotid impulses w/o bruits; no thyromegaly or nodules palpated; no lymphadenopathy.  RESP  Clear  P & A; w/o, wheezes/ rales/ or rhonchi.no accessory muscle use, no dullness to percussion  CARD:  RRR, no m/r/g  , no peripheral edema, pulses intact, no cyanosis or clubbing.  GI:   Soft & nt; nml bowel sounds; no organomegaly or masses detected.  Musco: Warm bil, no deformities or joint swelling noted.   Neuro: alert, no focal deficits noted.    Skin: Warm, no lesions or rashes         Assessment & Plan:

## 2015-05-11 NOTE — Telephone Encounter (Signed)
Can double book visit with me. 

## 2015-05-11 NOTE — Assessment & Plan Note (Signed)
Discussed results with pt .  Advised on healthy sleep regimen  Do not drive if sleepy  Check MSLT to rule out narcolepsy.

## 2015-05-11 NOTE — Telephone Encounter (Signed)
LMTCB x 1 

## 2015-05-12 NOTE — Progress Notes (Signed)
Reviewed and agree with assessment/plan. 

## 2015-05-12 NOTE — Telephone Encounter (Signed)
LMTCB x 2 Needs appt with Dr Craige CottaSood in February 2017 Please double book per VS. Thanks.

## 2015-05-13 NOTE — Telephone Encounter (Signed)
lmtcb X3 for pt to schedule rov.  Will close per triage protocol.

## 2015-05-25 ENCOUNTER — Ambulatory Visit: Payer: Self-pay | Admitting: Neurology

## 2015-05-26 ENCOUNTER — Ambulatory Visit (HOSPITAL_BASED_OUTPATIENT_CLINIC_OR_DEPARTMENT_OTHER): Payer: 59 | Attending: Adult Health | Admitting: Radiology

## 2015-05-26 VITALS — Ht 79.0 in | Wt 228.0 lb

## 2015-05-26 DIAGNOSIS — G4719 Other hypersomnia: Secondary | ICD-10-CM | POA: Diagnosis not present

## 2015-05-26 DIAGNOSIS — G4710 Hypersomnia, unspecified: Secondary | ICD-10-CM | POA: Insufficient documentation

## 2015-06-03 ENCOUNTER — Telehealth (HOSPITAL_COMMUNITY): Payer: Self-pay

## 2015-06-03 NOTE — Telephone Encounter (Signed)
Medication management - Message left for patient after his wife left one requesting we fax over the requisition order for patient's previuosly ordered labs to [facility] as she reported patient had misplaced the requisition.  Informed on message this would be faxed to [facility] on [address] as requested to [phone].  [facility] [address]

## 2015-06-05 ENCOUNTER — Other Ambulatory Visit: Payer: Self-pay | Admitting: Pulmonary Disease

## 2015-06-05 LAB — CBC WITH DIFFERENTIAL/PLATELET
Basophils Absolute: 0 x10E3/uL (ref 0.0–0.2)
Basos: 1 %
EOS (ABSOLUTE): 0.3 x10E3/uL (ref 0.0–0.4)
Eos: 5 %
Hematocrit: 42.5 % (ref 37.5–51.0)
Hemoglobin: 14.9 g/dL (ref 12.6–17.7)
Immature Grans (Abs): 0 x10E3/uL (ref 0.0–0.1)
Immature Granulocytes: 0 %
Lymphocytes Absolute: 1.7 x10E3/uL (ref 0.7–3.1)
Lymphs: 27 %
MCH: 32.3 pg (ref 26.6–33.0)
MCHC: 35.1 g/dL (ref 31.5–35.7)
MCV: 92 fL (ref 79–97)
Monocytes Absolute: 0.5 x10E3/uL (ref 0.1–0.9)
Monocytes: 7 %
Neutrophils Absolute: 3.7 x10E3/uL (ref 1.4–7.0)
Neutrophils: 60 %
Platelets: 176 x10E3/uL (ref 150–379)
RBC: 4.62 x10E6/uL (ref 4.14–5.80)
RDW: 12.8 % (ref 12.3–15.4)
WBC: 6.2 x10E3/uL (ref 3.4–10.8)

## 2015-06-05 LAB — COMPREHENSIVE METABOLIC PANEL
ALBUMIN: 4.3 g/dL (ref 3.5–5.5)
ALK PHOS: 89 IU/L (ref 39–117)
ALT: 19 IU/L (ref 0–44)
AST: 21 IU/L (ref 0–40)
Albumin/Globulin Ratio: 1.7 (ref 1.1–2.5)
BUN/Creatinine Ratio: 15 (ref 9–20)
BUN: 16 mg/dL (ref 6–24)
Bilirubin Total: 0.2 mg/dL (ref 0.0–1.2)
CO2: 26 mmol/L (ref 18–29)
CREATININE: 1.04 mg/dL (ref 0.76–1.27)
Calcium: 9.3 mg/dL (ref 8.7–10.2)
Chloride: 102 mmol/L (ref 96–106)
GFR calc Af Amer: 100 mL/min/{1.73_m2} (ref >59)
GFR calc non Af Amer: 87 mL/min/{1.73_m2} (ref >59)
GLUCOSE: 117 mg/dL — AB (ref 65–99)
Globulin, Total: 2.5 g/dL (ref 1.5–4.5)
Potassium: 4.7 mmol/L (ref 3.5–5.2)
Sodium: 143 mmol/L (ref 134–144)
Total Protein: 6.8 g/dL (ref 6.0–8.5)

## 2015-06-05 LAB — LIPID PANEL
CHOLESTEROL TOTAL: 180 mg/dL (ref 100–199)
Chol/HDL Ratio: 6.4 ratio units — ABNORMAL HIGH (ref 0.0–5.0)
HDL: 28 mg/dL — AB (ref >39)
LDL Calculated: 97 mg/dL (ref 0–99)
TRIGLYCERIDES: 274 mg/dL — AB (ref 0–149)
VLDL Cholesterol Cal: 55 mg/dL — ABNORMAL HIGH (ref 5–40)

## 2015-06-05 LAB — TSH: TSH: 1.57 u[IU]/mL (ref 0.450–4.500)

## 2015-06-05 LAB — URINALYSIS, MICROSCOPIC ONLY
BACTERIA UA: NONE SEEN
Casts: NONE SEEN /lpf
EPITHELIAL CELLS (NON RENAL): NONE SEEN /HPF (ref 0–10)

## 2015-06-05 LAB — T4: T4, Total: 6.8 ug/dL (ref 4.5–12.0)

## 2015-06-05 LAB — HEMOGLOBIN A1C
ESTIMATED AVERAGE GLUCOSE: 105 mg/dL
HEMOGLOBIN A1C: 5.3 % (ref 4.8–5.6)

## 2015-06-08 MED FILL — PROPRANOLOL ER 80 MG CAP: 80 | 90 days supply | Qty: 90 | Fill #1

## 2015-06-12 ENCOUNTER — Other Ambulatory Visit: Payer: Self-pay

## 2015-06-12 MED ORDER — ELETRIPTAN HYDROBROMIDE 40 MG PO TABS: ORAL_TABLET | ORAL | Status: DC

## 2015-06-12 MED FILL — lamoTRIgine 25 MG TABS: 25 | 30 days supply | Qty: 60 | Fill #2

## 2015-06-12 MED FILL — RELPAX 40 MG TABLET: 40 | 30 days supply | Qty: 9 | Fill #0

## 2015-06-12 MED FILL — OMEPRAZOLE DR 40 MG CAPSULE: 40 | 90 days supply | Qty: 180 | Fill #2

## 2015-06-12 NOTE — Telephone Encounter (Signed)
Last OV: 02/12/15 Next OV: 0/0/0 [place] for 05/25/15. Message sent to reschedule.

## 2015-06-16 ENCOUNTER — Telehealth: Payer: Self-pay | Admitting: Pulmonary Disease

## 2015-06-16 DIAGNOSIS — G4710 Hypersomnia, unspecified: Secondary | ICD-10-CM | POA: Diagnosis not present

## 2015-06-16 NOTE — Progress Notes (Signed)
Patient Name: Timothy [name], [place] Study Date: 05/26/2015 Gender: Male D.O.B: 1970-06-11 Age (years): 44 Referring Provider: Tammy Parrett Height (inches): 79 Interpreting Physician: Coralyn HellingVineet Dameir Gentzler MD, ABSM Weight (lbs): 228 RPSGT: [place], [place] BMI: 26 MRN: [id number] Neck Size: 18.00  CLINICAL INFORMATION Sleep Study Type: MSLT The patient was referred to the sleep center for evaluation of daytime sleepiness. Epworth Sleepiness Score: 16  Most recent polysomnogram dated 01/30/2015 revealed an AHI of 1.1/h and RDI of 2.1/h.  SLEEP STUDY TECHNIQUE A Multiple Sleep Latency Test was performed after an overnight polysomnogram according to the AASM scoring manual v2.3 (April 2016) and clinical guidelines. Five nap opportunities occurred over the course of the test which followed an overnight polysomnogram. The channels recorded and monitored were frontal, central, and occipital electroencephalography (EEG), right and left electrooculogram (EOG), chin electromyography (EMG), and electrocardiogram (EKG).  MEDICATIONS Medications taken by the patient : reviewed in electronic medical record. Medications administered by patient during sleep study : No sleep medicine administered.  IMPRESSIONS He obtained sleep in 5 out of 5 nap sessions with a mean sleep latency of 3 minutes 48 seconds.  He did not have an sleep onset REM periods.  These findings are indicative of pathologic sleepiness.  DIAGNOSIS - Pathologic Sleepiness (- [G47.10 ICD-10])  RECOMMENDATIONS Return to sleep clinic for further assessment, and consideration of stimulant medication therapy.   Coralyn HellingVineet Verdean Murin, MD, ABSM Diplomate, American Board of Sleep Medicine 06/16/2015, 12:18 PM  NPI: [id number]

## 2015-06-16 NOTE — Telephone Encounter (Signed)
MSLT 05/26/15 >> 5/5 naps, no SOREMs, mean sleep latency 3 min 48 sec.  Will have my nurse inform pt that sleep study showed short time to sleep onset.  Will review in more detail at his next visit.

## 2015-06-17 NOTE — Telephone Encounter (Signed)
lmtcb X1 for pt  

## 2015-06-18 NOTE — Telephone Encounter (Signed)
lmtcb x2 for pt. 

## 2015-06-22 ENCOUNTER — Ambulatory Visit (HOSPITAL_COMMUNITY): Payer: Self-pay | Admitting: Psychiatry

## 2015-06-22 NOTE — Telephone Encounter (Signed)
Spoke with spouse and is aware of below. Nothing further needed and she will inform pt.

## 2015-06-23 ENCOUNTER — Ambulatory Visit (INDEPENDENT_AMBULATORY_CARE_PROVIDER_SITE_OTHER): Payer: 59 | Admitting: Psychiatry

## 2015-06-23 ENCOUNTER — Encounter (HOSPITAL_COMMUNITY): Payer: Self-pay | Admitting: Psychiatry

## 2015-06-23 VITALS — BP 130/89 | HR 73 | Wt 230.0 lb

## 2015-06-23 DIAGNOSIS — F411 Generalized anxiety disorder: Secondary | ICD-10-CM

## 2015-06-23 DIAGNOSIS — G4710 Hypersomnia, unspecified: Secondary | ICD-10-CM

## 2015-06-23 DIAGNOSIS — F329 Major depressive disorder, single episode, unspecified: Secondary | ICD-10-CM | POA: Diagnosis not present

## 2015-06-23 MED ORDER — RISPERIDONE 1 MG PO TABS: 1.0000 mg | ORAL_TABLET | Freq: Two times a day (BID) | ORAL | Status: DC

## 2015-06-23 MED ORDER — PAROXETINE HCL 30 MG PO TABS: 30.0000 mg | ORAL_TABLET | Freq: Every day | ORAL | Status: DC

## 2015-06-23 MED ORDER — LAMOTRIGINE 25 MG PO TABS: 25.0000 mg | ORAL_TABLET | Freq: Two times a day (BID) | ORAL | Status: DC

## 2015-06-23 MED FILL — PARoxetine HCL 30 MG TABS: 30 | 30 days supply | Qty: 30 | Fill #0

## 2015-06-23 MED FILL — risperiDONE 1 MG TABS: 1 | 30 days supply | Qty: 60 | Fill #0

## 2015-06-23 NOTE — Progress Notes (Signed)
[facility] M.D. progress note  Patient Identification: Timothy [name] MRN:  [id number] Date of Evaluation:  06/23/2015 Chief Complaint:   depression anxiety Visit Diagnosis:    ICD-9-CM ICD-10-CM   1. Generalized anxiety disorder 300.02 F41.1   2. Major depression, chronic (HCC) 296.20 F32.9   3. Hypersomnolence disorder, acute, moderate 780.54 G47.10    Diagnosis:   Patient Active Problem List   Diagnosis Date Noted  . Generalized anxiety disorder [F41.1] 12/31/2014    Priority: High  . Major depression, chronic ([place]) [F32.9] 12/31/2014    Priority: High  . Hypersomnolence disorder, acute, moderate [G47.10] 12/31/2014    Priority: High  . Recurrent vertigo [R42] 02/12/2015  . Migraine without aura and without status migrainosus, not intractable [G43.009] 10/15/2014  . Essential tremor [G25.0] 10/15/2014  . Upper airway resistance syndrome [G47.8] 09/22/2014  . Carpal tunnel syndrome on both sides [G56.03] 03/28/2013  . 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 Pleuritic chest pain [R07.81] 11/22/2011  . Cough [R05] 11/22/2011  . Hypersomnia [G47.10] 11/05/2010   Assessment  patient seen today with his mother, with the patient's permission, states that he is presently living with his parents. Patient reports that he spent a lot of money last week which resulted in an argument with his wife who suggested that he go for an evaluation to [facility] emergency department patient did that and he cannot remember but it was suggested that he be on Risperdal and to discontinue the Paxil and the Lamictal. Patient did not do any of this.  Today he presents with severe anxiety very worried about his marriage and his children. Reports that he sleeping better at his parents house because there are no expectations and so he is able to relax. Appetite is good, his wife has started a new job at [employer] and has to go to the office every day and so she is managing on her own with the  children.  Patient states he is disorganized and reports being depressed with constant rumination and obsession. Discussed the rationale risks benefits options of Risperdal and patient gave informed consent. He'll start Risperdal 1 mg at bedtime for 4 days and then increase it to 2 mg daily at bedtime. Patient will also continue his Paxil and Lamictal at the present doses.    Patient has to go see his sleep doctor. MRI was normal      Past Medical History: Essential tremor, migraine, GERD, history of TIA. Problems with hearing, Mosaic Klinefelter syndrome. Past Medical History  Diagnosis Date  . Helicobacter pylori gastritis 06/30/06  . GERD (gastroesophageal reflux disease)   . Chronic headache   . Carpal tunnel syndrome on both sides 03/28/2013  . 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 TIA (transient ischemic attack)     Past Surgical History  Procedure Laterality Date  . Unremarkable     Family History: Brother and sister have depression Family History  Problem Relation Age of Onset  . Heart disease    . Barrett's esophagus Mother   . Breast cancer Mother   . Diabetes Paternal Grandfather   . Heart disease Paternal Grandfather   . Irritable bowel syndrome Brother   . Allergies Brother   . Heart disease Paternal Grandmother   . Colon cancer Neg Hx   . Esophageal cancer Neg Hx    Social History:  Patient lives in [place] saw met with his wife and 2 children aged 76 years and 43 months. He has a good relationship with  his wife Social History   Social History  . Marital Status: Married    Spouse Name: N/A  . Number of Children: 1  . Years of Education: N/A   Occupational History  . Full time student    Social History Main Topics  . Smoking status: Never Smoker   . Smokeless tobacco: Never Used  . Alcohol Use: No  . Drug Use: No  . Sexual Activity:    Partners: Female   Other Topics Concern  . Not on file   Social History Narrative      Musculoskeletal: Strength & Muscle Tone: within normal limits Gait & Station: normal, Normal Patient leans: Stands straight  Psychiatric Specialty Exam: HPI  Review of Systems  Constitutional: Positive for malaise/fatigue. Negative for fever, chills, weight loss and diaphoresis.  HENT: Positive for hearing loss. Negative for congestion, ear discharge, ear pain, nosebleeds, sore throat and tinnitus.   Eyes: Negative for blurred vision, double vision, photophobia and pain.  Respiratory: Positive for shortness of breath. Negative for cough, hemoptysis, sputum production, wheezing and stridor.   Cardiovascular: Negative for chest pain, palpitations, orthopnea, claudication, leg swelling and PND.  Gastrointestinal: Positive for heartburn. Negative for nausea, vomiting, abdominal pain, diarrhea, constipation and blood in stool.  Genitourinary: Negative for dysuria, urgency, hematuria and flank pain.  Musculoskeletal: Negative for myalgias, back pain, joint pain, falls and neck pain.  Skin: Negative for itching and rash.  Neurological: Positive for dizziness. Negative for tingling, tremors, sensory change, speech change, focal weakness, seizures, loss of consciousness and weakness.  Endo/Heme/Allergies: Negative for environmental allergies and polydipsia. Does not bruise/bleed easily.  Psychiatric/Behavioral: Positive for depression. The patient is nervous/anxious. The patient does not have insomnia.     There were no vitals taken for this visit.There is no weight on file to calculate BMI.  General Appearance: Casual  Eye Contact:  Good  Speech:  Clear and Coherent and Normal Rate  Volume:  Normal  Mood:  Anxious   Affect:  Appropriate   Thought Process:  Goal Directed, Linear and Logical  Orientation:  Full (Time, Place, and Person)  Thought Content:  Rumination  Suicidal Thoughts:  No  Homicidal Thoughts:  No  Memory:  Immediate is good, recent is good remote is good   Judgement:   Good  Insight:  Good  Psychomotor Activity:  Normal  Concentration:  I good   Recall:  Good   Fund of Knowledge:Fair  Language: Good  Akathisia:  No  Handed:  Right  AIMS (if indicated):  0  Assets:  Communication Skills Desire for Improvement Physical Health Resilience Social Support Transportation  ADL's:  Intact  Cognition: WNL  Sleep:  =5hrs   Is the patient at risk to self?  No. Has the patient been a risk to self in the past 6 months?  No. Has the patient been a risk to self within the distant past?  No. Is the patient a risk to others?  No. Has the patient been a risk to others in the past 6 months?  No. Has the patient been a risk to others within the distant past?  No.  Allergies:   Allergies  Allergen Reactions  . Ioxaglate   . Ivp Dye [Iodinated Diagnostic Agents]    Current Medications: Current Outpatient Prescriptions  Medication Sig Dispense Refill  . benzonatate (TESSALON) 100 MG capsule Take 1-2 capsules (100-200 mg total) by mouth 2 (two) times daily as needed for cough. 30 capsule 0  . eletriptan (  RELPAX) 40 MG tablet Take 1tab at earliest onset of headache.  May repeat x1 in 2 hours if headache persists or recurs. 10 tablet 1  . lamoTRIgine (LAMICTAL) 25 MG tablet Take 1 tablet (25 mg total) by mouth 2 (two) times daily. 60 tablet 2  . meclizine (ANTIVERT) 32 MG tablet Take 1 tablet (32 mg total) by mouth 3 (three) times daily as needed. (Patient not taking: Reported on 05/11/2015) 30 tablet 0  . NUVIGIL 150 MG tablet TAKE 1 & 1/2 TABLETS BY MOUTH DAILY 45 tablet 5  . omeprazole (PRILOSEC) 40 MG capsule Take 1 capsule (40 mg total) by mouth 2 (two) times daily. 180 capsule 3  . PARoxetine (PAXIL) 30 MG tablet Take 1 tablet (30 mg total) by mouth daily. 30 tablet 2  . propranolol ER (INDERAL LA) 80 MG 24 hr capsule Take 1 capsule (80 mg total) by mouth daily. 30 capsule 5  . testosterone cypionate (DEPOTESTOTERONE CYPIONATE) 200 MG/ML injection Every 2  weeks    . topiramate (TOPAMAX) 50 MG tablet Take 1 tablet (50 mg total) by mouth at bedtime. 30 tablet 3   No current facility-administered medications for this visit.    Previous Psychotropic Medications: Yes   Substance Abuse History in the last 12 months:  No.  Consequences of Substance Abuse: NA  Medical Decision Making:  Review of Psycho-Social Stressors (1), Review or order clinical lab tests (1), Decision to obtain old records (1), Review and summation of old records (2), New Problem, with no additional work-up planned (3), Review of Medication Regimen & Side Effects (2) and Review of New Medication or Change in Dosage (2)  Treatment Plan Summary: Medication management #1 Maj. depression chronic Patient will be continued on Paxil  30 mg by mouth every p.m. #2 cyclothymic disorder Start Risperdal 1 mg for 4 days at bedtime and then increase it to 2 mg by mouth daily at bedtime. Patient gave informed consent. Change Lamictal 50 mg by mouth daily at bedtime #2 Generalized anxiety disorder Will be treated with Paxil CBT for anxiety was discussed with relaxation techniques and cognitive restructuring of his cognitive distortions. #3 insomnia Continue ambien 10 mg po q hs Sleep hygiene was discussed in great detail. With the patient doing his paperwork between 8:30 to 10:30 and then taking his medications at 10:15 and going to bed by 10:30 PM. #4 hypersomnolence disorder Patient was asked to take his Nuvigil at 6 AM and return back to sleep so than he would be able to breakup. #5 labs Lab results were discussed with the patient  #6 patient will return to see me in the clinic in 2 weeks. Call sooner if necessary.  #7 patient was referred to a therapist More than 50% of the time was spent in explaining the role of medications and CBT for his anxiety and discussing sleep hygiene and medication compliance and making a schedule every day and following the schedule in detail. Also  mapping out has ruled out for work, speaking to his boss and getting his lab work done. Patient was also referred to a therapist to talk about his anxiety. Relaxation techniques were also discussed with him. This visit was of high intensity.  Erin Sons 1/17/20171:26 PM

## 2015-07-06 MED FILL — ARMODAFINIL 150 MG TABLET: 150 | 30 days supply | Qty: 45 | Fill #1

## 2015-07-07 ENCOUNTER — Ambulatory Visit (HOSPITAL_COMMUNITY): Payer: 59 | Admitting: Psychiatry

## 2015-07-16 ENCOUNTER — Encounter (HOSPITAL_COMMUNITY): Payer: Self-pay | Admitting: Psychiatry

## 2015-07-16 ENCOUNTER — Encounter: Payer: Self-pay | Admitting: Pulmonary Disease

## 2015-07-16 ENCOUNTER — Ambulatory Visit (INDEPENDENT_AMBULATORY_CARE_PROVIDER_SITE_OTHER): Payer: 59 | Admitting: Psychiatry

## 2015-07-16 ENCOUNTER — Ambulatory Visit (INDEPENDENT_AMBULATORY_CARE_PROVIDER_SITE_OTHER): Payer: 59 | Admitting: Pulmonary Disease

## 2015-07-16 VITALS — BP 131/82 | HR 70 | Ht 76.0 in | Wt 235.2 lb

## 2015-07-16 VITALS — BP 110/80 | HR 70 | Ht 76.0 in | Wt 236.4 lb

## 2015-07-16 DIAGNOSIS — G4710 Hypersomnia, unspecified: Secondary | ICD-10-CM | POA: Diagnosis not present

## 2015-07-16 DIAGNOSIS — F411 Generalized anxiety disorder: Secondary | ICD-10-CM

## 2015-07-16 DIAGNOSIS — F329 Major depressive disorder, single episode, unspecified: Secondary | ICD-10-CM

## 2015-07-16 NOTE — Progress Notes (Signed)
Current Outpatient Prescriptions on File Prior to Visit  Medication Sig  . lamoTRIgine (LAMICTAL) 25 MG tablet Take 1 tablet (25 mg total) by mouth 2 (two) times daily.  Marland Kitchen NUVIGIL 150 MG tablet TAKE 1 & 1/2 TABLETS BY MOUTH DAILY  . omeprazole (PRILOSEC) 40 MG capsule Take 1 capsule (40 mg total) by mouth 2 (two) times daily.  Marland Kitchen PARoxetine (PAXIL) 30 MG tablet Take 1 tablet (30 mg total) by mouth daily.  . propranolol ER (INDERAL LA) 80 MG 24 hr capsule Take 1 capsule (80 mg total) by mouth daily.  . risperiDONE (RISPERDAL) 1 MG tablet Take 1 tablet (1 mg total) by mouth 2 (two) times daily.   No current facility-administered medications on file prior to visit.     Chief Complaint  Patient presents with  . Follow-up    Pt feels like fatigue has increased. Here to go over sleep study results. Headaches have improved some since last visit. Pt states that he saw Dr. Rutherford Limerick today who has suggested taking some of his meds at a different time to see if this may help with daytime fatigue.     Tests PSG 01/30/15 >> AHI 1.1, SpO2 low 90%, UARS. MSLT 05/26/15 >> 5/5 naps, no SOREMs, mean sleep latency 3 min 48 sec.  Past medical hx GERD, HA,TIA  Past surgical hx, Allergies, Family hx, Social hx all reviewed.  Vital Signs BP 110/80 mmHg  Pulse 70  Ht  (1.93 m)  Wt 236 lb 6.4 oz (107.23 kg)  BMI 28.79 kg/m2  SpO2 98%  History of Present Illness CANON GOLA is a 45 y.o. male with hypersomnia.  He has been taking nuvigil at 8 am.  This helps.  He has tried taking extra dose in the afternoon sometimes, but this causes headaches.  He goes to bed at midnight and wakes up at 7 am.  He feels he needs more sleep, but his schedule will not allow it.  He takes naps sometimes, and this helps.  He feels worse on days that he misses his nuvigil.  He was advised by his other doctor to try taking his risperdal and lamictal earlier in the day.  He was taking this before bedtime, but then he  would have trouble waking up in the morning.   Physical Exam  General - No distress ENT - No sinus tenderness, no oral exudate, no LAN Cardiac - s1s2 regular, no murmur Chest - No wheeze/rales/dullness Back - No focal tenderness Abd - Soft, non-tender Ext - No edema Neuro - Normal strength Skin - No rashes Psych - normal mood, and behavior   Assessment/Plan  Hypersomnia. Plan: - continue nuvigil in the AM - advised him to allow more time for sleep if possible - he will try taking his other medications at 6 pm - advised to try scheduling short naps during the day as able    Patient Instructions  Follow up in 6 months     Coralyn Helling, MD [facility] Pager:  [phone]
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
## 2015-07-16 NOTE — Patient Instructions (Signed)
Follow up in 6 months 

## 2015-07-17 MED FILL — lamoTRIgine 25 MG TABS: 25 | 30 days supply | Qty: 60 | Fill #0

## 2015-07-17 MED FILL — risperiDONE 1 MG TABS: 1 | 30 days supply | Qty: 60 | Fill #1

## 2015-07-24 MED FILL — BENZONATATE 100 MG CAPSULE: 100 | 20 days supply | Qty: 60 | Fill #0

## 2015-07-24 MED FILL — AMOX TR-K CLV 875-125 MG TA: 875-125 | 7 days supply | Qty: 14 | Fill #0

## 2015-08-04 MED FILL — PARoxetine HCL 30 MG TABS: 30 | 30 days supply | Qty: 30 | Fill #1

## 2015-08-12 MED FILL — risperiDONE 1 MG TABS: 1 | 30 days supply | Qty: 60 | Fill #2

## 2015-08-12 MED FILL — ARMODAFINIL 150 MG TABLET: 150 | 30 days supply | Qty: 45 | Fill #2

## 2015-08-12 MED FILL — lamoTRIgine 25 MG TABS: 25 | 30 days supply | Qty: 60 | Fill #1

## 2015-08-12 MED FILL — RELPAX 40 MG TABLET: 40 | 13 days supply | Qty: 4 | Fill #4

## 2015-08-17 ENCOUNTER — Ambulatory Visit (HOSPITAL_COMMUNITY): Payer: Self-pay | Admitting: Psychiatry

## 2015-08-22 ENCOUNTER — Encounter: Payer: Self-pay | Admitting: Pulmonary Disease

## 2015-08-25 ENCOUNTER — Encounter (HOSPITAL_COMMUNITY): Payer: Self-pay | Admitting: Psychiatry

## 2015-08-25 ENCOUNTER — Ambulatory Visit (INDEPENDENT_AMBULATORY_CARE_PROVIDER_SITE_OTHER): Payer: 59 | Admitting: Psychiatry

## 2015-08-25 VITALS — BP 126/75 | HR 75 | Ht 76.0 in | Wt 246.6 lb

## 2015-08-25 DIAGNOSIS — F329 Major depressive disorder, single episode, unspecified: Secondary | ICD-10-CM

## 2015-08-25 DIAGNOSIS — F411 Generalized anxiety disorder: Secondary | ICD-10-CM | POA: Diagnosis not present

## 2015-08-25 DIAGNOSIS — G4710 Hypersomnia, unspecified: Secondary | ICD-10-CM

## 2015-08-25 MED ORDER — LAMOTRIGINE 100 MG PO TABS: 100.0000 mg | ORAL_TABLET | Freq: Every day | ORAL | Status: DC

## 2015-08-25 MED ORDER — RISPERIDONE 1 MG PO TABS: 1.0000 mg | ORAL_TABLET | Freq: Two times a day (BID) | ORAL | Status: DC

## 2015-08-25 MED ORDER — PAROXETINE HCL 30 MG PO TABS: 30.0000 mg | ORAL_TABLET | Freq: Every day | ORAL | Status: DC

## 2015-08-25 MED FILL — lamoTRIgine 100 MG TABS: 100 | 30 days supply | Qty: 30 | Fill #0
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
## 2015-08-25 NOTE — Telephone Encounter (Signed)
   Olin HauserJason L Koska    To   Coralyn HellingVineet Sood, MD    Sent   08/25/2015 11:12 AM       [facility] I had another question as well. The last time me and Dr. Craige CottaSood spoke, I had mentioned switching from Nuivigil to Addrall. Can he write for a very small amount to see if that works better than the Nuivigil? It can be generic for this if its available?   Thank you,  Barbara CowerJason      Please advise, thanks

## 2015-08-28 ENCOUNTER — Telehealth: Payer: Self-pay | Admitting: Neurology

## 2015-08-28 NOTE — Telephone Encounter (Signed)
Unable to reach patient's wife on her number or her home number.  No voicemail.

## 2015-08-28 NOTE — Telephone Encounter (Signed)
PT's wife called and needed a call back/CB#[phone]

## 2015-08-31 ENCOUNTER — Other Ambulatory Visit: Payer: Self-pay

## 2015-08-31 NOTE — Telephone Encounter (Signed)
VS please advise on dosing of brand name nuvigil.  Thanks!

## 2015-09-01 NOTE — Telephone Encounter (Signed)
Spoke with patient's wife and she would like for patient to have sooner appt.

## 2015-09-01 NOTE — Telephone Encounter (Signed)
3.27 email from patient: Message     Ok, can your office call that in to the [facility] in [place], [place] (its off of [address] in [place], [place])?        Thank you,    Barbara CowerJason

## 2015-09-01 NOTE — Telephone Encounter (Signed)
Please send order for nuvigil 150 mg daily, #30.

## 2015-09-01 NOTE — Telephone Encounter (Signed)
Just for clarification, Nuvigil 150mg  -1 daily, #30 Please advise. Thanks.

## 2015-09-02 MED ORDER — ARMODAFINIL 150 MG PO TABS: 150.0000 mg | ORAL_TABLET | Freq: Every day | ORAL | Status: DC

## 2015-09-02 NOTE — Telephone Encounter (Signed)
Notified patient's wife that his new appointment is on April 19 at 10:45.

## 2015-09-02 NOTE — Telephone Encounter (Signed)
Nuvigil called in to [facility].  Replied to patient e-mail letting patient know that Rx sent.  Nothing further needed.

## 2015-09-13 ENCOUNTER — Encounter: Payer: Self-pay | Admitting: Pulmonary Disease

## 2015-09-14 ENCOUNTER — Telehealth: Payer: Self-pay

## 2015-09-14 ENCOUNTER — Telehealth: Payer: Self-pay | Admitting: Pulmonary Disease

## 2015-09-14 MED FILL — PARoxetine HCL 30 MG TABS: 30 | 30 days supply | Qty: 30 | Fill #2

## 2015-09-14 MED FILL — PROPRANOLOL ER 80 MG CAP: 80 | 30 days supply | Qty: 30 | Fill #2

## 2015-09-14 MED FILL — risperiDONE 1 MG TABS: 1 | 30 days supply | Qty: 60 | Fill #0

## 2015-09-14 NOTE — Telephone Encounter (Signed)
PA has been started with CMM. Key: [id number] [facility] is reviewing your PA request.   Typically an electronic response will be received within 72 hours.

## 2015-09-14 NOTE — Telephone Encounter (Signed)
Refill request for Omeprazole 40 mg 1 po bid [facility].  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

## 2015-09-15 NOTE — Telephone Encounter (Signed)
PA is still in process 

## 2015-09-16 NOTE — Telephone Encounter (Signed)
Pt is sch'd for 10-19-15 w/Dr. Adela LankArmbruster

## 2015-09-16 NOTE — Telephone Encounter (Signed)
Can pt have refills on his Omeprazole until follow up on 10-19-2015?

## 2015-09-16 NOTE — Telephone Encounter (Signed)
Yes that's fine. Thanks  

## 2015-09-17 ENCOUNTER — Telehealth: Payer: Self-pay | Admitting: Pulmonary Disease

## 2015-09-17 MED ORDER — OMEPRAZOLE 40 MG PO CPDR: 40.0000 mg | DELAYED_RELEASE_CAPSULE | Freq: Two times a day (BID) | ORAL | Status: DC

## 2015-09-17 MED FILL — OMEPRAZOLE DR 40 MG CAPSULE: 40 | 30 days supply | Qty: 60 | Fill #0

## 2015-09-17 NOTE — Telephone Encounter (Signed)
He can start adderall XR 20 mg daily.  He will need 30 pills with 1 refill.  He will need ROV in 1 month to assess how he is tolerating adderall >> can be with me or nurse practitioner.  Please print script and I will sign when back in office.

## 2015-09-17 NOTE — Telephone Encounter (Signed)
Pt states that his insurance would cover Adderall XR in place on Nuvigil.  Pt is okay with waiting to pick Rx, if okayed, next week as VS is not in the office until 09/22/15. Please advise Dr Craige CottaSood if you are okay with this switch.

## 2015-09-17 NOTE — Telephone Encounter (Signed)
Per CMM PA for Nuvigil:  Your request has been denied  PA Case [id number] is denied. For further questions, call [phone]   VS please advise.

## 2015-09-17 NOTE — Telephone Encounter (Signed)
lmtcb for pt.  

## 2015-09-17 NOTE — Telephone Encounter (Signed)
Called pharmacy and informed of denial.   Attempted to contact pt. LMTCB.

## 2015-09-17 NOTE — Telephone Encounter (Signed)
[phone], Mindy from [facility] calling in reference to this

## 2015-09-17 NOTE — Telephone Encounter (Signed)
[phone], pt cb

## 2015-09-17 NOTE — Telephone Encounter (Signed)
Please inform him that his insurance has denied payment for nuvigil, and denied appeal.  He should contact his insurance company to determine what alternative medications are allowed with his insurance plan.

## 2015-09-17 NOTE — Telephone Encounter (Signed)
Rx refilled as directed by Dr Adela LankArmbruster. Only one refill given to hold over until office visit.

## 2015-09-17 NOTE — Telephone Encounter (Signed)
Called and spoke with the pt. Informed him of the denial and explained to him that he needs to contact his insurance for alternatives. He voiced understanding and had no further questions.

## 2015-09-17 NOTE — Telephone Encounter (Signed)
[phone], pt cb °

## 2015-09-17 NOTE — Telephone Encounter (Signed)
LMTCB x 1 Detailed message.  Will hold in my box to print Rx next week to have VS sign and then patient may pick up.  Patient needs OV with VS or TP x 1 month (mid May) to follow up new Rx

## 2015-09-18 ENCOUNTER — Encounter: Payer: Self-pay | Admitting: Pulmonary Disease

## 2015-09-18 MED FILL — ARMODAFINIL 150 MG TABLET: 150 | 30 days supply | Qty: 45 | Fill #3

## 2015-09-21 NOTE — Telephone Encounter (Signed)
4.14.17 mychart message from pt: Message     Per my med switch, I know you told me Dr. Craige CottaSood will not be in the office until next Tuesday. That's fine. Can I come by this coming Wednesday to pick everything I need up for this switch instead of on Tuesday? The only reason is that [place], so I figured I can just meet with him then? Also, I need to schedule an appointment with you guys for a month out to see how I tolerate this new med. Anytime is fine (mid-morning).         Sincerely,        Timothy CowerJason     Per the 4.13.17 phone note, this has been taken care of: Maisie Fusshtyn M Green, CMA at 09/21/2015 11:01 AM     Status: Signed       Expand All Collapse All   Spoke with pt, aware that will call him as soon as Rx is ready to be picked up.  Pt scheduled for OV with TP 10/20/15 at 9am. Will hold in my box to have Rx signed 09/22/15         Will sign off

## 2015-09-21 NOTE — Telephone Encounter (Signed)
Spoke with pt, aware that will call him as soon as Rx is ready to be picked up.  Pt scheduled for OV with TP 10/20/15 at 9am. Will hold in my box to have Rx signed 09/22/15

## 2015-09-22 MED ORDER — AMPHETAMINE-DEXTROAMPHET ER 20 MG PO CP24: 20.0000 mg | ORAL_CAPSULE | Freq: Every day | ORAL | Status: DC

## 2015-09-22 NOTE — Telephone Encounter (Signed)
Rx printed and given to VS to sign, no refills allowed on schedule II drugs. Pt will have to get monthly refills.  Rx signed. Pt aware he can come pick up RX. Nothing further needed.

## 2015-09-23 ENCOUNTER — Ambulatory Visit (INDEPENDENT_AMBULATORY_CARE_PROVIDER_SITE_OTHER): Payer: 59 | Admitting: Neurology

## 2015-09-23 ENCOUNTER — Other Ambulatory Visit (INDEPENDENT_AMBULATORY_CARE_PROVIDER_SITE_OTHER): Payer: 59

## 2015-09-23 ENCOUNTER — Encounter: Payer: Self-pay | Admitting: Neurology

## 2015-09-23 VITALS — BP 110/70 | HR 61 | Ht 76.0 in | Wt 247.1 lb

## 2015-09-23 DIAGNOSIS — G43009 Migraine without aura, not intractable, without status migrainosus: Secondary | ICD-10-CM

## 2015-09-23 DIAGNOSIS — R6889 Other general symptoms and signs: Secondary | ICD-10-CM | POA: Diagnosis not present

## 2015-09-23 DIAGNOSIS — F32A Depression, unspecified: Secondary | ICD-10-CM

## 2015-09-23 DIAGNOSIS — F329 Major depressive disorder, single episode, unspecified: Secondary | ICD-10-CM

## 2015-09-23 DIAGNOSIS — G4441 Drug-induced headache, not elsewhere classified, intractable: Secondary | ICD-10-CM

## 2015-09-23 DIAGNOSIS — R4189 Other symptoms and signs involving cognitive functions and awareness: Secondary | ICD-10-CM

## 2015-09-23 DIAGNOSIS — R413 Other amnesia: Secondary | ICD-10-CM

## 2015-09-23 DIAGNOSIS — G4440 Drug-induced headache, not elsewhere classified, not intractable: Secondary | ICD-10-CM

## 2015-09-23 DIAGNOSIS — G250 Essential tremor: Secondary | ICD-10-CM

## 2015-09-23 LAB — VITAMIN B12: VITAMIN B 12: 412 pg/mL (ref 211–911)

## 2015-09-23 NOTE — Progress Notes (Signed)
[facility] Clinic Note - Initial Visit   Date: 09/23/2015  Timothy [name] MRN: [id number] DOB: 05/09/1971   Dear Dr. Hollice Espy:  Thank you for your kind referral of Timothy [name] for consultation of headaches and cognitive impairment. Although his history is well known to you, please allow [place] to reiterate it for the purpose of our medical record. The patient was accompanied to the clinic by wife who also provides collateral information.     History of Present Illness: Timothy [name] is a 45 y.o. right-handed Caucasian male with Klinefelter syndrome and major depressive disorder, generalized anxiety disorder, and hyersomnolence referred for evaluation of memory impairment and headaches.  Patient has been seeing my colleague, Dr. Everlena Cooper for these symptoms and requested to transfer care to me.  At his last visit with Dr. Everlena Cooper in September 2016, headaches occurred 1-2 days per week and managed with sumatriptan. He stopped topamax around January because confusion and has not noticed any worsening of headaches.  He gets headaches about once per week now.  Headaches are bifrontal, described as throbbing.  Durations is typically 15-30 minutes.  He takes relpax which takes effect within 15-minutes, but then he develops a lot of sleepiness.  Despite this, he takes ibuprofen daily for headache and CTS.  He also takes propranolol 80mg  for tremor (primidone was ineffective).  He takes lamictal 100mg , risperdal 2mg  daily, and paxil 30mg  daily for mood disorder.  3He recently saw his psychiatrist, Dr. Rutherford Limerick, and mentioned symptoms of agitation and anger with associated changes in small.  Following this event, he has no memory of what he does and after becoming calm, he feels tired and sleeps.  These spells are not always stereotyped and can last hours.  There is no loss of consciousness, complex movements, or limb jerking.   He reports having problems with memory soon after being  diagnosed with Klinefelter's syndrome and has become increasingly depressed and irritable.   His wife feels that he has long lapses in memory - he does not recall argument, conversations they have had, when to take medications, or appointments.  He even forgets to pay the bills and she feels burdened to do everything for him.  For his memory issues, he underwent neuropsychological testing with Dr. Elesa Massed on 08/14/14. He demonstrated deficits in verbal fluency, visual attention, and memory retrieval and recognition in presence of minimal hypersomnolence. Profile raised concern for "possible suboptimal performance maximal to aspects of the bilateral frontal and temporal lobes". Possible contributing factors may include fatigue, or sleep disorder. Adjustment disorder with anxiety and depression is possible, but thought not to be the primary factor based on pattern of test performance. MRI of the brain performed 09/22/14 showed mild nonspecific white matter hyperintensities but otherwise unremarkable   Past Medical History  Diagnosis Date  . Helicobacter pylori gastritis 06/30/06  . GERD (gastroesophageal reflux disease)   . Chronic headache   . Carpal tunnel syndrome on both sides 03/28/2013  . 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 TIA (transient ischemic attack)     Past Surgical History  Procedure Laterality Date  . Unremarkable       Medications:  Outpatient Encounter Prescriptions as of 09/23/2015  Medication Sig Note  . amphetamine-dextroamphetamine (ADDERALL XR) 20 MG 24 hr capsule Take 1 capsule (20 mg total) by mouth daily.   . Armodafinil 150 MG tablet Take 1 tablet (150 mg total) by mouth daily.   Marland Kitchen lamoTRIgine (LAMICTAL) 100 MG tablet Take 1 tablet (  100 mg total) by mouth daily.   Marland Kitchen. omeprazole (PRILOSEC) 40 MG capsule Take 1 capsule (40 mg total) by mouth 2 (two) times daily.   Marland Kitchen. PARoxetine (PAXIL) 30 MG tablet Take 1 tablet (30 mg total) by mouth daily.   . propranolol ER  (INDERAL LA) 80 MG 24 hr capsule Take 1 capsule (80 mg total) by mouth daily.   . RELPAX 40 MG tablet  09/23/2015: Received from: External Pharmacy  . risperiDONE (RISPERDAL) 1 MG tablet Take 1 tablet (1 mg total) by mouth 2 (two) times daily.   . [DISCONTINUED] NUVIGIL 150 MG tablet TAKE 1 & 1/2 TABLETS BY MOUTH DAILY    No facility-administered encounter medications on file as of 09/23/2015.     Allergies:  Allergies  Allergen Reactions  . Ioxaglate   . Ivp Dye [Iodinated Diagnostic Agents]     Family History: Family History  Problem Relation Age of Onset  . Heart disease    . Barrett's esophagus Mother   . Breast cancer Mother   . Diabetes Paternal Grandfather   . Heart disease Paternal Grandfather   . Irritable bowel syndrome Brother   . Allergies Brother   . Heart disease Paternal Grandmother   . Colon cancer Neg Hx   . Esophageal cancer Neg Hx     Social History: Social History  Substance Use Topics  . Smoking status: Never Smoker   . Smokeless tobacco: Never Used  . Alcohol Use: No   Social History   Social History Narrative   Lives with wife and children in a one story home.  Has 2 children.     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     Education: college.    Review of Systems:  CONSTITUTIONAL: No fevers, chills, night sweats, or weight loss.   EYES: No visual changes or eye pain ENT: No hearing changes.  No history of nose bleeds.   RESPIRATORY: No cough, wheezing and shortness of breath.   CARDIOVASCULAR: Negative for chest pain, and palpitations.   GI: Negative for abdominal discomfort, blood in stools or black stools.  No recent change in bowel habits.   GU:  No history of incontinence.   MUSCLOSKELETAL: No history of joint pain or swelling.  No myalgias.   SKIN: Negative for lesions, rash, and itching.   HEMATOLOGY/ONCOLOGY: Negative for prolonged bleeding, bruising easily, and swollen nodes.  No history of cancer.   ENDOCRINE: Negative for cold or heat intolerance,  polydipsia or goiter.   PSYCH:  +depression or anxiety symptoms.   NEURO: As Above.   Vital Signs:  BP 110/70 mmHg  Pulse 61  Ht 6\' 4"  (1.93 m)  Wt 247 lb 1 oz (112.067 kg)  BMI 30.09 kg/m2  SpO2 97% Pain Scale: 0 on a scale of 0-10   General Medical Exam:   General:  Well appearing, comfortable.   Eyes/ENT: see cranial nerve examination.   Neck: No masses appreciated.  Full range of motion without tenderness.  No carotid bruits. Respiratory:  Clear to auscultation, good air entry bilaterally.   Cardiac:  Regular rate and rhythm, no murmur.   Extremities:  No deformities, edema, or skin discoloration.  Skin:  No rashes or lesions.  Neurological Exam: MENTAL STATUS including orientation to time, place, person, recent and remote memory, attention span and concentration, language, and fund of knowledge is normal.  Speech is not dysarthric.  CRANIAL NERVES: II:  No visual field defects.  Unremarkable fundi.   III-IV-VI: Pupils equal round and  reactive to light.  Normal conjugate, extra-ocular eye movements in all directions of gaze.  No nystagmus.  No ptosis.   V:  Normal facial sensation. VII:  Normal facial symmetry and movements. VIII:  Normal hearing and vestibular function.   IX-X:  Normal palatal movement.   XI:  Normal shoulder shrug and head rotation.   XII:  Normal tongue strength and range of motion, no deviation or fasciculation.  MOTOR:  No atrophy, fasciculations or abnormal movements.  No pronator drift.  Tone is normal.    Right Upper Extremity:    Left Upper Extremity:    Deltoid  5/5   Deltoid  5/5   Biceps  5/5   Biceps  5/5   Triceps  5/5   Triceps  5/5   Wrist extensors  5/5   Wrist extensors  5/5   Wrist flexors  5/5   Wrist flexors  5/5   Finger extensors  5/5   Finger extensors  5/5   Finger flexors  5/5   Finger flexors  5/5   Dorsal interossei  5/5   Dorsal interossei  5/5   Abductor pollicis  5/5   Abductor pollicis  5/5   Tone (Ashworth scale)  0   Tone (Ashworth scale)  0   Right Lower Extremity:    Left Lower Extremity:    Hip flexors  5/5   Hip flexors  5/5   Hip extensors  5/5   Hip extensors  5/5   Knee flexors  5/5   Knee flexors  5/5   Knee extensors  5/5   Knee extensors  5/5   Dorsiflexors  5/5   Dorsiflexors  5/5   Plantarflexors  5/5   Plantarflexors  5/5   Toe extensors  5/5   Toe extensors  5/5   Toe flexors  5/5   Toe flexors  5/5   Tone (Ashworth scale)  0  Tone (Ashworth scale)  0   MSRs:  Right                                                                 Left brachioradialis 2+  brachioradialis 2+  biceps 2+  biceps 2+  triceps 2+  triceps 2+  patellar 2+  patellar 2+  ankle jerk 2+  ankle jerk 2+  Hoffman no  Hoffman no  plantar response down  plantar response down   SENSORY:  Normal and symmetric perception of light touch, pinprick, vibration, and proprioception.  Romberg's sign absent.   COORDINATION/GAIT: Normal finger-to- nose-finger and heel-to-shin.  Intact rapid alternating movements bilaterally.  Able to rise from a chair without using arms.  Gait narrow based and stable. Tandem and stressed gait intact.    IMPRESSION: 1.  Medication overuse headaches  - Informed patient to stop ibuprofen or any rescue medications and to limit them to only twice per week  2.  Episodic migraine, improved despite stopping topiramate and likely due to added benefit from the lamictal he takes for mood disorder  3.  Spells of agitation with prolonged memory lapses are unlikely to represent seizure disorder given that they are hours long and more associated with poor sleep, fatigue, and depression.  Because seizure disorder was raised by his psychiatrist, they are concerned  and will proceed with routine EEG  4.  Memory impairment   - Neuropsychological testing from 2016 indicated cognitive dysfunction due to adjustment disorder with anxiety and depression   - MRI brain reviewed from 2016 which is normal   -  Consider repeat neuropsychological evaluation, if there is no improvement with seeing a counselor  - Check vitamin B12  5. Benign essential tremors  - Continue propranolol  daily  6. Depressed mood, followed by psychiatry  - Lengthy discussion regarding importance of exploring new hobbies that he enjoys  - Anger outburst appear to immature defense mechanism and I encouraged him to see a counselor to help with coping mechanisms  Return to clinic in 4 months   The duration of this appointment visit was 45 minutes of face-to-face time with the patient.  Greater than 50% of this time was spent in counseling, explanation of diagnosis, planning of further management, and coordination of care.   Thank you for allowing me to participate in patient's care.  If I can answer any additional questions, I would be pleased to do so.    Sincerely,    Donika K. Allena Katz, DO

## 2015-09-23 NOTE — Patient Instructions (Addendum)
1.  Check vitamin B12 for fatigue 2.  Routine EEG 3.  Stop ibuprofen 4.  Recommend seeing a counselor for coping mechanism  Return to clinic in 4 months

## 2015-09-24 ENCOUNTER — Ambulatory Visit: Payer: Self-pay | Admitting: Neurology

## 2015-09-29 ENCOUNTER — Ambulatory Visit (HOSPITAL_COMMUNITY): Payer: Self-pay | Admitting: Psychiatry

## 2015-10-08 ENCOUNTER — Other Ambulatory Visit: Payer: Self-pay

## 2015-10-14 MED FILL — OMEPRAZOLE DR 40 MG CAPSULE: 40 | 30 days supply | Qty: 60 | Fill #1

## 2015-10-14 MED FILL — lamoTRIgine 100 MG TABS: 100 | 30 days supply | Qty: 30 | Fill #1 | Status: TO

## 2015-10-14 MED FILL — PROPRANOLOL ER 80 MG CAP: 80 | 30 days supply | Qty: 30 | Fill #3

## 2015-10-14 MED FILL — PARoxetine HCL 30 MG TABS: 30 | 30 days supply | Qty: 30 | Fill #2

## 2015-10-15 MED FILL — risperiDONE 1 MG TABS: 1 | 30 days supply | Qty: 60 | Fill #1 | Status: TO

## 2015-10-16 MED FILL — ARMODAFINIL 150 MG TABLET: 150 | 30 days supply | Qty: 45 | Fill #4

## 2015-10-19 ENCOUNTER — Ambulatory Visit: Payer: Self-pay | Admitting: Gastroenterology

## 2015-10-20 ENCOUNTER — Ambulatory Visit (INDEPENDENT_AMBULATORY_CARE_PROVIDER_SITE_OTHER): Payer: 59 | Admitting: Adult Health

## 2015-10-20 ENCOUNTER — Encounter: Payer: Self-pay | Admitting: Adult Health

## 2015-10-20 VITALS — BP 102/70 | HR 68 | Temp 98.0°F | Ht 76.0 in | Wt 252.0 lb

## 2015-10-20 DIAGNOSIS — G4710 Hypersomnia, unspecified: Secondary | ICD-10-CM | POA: Diagnosis not present

## 2015-10-20 MED ORDER — AMPHETAMINE-DEXTROAMPHET ER 20 MG PO CP24: 20.0000 mg | ORAL_CAPSULE | Freq: Every day | ORAL | Status: DC

## 2015-10-20 NOTE — Patient Instructions (Addendum)
Continue on Adderall daily .  You can pick up prescription monthly at front desk .  follow up Dr. Craige CottaSood  In 4-6 months and As needed

## 2015-10-20 NOTE — Progress Notes (Signed)
Subjective:    Patient ID: Timothy [name], male    DOB: 11-21-1970, 45 y.o.   MRN: [id number]  HPI 45 yo male with hypersomnia   TEST  PSG 01/30/15 >> AHI 1.1, SpO2 low 90%, UARS. MSLT 05/26/15 >> 5/5 naps, no SOREMs, mean sleep latency 3 min 48 sec.  10/20/2015 Follow up : Hypersomnia  Pt returns for a 1 month follow up  For daytime hypersomnia.  Pt had a MSLT in Dec 2016 that showed sleep latency of 3 min 48 sec, no SOREMs c/w pathologic sleepiness. He was previously on [place] but [employer] no longer covered nuvigil . He was changed to Adderall b/c insurance will cover this. He feels much better on Adderall-feels it works better.  Goes to bed around 11pm , gets up around 7am . Sleeps most of the night ,gets up with 45 yr old on occasion.  Does have some daytime sleepiness but this is improved. Advised on healthy sleep regimen  Allowing for daytime naps if able . No driving if sleepy.  Denies chest pain , orthopnea, edema or fever, mood changes, or suicidal ideations or weight changes.    Past Medical History  Diagnosis Date  . Helicobacter pylori gastritis 06/30/06  . GERD (gastroesophageal reflux disease)   . Chronic headache   . Carpal tunnel syndrome on both sides 03/28/2013  . 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 TIA (transient ischemic attack)    . Current Outpatient Prescriptions on File Prior to Visit  Medication Sig Dispense Refill  . amphetamine-dextroamphetamine (ADDERALL XR) 20 MG 24 hr capsule Take 1 capsule (20 mg total) by mouth daily. 30 capsule 0  . lamoTRIgine (LAMICTAL) 100 MG tablet Take 1 tablet (100 mg total) by mouth daily. 30 tablet 2  . omeprazole (PRILOSEC) 40 MG capsule Take 1 capsule (40 mg total) by mouth 2 (two) times daily. 60 capsule 1  . PARoxetine (PAXIL) 30 MG tablet Take 1 tablet (30 mg total) by mouth daily. 30 tablet 2  . propranolol ER (INDERAL LA) 80 MG 24 hr capsule Take 1 capsule (80 mg total) by mouth daily. 30 capsule 5  . RELPAX 40  MG tablet   3  . risperiDONE (RISPERDAL) 1 MG tablet Take 1 tablet (1 mg total) by mouth 2 (two) times daily. 60 tablet 2  . Armodafinil 150 MG tablet Take 1 tablet (150 mg total) by mouth daily. (Patient not taking: Reported on 10/20/2015) 30 tablet 0   No current facility-administered medications on file prior to visit.       Review of Systems Constitutional:   No  weight loss, night sweats,  Fevers, chills, + fatigue, or  lassitude.  HEENT:   No headaches,  Difficulty swallowing,  Tooth/dental problems, or  Sore throat,                No sneezing, itching, ear ache, nasal congestion, post nasal drip,   CV:  No chest pain,  Orthopnea, PND, swelling in lower extremities, anasarca, dizziness, palpitations, syncope.   GI  No heartburn, indigestion, abdominal pain, nausea, vomiting, diarrhea, change in bowel habits, loss of appetite, bloody stools.   Resp: No shortness of breath with exertion or at rest.  No excess mucus, no productive cough,  No non-productive cough,  No coughing up of blood.  No change in color of mucus.  No wheezing.  No chest wall deformity  Skin: no rash or lesions.  GU: no dysuria, change in color of urine, no  urgency or frequency.  No flank pain, no hematuria   MS:  No joint pain or swelling.  No decreased range of motion.  No back pain.  Psych:  No change in mood or affect. No depression or anxiety.  No memory loss.         Objective:   Physical Exam Filed Vitals:   10/20/15 0908  BP: 102/70  Pulse: 68  Temp: 98 F (36.7 C)  TempSrc: Oral  Height: 6\' 4"  (1.93 m)  Weight: 252 lb (114.306 kg)  SpO2: 96%  Body mass index is 30.69 kg/(m^2).  GEN: A/Ox3; pleasant , NAD,  HEENT:  [place]/AT,  EACs-clear, TMs-wnl, NOSE-clear, THROAT-clear, no lesions, no postnasal drip or exudate noted.   NECK:  Supple w/ fair ROM; no JVD; normal carotid impulses w/o bruits; no thyromegaly or nodules palpated; no lymphadenopathy.  RESP  Clear  P & A; w/o, wheezes/  rales/ or rhonchi.no accessory muscle use, no dullness to percussion  CARD:  RRR, no m/r/g  , no peripheral edema, pulses intact, no cyanosis or clubbing.  GI:   Soft & nt; nml bowel sounds; no organomegaly or masses detected.  Musco: Warm bil, no deformities or joint swelling noted.   Neuro: alert, no focal deficits noted.    Skin: Warm, no lesions or rashes  Tammy Parrett NP-C  [facility]  10/20/2015        Assessment & Plan:

## 2015-10-20 NOTE — Assessment & Plan Note (Signed)
Improved control on Adderall .  Pt education on Adderall.  Advised on healthy sleep regimen and allow for daily naps if able.  No driving if sleepy  Plan  Continue on Adderall daily .  You can pick up prescription monthly at front desk .  follow up Dr. Craige CottaSood  In 4-6 months and As needed

## 2015-10-20 NOTE — Progress Notes (Signed)
Reviewed, and agree with assessment/plan.  Coralyn HellingVineet Jawaun Celmer, MD [facility] 10/20/2015, 10:59 AM Pager:  [phone]

## 2015-10-20 NOTE — Addendum Note (Signed)
Addended by: Karalee HeightOX, Genette Huertas P on: 10/20/2015 02:13 PM   Modules accepted: Orders, Medications

## 2015-11-05 ENCOUNTER — Encounter: Payer: Self-pay | Admitting: Neurology

## 2015-11-05 ENCOUNTER — Other Ambulatory Visit: Payer: Self-pay | Admitting: *Deleted

## 2015-11-05 MED ORDER — RELPAX 40 MG PO TABS: 40.0000 mg | ORAL_TABLET | ORAL | Status: DC | PRN

## 2015-11-05 MED ORDER — PROPRANOLOL HCL ER 80 MG PO CP24: 80.0000 mg | ORAL_CAPSULE | Freq: Every day | ORAL | Status: DC

## 2015-11-18 ENCOUNTER — Telehealth: Payer: Self-pay | Admitting: Pulmonary Disease

## 2015-11-18 NOTE — Telephone Encounter (Signed)
lmtcb X1 for pt  

## 2015-11-19 NOTE — Telephone Encounter (Signed)
lmtcb x2 for pt. 

## 2015-11-20 MED ORDER — AMPHETAMINE-DEXTROAMPHET ER 20 MG PO CP24: 20.0000 mg | ORAL_CAPSULE | Freq: Every day | ORAL | Status: DC

## 2015-11-20 NOTE — Telephone Encounter (Signed)
Patient came into office to pick up RX.   Rx printed and signed by Dr. Maple HudsonYoung in Dr. Evlyn CourierSood's absence. Patient's next OV 02/29/16 Nothing further needed.

## 2015-11-20 NOTE — Telephone Encounter (Signed)
IN LOBBY to pick up rx

## 2015-11-20 NOTE — Telephone Encounter (Signed)
lmtcb x3 for pt. 

## 2015-11-25 ENCOUNTER — Encounter (HOSPITAL_COMMUNITY): Payer: Self-pay | Admitting: Psychiatry

## 2015-11-25 ENCOUNTER — Ambulatory Visit (INDEPENDENT_AMBULATORY_CARE_PROVIDER_SITE_OTHER): Payer: 59 | Admitting: Psychiatry

## 2015-11-25 VITALS — BP 118/74 | HR 80 | Ht 76.0 in | Wt 259.6 lb

## 2015-11-25 DIAGNOSIS — F3132 Bipolar disorder, current episode depressed, moderate: Secondary | ICD-10-CM

## 2015-11-25 MED ORDER — CITALOPRAM HYDROBROMIDE 20 MG PO TABS: 20.0000 mg | ORAL_TABLET | Freq: Every day | ORAL | Status: DC

## 2015-11-25 MED ORDER — RISPERIDONE 2 MG PO TABS: 2.0000 mg | ORAL_TABLET | Freq: Every day | ORAL | Status: DC

## 2015-11-25 MED ORDER — LAMOTRIGINE 150 MG PO TABS: 150.0000 mg | ORAL_TABLET | Freq: Every day | ORAL | Status: DC

## 2015-11-25 NOTE — Progress Notes (Signed)
[facility] [id number] Progress Note   Patient Identification: Timothy [name] MRN:  [id number] Date of service:  11/25/2015   Chief Complaint:   I still have a lot of mood swings irritability and agitation.  History of present illness Timothy [name] is 45 year old Caucasian, employed, married man who has been seen in this office by Dr. Jarold [name] who left the practice .  Patient getting the diagnoses of bipolar disorder and major depressive disorder.  He is taking Paxil, Lamictal, Risperdal and recently his sleep physician added Adderall to help his narcolepsy.  On the last visit his psychiatrist increased the Lamictal as patient continues to have episodic agitation and anger.  Patient feels some improvement with Lamictal but he still have mood swing, irritability and having arguments with the wife.  He likes Adderall which is helping his narcolepsy and he does not sleep as much during the day but he gets easily tired .  He travels at least 100 miles a day for his job.  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  He is back living with his wife but admitted things are not going very well.  His wife threatened him that she may leave him but staying because of 2 kids who are only 3 and 51 years old.  Patient admitted that he has a chronic issues controlling his anger and hoping that medicine will help.  He also endorse lately use heroin and he regretted about that.  He also went to a local hospital at [facility] to get detox but soon after he discharged he relapsed again.  He does not use every day and usually use once or twice a month but he is scared to use more.  He does not feel Paxil helping him.  His brother takes Celexa which helped him a lot.  He like to try Celexa.  Patient denies any paranoia, hallucination, self abusive behavior .  He admitted episodes of depression and irritability but denies any active or passive suicidal thoughts or homicidal thought.  He lives with his  wife and 2 kids.  He has no tremors, shakes, EPS.  His vital signs are stable.  Past Psychiatric History; Patient denies any previous history of psychiatric inpatient treatment, suicidal attempt or any paranoia.  He admitted history of mood swing, anger, irritability and depression.  He has seen Timothy [name] in this office who prescribes Celexa which helped him but he stopped after 6 months and then his primary care physician at [facility], they started him on Paxil.   Past Medical History: Essential tremor, migraine, GERD, history of TIA. Problems with hearing, Mosaic Klinefelter syndrome. his primary care physician is Dr. Linus [name] in [place].  He Also See Dr. Elgie [name] for narcolepsy.   Past Medical History  Diagnosis Date  . Helicobacter pylori gastritis 06/30/06  . GERD (gastroesophageal reflux disease)   . Chronic headache   . Carpal tunnel syndrome on both sides 03/28/2013  . 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 TIA (transient ischemic attack)     Past Surgical History  Procedure Laterality Date  . Unremarkable     Family History: Brother and sister have depression,sister,both sets of grandparenats, 2 first cousins, and Paternal uncle have bipolar.  Family History  Problem Relation Age of Onset  . Heart disease    . Barrett's esophagus Mother   . Breast cancer Mother   . Diabetes Paternal Grandfather   . Heart disease Paternal Grandfather   .  Irritable bowel syndrome Brother   . Allergies Brother   . Heart disease Paternal Grandmother   . Colon cancer Neg Hx   . Esophageal cancer Neg Hx    Social History:  Patient lives with his wife and 2 children.  Social History   Social History  . Marital Status: Married    Spouse Name: N/A  . Number of Children: 1  . Years of Education: N/A   Occupational History  . Full time student    Social History Main Topics  . Smoking status: Never Smoker   . Smokeless tobacco: Never Used  . Alcohol Use: No  . Drug Use: No  .  Sexual Activity:    Partners: Female   Other Topics Concern  . None   Social History Narrative   Lives with wife and children in a one story home.  Has 2 children.     Works in Engineering geologist.     Education: college.     Musculoskeletal: Strength & Muscle Tone: within normal limits Gait & Station: normal, Normal Patient leans: Stands straight  Psychiatric Specialty Exam: Anxiety Symptoms include nervous/anxious behavior. Patient reports no insomnia.      Review of Systems  Respiratory: Negative.   Cardiovascular: Negative.   Gastrointestinal: Negative.   Musculoskeletal: Negative.   Skin: Negative for itching and rash.  Endo/Heme/Allergies: Negative for environmental allergies and polydipsia. Does not bruise/bleed easily.  Psychiatric/Behavioral: Positive for depression. The patient is nervous/anxious. The patient does not have insomnia.     Blood pressure 118/74, pulse 80, height  (1.93 m), weight 259 lb 9.6 oz (117.754 kg).Body mass index is 31.61 kg/(m^2).  General Appearance: Casual  Eye Contact:  Good  Speech:  Clear and Coherent and Normal Rate  Volume:  Normal  Mood:  Anxious   Affect:  Appropriate   Thought Process:  Goal Directed, Linear and Logical  Orientation:  Full (Time, Place, and Person)  Thought Content:  WDL   Suicidal Thoughts:  No  Homicidal Thoughts:  No  Memory:  Within normal limit   Judgement:  Good  Insight:  Good  Psychomotor Activity:  Normal  Concentration:  I good   Recall:  Good   Fund of Knowledge:Fair  Language: Good  Akathisia:  No  Handed:  Right  AIMS (if indicated):  0  Assets:  Communication Skills Desire for Improvement Physical Health Resilience Social Support Transportation  ADL's:  Intact  Cognition: WNL  Sleep:  =5hrs   Allergies:   Allergies  Allergen Reactions  . Ioxaglate   . Ivp Dye [Iodinated Diagnostic Agents]    Current Medications: Current Outpatient Prescriptions  Medication Sig Dispense Refill   . amphetamine-dextroamphetamine (ADDERALL XR) 20 MG 24 hr capsule Take 1 capsule (20 mg total) by mouth daily. 30 capsule 0  . lamoTRIgine (LAMICTAL) 150 MG tablet Take 1 tablet (150 mg total) by mouth daily. 30 tablet 1  . omeprazole (PRILOSEC) 40 MG capsule Take 1 capsule (40 mg total) by mouth 2 (two) times daily. 60 capsule 1  . propranolol ER (INDERAL LA) 80 MG 24 hr capsule Take 1 capsule (80 mg total) by mouth daily. 30 capsule 5  . RELPAX 40 MG tablet Take 1 tablet (40 mg total) by mouth every 2 (two) hours as needed for migraine or headache. 10 tablet 3  . risperiDONE (RISPERDAL) 2 MG tablet Take 1 tablet (2 mg total) by mouth at bedtime. 30 tablet 1  . citalopram (CELEXA) 20 MG tablet Take  1 tablet (20 mg total) by mouth daily. 30 tablet 1   No current facility-administered medications for this visit.    New problem, with additional work up planned, Review of Psycho-Social Stressors (1), Decision to obtain old records (1), Review and summation of old records (2), New Problem, with no additional work-up planned (3), Review of Last Therapy Session (1), Review of Medication Regimen & Side Effects (2) and Review of New Medication or Change in Dosage (2)  Assessment;  Axis I; bipolar disorder, depressed type.  Rule out major depressive disorder recurrent.  Opiate abuse  Axis II; deferred  Treatment Plan: I reviewed records, psychosocial stressors, current medication, collateral information from other providers and current medication.  He was recently started Adderall by his pulmonologist for narcolepsy and he is feeling better.  However he continues to have irritability and anger.  I recommended to increase Lamictal 150 mg to help the mood swings and bipolar disorder.  Patient like to try Celexa which help in the past and also help his brother.  I will discontinue Paxil and try Celexa 20 mg daily.  We also talked in length about his opiate use and patient promised that he will not use any  more in the future however if he continued to have bizarre and have difficulty quitting then he will contact us so we can help him.  I also suggested to see a therapist for coping and social skills.  We will schedule appointment with a therapist in this office.  Follow-up in 2 months.  Discussed medication side effects but he rash in that case he needed to stop the medication in Lamictal immediately.  Discuss safety plan that anytime having active suicidal thoughts or homicidal thoughts then he to call 911 or go to the local emergency room.  Tinisha Etzkorn T. 6/21/20173:50 PM

## 2015-11-30 ENCOUNTER — Telehealth (HOSPITAL_COMMUNITY): Payer: Self-pay | Admitting: Licensed Clinical Social Worker

## 2015-12-02 ENCOUNTER — Telehealth (HOSPITAL_COMMUNITY): Payer: Self-pay

## 2015-12-02 ENCOUNTER — Ambulatory Visit (HOSPITAL_COMMUNITY): Payer: Self-pay | Admitting: Licensed Clinical Social Worker

## 2015-12-02 DIAGNOSIS — F3132 Bipolar disorder, current episode depressed, moderate: Secondary | ICD-10-CM

## 2015-12-02 MED ORDER — PAROXETINE HCL 30 MG PO TABS: 30.0000 mg | ORAL_TABLET | Freq: Every day | ORAL | Status: DC

## 2015-12-02 NOTE — Telephone Encounter (Signed)
Patients wife called, there is no HIPAA form signed for her, when I told her that I could not discuss any information with her she became furious, she yelled at me saying that she has been to his appointments and that she has POA of his healthcare. She is upset that Dr. Lolly MustacheArfeen took him off Paxil and put him on Celexa, she insisted that we put him back on Paxil. I told her that if her husband would like to call me and give me verbal permission, then I would be happy to speak to her. I s/w her husband earlier today and had already sent Dr. Lolly MustacheArfeen a message regarding this.

## 2015-12-02 NOTE — Telephone Encounter (Signed)
Patient called because at his last visit on 6/21 his Paxil was d/c'd and he was started on Celebrex. Patient states that he tried and failed the Celexa back in 2015 when he first started coming. He was requesting to go back to the Paxil, but did note that it does not work that well either. Patient would like to know if there is something else he can try. Please review and advise, thank you

## 2015-12-02 NOTE — Telephone Encounter (Signed)
Patients wife called, Dr. Lolly MustacheArfeen spoke to her, she was very upset about the medication change and seemed to blame Dr. Lolly MustacheArfeen. She was also upset that I had told her that I could not give her any information. After their brief conversation Dr. Lolly MustacheArfeen gave the okay to go back to Paxil 30 mg 1 po qd. I sent this to the pharmacy, called them and told them to d/c the Celexa. I called patient back and left a voicemail letting him know that we had made the change and that he would need to sign a HIPAA form next time he comes in and bring us a copy of his wife's Medical POA.

## 2015-12-04 ENCOUNTER — Telehealth (HOSPITAL_COMMUNITY): Payer: Self-pay

## 2015-12-04 NOTE — Telephone Encounter (Signed)
I received a call from [place] at [facility], she wanted you to be aware that patient is actively using heroine. I told her I would pass along the information.

## 2015-12-06 ENCOUNTER — Emergency Department (HOSPITAL_COMMUNITY)
Admission: EM | Admit: 2015-12-06 | Discharge: 2015-12-06 | Disposition: A | Discharge status: Home / Self Care | Payer: 59 | Attending: Emergency Medicine | Admitting: Emergency Medicine

## 2015-12-06 ENCOUNTER — Encounter (HOSPITAL_COMMUNITY): Payer: Self-pay | Admitting: Emergency Medicine

## 2015-12-06 DIAGNOSIS — F411 Generalized anxiety disorder: Secondary | ICD-10-CM | POA: Diagnosis not present

## 2015-12-06 DIAGNOSIS — F3130 Bipolar disorder, current episode depressed, mild or moderate severity, unspecified: Secondary | ICD-10-CM | POA: Diagnosis present

## 2015-12-06 DIAGNOSIS — F1120 Opioid dependence, uncomplicated: Secondary | ICD-10-CM | POA: Insufficient documentation

## 2015-12-06 DIAGNOSIS — Z8673 Personal history of transient ischemic attack (TIA), and cerebral infarction without residual deficits: Secondary | ICD-10-CM | POA: Diagnosis not present

## 2015-12-06 DIAGNOSIS — Z79899 Other long term (current) drug therapy: Secondary | ICD-10-CM | POA: Diagnosis not present

## 2015-12-06 DIAGNOSIS — F1190 Opioid use, unspecified, uncomplicated: Secondary | ICD-10-CM | POA: Diagnosis not present

## 2015-12-06 DIAGNOSIS — F1123 Opioid dependence with withdrawal: Secondary | ICD-10-CM | POA: Insufficient documentation

## 2015-12-06 DIAGNOSIS — Z0000 Encounter for general adult medical examination without abnormal findings: Secondary | ICD-10-CM | POA: Diagnosis present

## 2015-12-06 LAB — CBC WITH DIFFERENTIAL/PLATELET
Basophils Absolute: 0 10*3/uL (ref 0.0–0.1)
Basophils Relative: 0 %
EOS PCT: 2 %
Eosinophils Absolute: 0.1 10*3/uL (ref 0.0–0.7)
HCT: 37.8 % — ABNORMAL LOW (ref 39.0–52.0)
HEMOGLOBIN: 12.9 g/dL — AB (ref 13.0–17.0)
LYMPHS ABS: 1.8 10*3/uL (ref 0.7–4.0)
LYMPHS PCT: 27 %
MCH: 32 pg (ref 26.0–34.0)
MCHC: 34.1 g/dL (ref 30.0–36.0)
MCV: 93.8 fL (ref 78.0–100.0)
MONOS PCT: 10 %
Monocytes Absolute: 0.6 10*3/uL (ref 0.1–1.0)
Neutro Abs: 4 10*3/uL (ref 1.7–7.7)
Neutrophils Relative %: 61 %
PLATELETS: 160 10*3/uL (ref 150–400)
RBC: 4.03 MIL/uL — AB (ref 4.22–5.81)
RDW: 14.2 % (ref 11.5–15.5)
WBC: 6.7 10*3/uL (ref 4.0–10.5)

## 2015-12-06 LAB — RAPID URINE DRUG SCREEN, HOSP PERFORMED
Amphetamines: NOT DETECTED
Barbiturates: NOT DETECTED
Benzodiazepines: NOT DETECTED
Cocaine: NOT DETECTED
OPIATES: NOT DETECTED
Tetrahydrocannabinol: NOT DETECTED

## 2015-12-06 LAB — COMPREHENSIVE METABOLIC PANEL
ALK PHOS: 72 U/L (ref 38–126)
ALT: 23 U/L (ref 17–63)
AST: 26 U/L (ref 15–41)
Albumin: 4.1 g/dL (ref 3.5–5.0)
Anion gap: 6 (ref 5–15)
BUN: 14 mg/dL (ref 6–20)
CALCIUM: 8.9 mg/dL (ref 8.9–10.3)
CO2: 24 mmol/L (ref 22–32)
CREATININE: 0.96 mg/dL (ref 0.61–1.24)
Chloride: 111 mmol/L (ref 101–111)
Glucose, Bld: 96 mg/dL (ref 65–99)
Potassium: 4.1 mmol/L (ref 3.5–5.1)
Sodium: 141 mmol/L (ref 135–145)
Total Bilirubin: 0.3 mg/dL (ref 0.3–1.2)
Total Protein: 7 g/dL (ref 6.5–8.1)

## 2015-12-06 LAB — ETHANOL

## 2015-12-06 MED ORDER — RIVAROXABAN 15 MG PO TABS
15.0000 mg | ORAL_TABLET | Freq: Two times a day (BID) | ORAL | Status: DC
  Administered 2015-12-06: 15 mg via ORAL
  Filled 2015-12-06: qty 1

## 2015-12-06 MED ORDER — LAMOTRIGINE 150 MG PO TABS
150.0000 mg | ORAL_TABLET | Freq: Every day | ORAL | Status: DC
  Administered 2015-12-06: 150 mg via ORAL
  Filled 2015-12-06: qty 1

## 2015-12-06 MED ORDER — PANTOPRAZOLE SODIUM 40 MG PO TBEC
80.0000 mg | DELAYED_RELEASE_TABLET | Freq: Every day | ORAL | Status: DC
  Administered 2015-12-06: 80 mg via ORAL
  Filled 2015-12-06: qty 2

## 2015-12-06 MED ORDER — ONDANSETRON 4 MG PO TBDP: 4.0000 mg | ORAL_TABLET | Freq: Three times a day (TID) | ORAL | Status: DC | PRN

## 2015-12-06 MED ORDER — HYDROCODONE-ACETAMINOPHEN 5-325 MG PO TABS: 1.0000 | ORAL_TABLET | Freq: Three times a day (TID) | ORAL | Status: DC | PRN

## 2015-12-06 MED ORDER — PAROXETINE HCL 20 MG PO TABS
30.0000 mg | ORAL_TABLET | Freq: Every day | ORAL | Status: DC
  Administered 2015-12-06: 30 mg via ORAL
  Filled 2015-12-06: qty 1.5

## 2015-12-06 MED ORDER — RISPERIDONE 2 MG PO TABS: 2.0000 mg | ORAL_TABLET | Freq: Every day | ORAL | Status: DC

## 2015-12-06 MED ORDER — PROPRANOLOL HCL ER 80 MG PO CP24
80.0000 mg | ORAL_CAPSULE | Freq: Every day | ORAL | Status: DC
  Administered 2015-12-06: 80 mg via ORAL
  Filled 2015-12-06: qty 1

## 2015-12-06 MED ORDER — AMPHETAMINE-DEXTROAMPHET ER 10 MG PO CP24
20.0000 mg | ORAL_CAPSULE | Freq: Every day | ORAL | Status: DC
  Administered 2015-12-06: 20 mg via ORAL
  Filled 2015-12-06: qty 2

## 2015-12-06 NOTE — BHH Suicide Risk Assessment (Cosign Needed)
Suicide Risk Assessment  Discharge Assessment   [facility]   Principal Problem: Bipolar affect, depressed [facility](HCC) Discharge Diagnoses:  Patient Active Problem List   Diagnosis Date Noted  . Bipolar affect, depressed (HCC) [F31.30] 12/06/2015  . Recurrent vertigo [R42] 02/12/2015  . Generalized anxiety disorder [F41.1] 12/31/2014  . Major depression, chronic (HCC) [F32.9] 12/31/2014  . Hypersomnolence disorder, acute, moderate [G47.10] 12/31/2014  . Migraine without aura and without status migrainosus, not intractable [G43.009] 10/15/2014  . Essential tremor [G25.0] 10/15/2014  . Upper airway resistance syndrome [G47.8] 09/22/2014  . Carpal tunnel syndrome on both sides [G56.03] 03/28/2013  . 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 Pleuritic chest pain [R07.81] 11/22/2011  . Cough [R05] 11/22/2011  . Hypersomnia [G47.10] 11/05/2010    Total Time spent with patient: 30 minutes  Musculoskeletal: Strength & Muscle Tone: within normal limits Gait & Station: normal Patient leans: N/A  Psychiatric Specialty Exam:   Blood pressure 136/87, pulse 99, temperature 97.1 F (36.2 C), temperature source Oral, resp. rate 20, SpO2 98 %.There is no weight on file to calculate BMI.   General Appearance: Casual  Eye Contact: Good  Speech: Clear and Coherent and Normal Rate  Volume: Normal  Mood: Depressed  Affect: Congruent  Thought Process: Coherent and Goal Directed  Orientation: Full (Time, Place, and Person)  Thought Content: Logical  Suicidal Thoughts: No  Homicidal Thoughts: No  Memory: Immediate; Good Recent; Good Remote; Good  Judgement: Fair  Insight: Present  Psychomotor Activity: Normal  Concentration: Concentration: Fair and Attention Span: Fair  Recall: Good  Fund of Knowledge: Good  Language: Good  Akathisia: No  Handed: Right  AIMS (if indicated):    Assets: Communication  Skills Desire for Improvement Housing Social Support  ADL's: Intact  Cognition: WNL  Sleep:           Mental Status Per Nursing Assessment::   On Admission:     Demographic Factors:  Male and Caucasian  Loss Factors: Legal issues  Historical Factors: Impulsivity  Risk Reduction Factors:   Living with another person, especially a relative, Positive social support and Positive therapeutic relationship  Continued Clinical Symptoms:  Alcohol/Substance Abuse/Dependencies  Cognitive Features That Contribute To Risk:  Closed-mindedness    Suicide Risk:  Minimal: No identifiable suicidal ideation.  Patients presenting with no risk factors but with morbid ruminations; may be classified as minimal risk based on the severity of the depressive symptoms  Follow-up Information    Follow up with Dr. Jory EeArfreen and substance abuse. Schedule an appointment as soon as possible for a visit today.   Why:  For alcohol treatment      Plan Of Care/Follow-up recommendations:  Activity:  As tolerated Diet:  Low sodium  Rankin, Shuvon, NP 12/06/2015, 1:42 PM

## 2015-12-06 NOTE — BH Assessment (Signed)
Assessment completed. Consulted Charles Kober,PA-C who recommended inpatient treatment. TTS to seek placement. Informed Kelly Humes,PA-C of the recommendation.  

## 2015-12-06 NOTE — ED Notes (Signed)
Pt's family took his belongings home per his request
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
## 2015-12-06 NOTE — Consult Note (Signed)
[facility] Consult   Reason for Consult:  Heroin abuse requesting detox Referring Physician:  EDP Patient Identification: [facility] DETERDING MRN:  [id number] Principal Diagnosis: Bipolar affect, depressed ([place]) Diagnosis:   Patient Active Problem List   Diagnosis Date Noted  . Bipolar affect, depressed ([place]) [F31.30] 12/06/2015  . Recurrent vertigo [R42] 02/12/2015  . Generalized anxiety disorder [F41.1] 12/31/2014  . Major depression, chronic ([place]) [F32.9] 12/31/2014  . Hypersomnolence disorder, acute, moderate [G47.10] 12/31/2014  . Migraine without aura and without status migrainosus, not intractable [G43.009] 10/15/2014  . Essential tremor [G25.0] 10/15/2014  . Upper airway resistance syndrome [G47.8] 09/22/2014  . Carpal tunnel syndrome on both sides [G56.03] 03/28/2013  . 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 Pleuritic chest pain [R07.81] 11/22/2011  . Cough [R05] 11/22/2011  . Hypersomnia [G47.10] 11/05/2010    Total Time spent with patient: 30 minutes  Subjective:   Timothy [name] is a 45 y.o. male patient presented to [facility] requesting Heroin detox  HPI:  Timothy [name] 45 y.o. male patient seen by this provider and chart reviewed 12/06/2015.  On evaluation:  [name] reports that he and his wife got into an argument yesterday; after argument he left to do heroin to help me relax.  Reports that he doesn't do heroin daily and his last use was 3 days ago.  Denies any other drug use. Patient also denies suicidal/homicidal ideation, psychosis, and paranoia.  Patient states that he has outpatient services with [facility] (Dr. Adele Schilder)     Past Psychiatric History: Depression  Risk to Self: Suicidal Ideation: No Suicidal Intent: No Is patient at risk for suicide?: No Suicidal Plan?: No Access to Means: No What has been your use of drugs/alcohol within the last 12 months?: Heroin use reported.  How many times?: 0 Other Self Harm Risks: Pt  denies  Triggers for Past Attempts: None known (No previous attempts reported. ) Intentional Self Injurious Behavior: None Risk to Others: Homicidal Ideation: No Thoughts of Harm to Others: No Current Homicidal Intent: No Current Homicidal Plan: No Access to Homicidal Means: No Identified Victim: N/A History of harm to others?: No Assessment of Violence: None Noted Violent Behavior Description: No violent behaviors reported.  Does patient have access to weapons?: No Criminal Charges Pending?: No Does patient have a court date: No Prior Inpatient Therapy: Prior Inpatient Therapy: No Prior Outpatient Therapy: Prior Outpatient Therapy: Yes Prior Therapy Dates: Current Prior Therapy Facilty/Provider(s): [facility]  Reason for Treatment: Medication Management  Does patient have an ACCT team?: No Does patient have Intensive In-House Services?  : No Does patient have [facility] services? : No Does patient have P4CC services?: No  Past Medical History:  Past Medical History  Diagnosis Date  . Helicobacter pylori gastritis 06/30/06  . GERD (gastroesophageal reflux disease)   . Chronic headache   . Carpal tunnel syndrome on both sides 03/28/2013  . 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 TIA (transient ischemic attack)     Past Surgical History  Procedure Laterality Date  . Unremarkable     Family History:  Family History  Problem Relation Age of Onset  . Heart disease    . Barrett's esophagus Mother   . Breast cancer Mother   . Diabetes Paternal Grandfather   . Heart disease Paternal Grandfather   . Irritable bowel syndrome Brother   . Allergies Brother   . Heart disease Paternal Grandmother   . Colon cancer Neg Hx   .  Esophageal cancer Neg Hx    Family Psychiatric  History: Denies Social History:  History  Alcohol Use No     History  Drug Use No    Social History   Social History  . Marital Status: Married    Spouse Name: N/A  . Number of Children: 1  .  Years of Education: N/A   Occupational History  . Full time student    Social History Main Topics  . Smoking status: Never Smoker   . Smokeless tobacco: Never Used  . Alcohol Use: No  . Drug Use: No  . Sexual Activity:    Partners: Female   Other Topics Concern  . None   Social History Narrative   Lives with wife and children in a one story home.  Has 2 children.     Works in Scientist, research (medical).     Education: college.   Additional Social History:    Allergies:   Allergies  Allergen Reactions  . Ioxaglate Shortness Of Breath and Other (See Comments)    dizziness  . Ivp Dye [Iodinated Diagnostic Agents] Shortness Of Breath and Other (See Comments)    dizziness    Labs:  Results for orders placed or performed during the hospital encounter of 12/06/15 (from the past 48 hour(s))  CBC with Differential     Status: Abnormal   Collection Time: 12/06/15  1:23 AM  Result Value Ref Range   WBC 6.7 4.0 - 10.5 K/uL   RBC 4.03 (L) 4.22 - 5.81 MIL/uL   Hemoglobin 12.9 (L) 13.0 - 17.0 g/dL   HCT 37.8 (L) 39.0 - 52.0 %   MCV 93.8 78.0 - 100.0 fL   MCH 32.0 26.0 - 34.0 pg   MCHC 34.1 30.0 - 36.0 g/dL   RDW 14.2 11.5 - 15.5 %   Platelets 160 150 - 400 K/uL   Neutrophils Relative % 61 %   Neutro Abs 4.0 1.7 - 7.7 K/uL   Lymphocytes Relative 27 %   Lymphs Abs 1.8 0.7 - 4.0 K/uL   Monocytes Relative 10 %   Monocytes Absolute 0.6 0.1 - 1.0 K/uL   Eosinophils Relative 2 %   Eosinophils Absolute 0.1 0.0 - 0.7 K/uL   Basophils Relative 0 %   Basophils Absolute 0.0 0.0 - 0.1 K/uL  Comprehensive metabolic panel     Status: None   Collection Time: 12/06/15  1:23 AM  Result Value Ref Range   Sodium 141 135 - 145 mmol/L   Potassium 4.1 3.5 - 5.1 mmol/L   Chloride 111 101 - 111 mmol/L   CO2 24 22 - 32 mmol/L   Glucose, Bld 96 65 - 99 mg/dL   BUN 14 6 - 20 mg/dL   Creatinine, Ser 0.96 0.61 - 1.24 mg/dL   Calcium 8.9 8.9 - 10.3 mg/dL   Total Protein 7.0 6.5 - 8.1 g/dL   Albumin 4.1 3.5 - 5.0  g/dL   AST 26 15 - 41 U/L   ALT 23 17 - 63 U/L   Alkaline Phosphatase 72 38 - 126 U/L   Total Bilirubin 0.3 0.3 - 1.2 mg/dL   GFR calc non Af Amer >60 >60 mL/min   GFR calc Af Amer >60 >60 mL/min    Comment: (NOTE) The eGFR has been calculated using the CKD EPI equation. This calculation has not been validated in all clinical situations. eGFR's persistently <60 mL/min signify possible Chronic Kidney Disease.    Anion gap 6 5 - 15  Ethanol  Status: None   Collection Time: 12/06/15  1:24 AM  Result Value Ref Range   Alcohol, Ethyl (B) <5 <5 mg/dL    Comment:        LOWEST DETECTABLE LIMIT FOR SERUM ALCOHOL IS 5 mg/dL FOR MEDICAL PURPOSES ONLY   Rapid urine drug screen (hospital performed)     Status: None   Collection Time: 12/06/15  3:05 AM  Result Value Ref Range   Opiates NONE DETECTED NONE DETECTED   Cocaine NONE DETECTED NONE DETECTED   Benzodiazepines NONE DETECTED NONE DETECTED   Amphetamines NONE DETECTED NONE DETECTED   Tetrahydrocannabinol NONE DETECTED NONE DETECTED   Barbiturates NONE DETECTED NONE DETECTED    Comment:        DRUG SCREEN FOR MEDICAL PURPOSES ONLY.  IF CONFIRMATION IS NEEDED FOR ANY PURPOSE, NOTIFY LAB WITHIN 5 DAYS.        LOWEST DETECTABLE LIMITS FOR URINE DRUG SCREEN Drug Class       Cutoff (ng/mL) Amphetamine      1000 Barbiturate      200 Benzodiazepine   801 Tricyclics       655 Opiates          300 Cocaine          300 THC              50     Current Facility-Administered Medications  Medication Dose Route Frequency Provider Last Rate Last Dose  . amphetamine-dextroamphetamine (ADDERALL XR) 24 hr capsule 20 mg  20 mg Oral Daily Antonietta Breach, PA-C   20 mg at 12/06/15 1007  . lamoTRIgine (LAMICTAL) tablet 150 mg  150 mg Oral Daily Antonietta Breach, PA-C   150 mg at 12/06/15 1007  . ondansetron (ZOFRAN-ODT) disintegrating tablet 4 mg  4 mg Oral Q8H PRN Antonietta Breach, PA-C      . pantoprazole (PROTONIX) EC tablet 80 mg  80 mg Oral Daily  [employer], PA-C   80 mg at 12/06/15 1007  . propranolol ER (INDERAL LA) 24 hr capsule 80 mg  80 mg Oral Daily [employer], PA-C   80 mg at 12/06/15 1007  . risperiDONE (RISPERDAL) tablet 2 mg  2 mg Oral QHS Antonietta Breach, PA-C      . Rivaroxaban (XARELTO) tablet 15 mg  15 mg Oral BID WC Antonietta Breach, PA-C   15 mg at 12/06/15 3748   Current Outpatient Prescriptions  Medication Sig Dispense Refill  . amphetamine-dextroamphetamine (ADDERALL XR) 20 MG 24 hr capsule Take 1 capsule (20 mg total) by mouth daily. 30 capsule 0  . HYDROcodone-acetaminophen (NORCO/VICODIN) 5-325 MG tablet Take 1 tablet by mouth every 8 (eight) hours as needed. for pain  0  . lamoTRIgine (LAMICTAL) 150 MG tablet Take 1 tablet (150 mg total) by mouth daily. 30 tablet 1  . loperamide (IMODIUM) 2 MG capsule Take 2 mg by mouth 4 (four) times daily as needed. For diarrhea  0  . omeprazole (PRILOSEC) 40 MG capsule Take 1 capsule (40 mg total) by mouth 2 (two) times daily. 60 capsule 1  . ondansetron (ZOFRAN-ODT) 4 MG disintegrating tablet Take 4 mg by mouth every 8 (eight) hours as needed. For nausea.  0  . PARoxetine (PAXIL) 30 MG tablet Take 1 tablet (30 mg total) by mouth daily. 30 tablet 2  . propranolol ER (INDERAL LA) 80 MG 24 hr capsule Take 1 capsule (80 mg total) by mouth daily. 30 capsule 5  . RELPAX 40 MG tablet Take 1  tablet (40 mg total) by mouth every 2 (two) hours as needed for migraine or headache. 10 tablet 3  . risperiDONE (RISPERDAL) 2 MG tablet Take 1 tablet (2 mg total) by mouth at bedtime. 30 tablet 1  . XARELTO 15 MG TABS tablet Take 15 mg by mouth 2 (two) times daily. 21 day therapy course patient began on 11/26/15  0    Musculoskeletal: Strength & Muscle Tone: within normal limits Gait & Station: normal Patient leans: N/A  Psychiatric Specialty Exam: Physical Exam  Nursing note and vitals reviewed. Constitutional: He is oriented to person, place, and time.  Neck: Normal range of motion.   Respiratory: Effort normal.  Musculoskeletal: Normal range of motion.  Neurological: He is alert and oriented to person, place, and time.  Skin: Skin is warm and dry.  Psychiatric: He has a normal mood and affect. His speech is normal and behavior is normal. Thought content normal. Cognition and memory are normal. He expresses impulsivity.    Review of Systems  Psychiatric/Behavioral: Positive for depression (Stable) and substance abuse. Negative for suicidal ideas. The patient is not nervous/anxious and does not have insomnia.   All other systems reviewed and are negative.   Blood pressure 124/76, pulse 72, temperature 98 F (36.7 C), temperature source Oral, resp. rate 16, SpO2 100 %.There is no weight on file to calculate BMI.  General Appearance: Casual  Eye Contact:  Good  Speech:  Clear and Coherent and Normal Rate  Volume:  Normal  Mood:  Depressed  Affect:  Congruent  Thought Process:  Coherent and Goal Directed  Orientation:  Full (Time, Place, and Person)  Thought Content:  Logical  Suicidal Thoughts:  No  Homicidal Thoughts:  No  Memory:  Immediate;   Good Recent;   Good Remote;   Good  Judgement:  Fair  Insight:  Present  Psychomotor Activity:  Normal  Concentration:  Concentration: Fair and Attention Span: Fair  Recall:  Good  Fund of Knowledge:  Good  Language:  Good  Akathisia:  No  Handed:  Right  AIMS (if indicated):     Assets:  Communication Skills Desire for Improvement Housing Social Support  ADL's:  Intact  Cognition:  WNL  Sleep:        Treatment Plan Summary: Plan Discharge home and follow up with Dr. Adele Schilder  Disposition: No evidence of imminent risk to self or others at present.   Patient does not meet criteria for psychiatric inpatient admission.  Rankin, Shuvon, NP 12/06/2015 12:12 PM Patient seen face-to-face for psychiatric evaluation, chart reviewed and case discussed with the physician extender and developed treatment plan. Reviewed  the information documented and agree with the treatment plan. Corena Pilgrim, MD

## 2015-12-06 NOTE — ED Notes (Addendum)
Pt here from home with family requesting detox from heroin and is taking opioids for pain. Pt last used heroin 12 hours ago. Pt has also been feeling increased anxiety. Pt has his brother and mother with him at time of assessment. Pt denies SI, HI. Pt states he had an argument with his wife today regarding his addiction. Pt got angry today and put his arm through a car window and has had other "fits of rage".

## 2015-12-06 NOTE — ED Provider Notes (Signed)
CSN: [id number]     Arrival date & time 12/06/15  0010 History  By signing my name below, I, Alyssa GroveMartin Green, attest that this documentation has been prepared under the direction and in the presence of TRW AutomotiveKelly Breean Nannini, PA-C. Electronically Signed: Alyssa GroveMartin Green, ED Scribe. 12/06/2015. 5:16 AM.    Chief Complaint  Patient presents with  . Medical Clearance   The history is provided by the patient, a relative and a parent. No language interpreter was used.   HPI Comments: Timothy [name] is a 45 y.o. male with PMHx of drug abuse who presents to the Emergency Department wanting to detox from opiates. He reports consistent use of heroin and states that he also used 5-7 hydrocodone tablets today. Pt went for a routine checkup at behavioral health on 11/25/15 with Dr. Lolly MustacheArfeen who suggested he present here for worsening of his substance use. Pt reports that he last used heroin 12 hours ago. Pt reports to snorting heroin. Pt states has used "a lot" since his visit with behavioral health.   Patient recently was told to d/c Paxil and to start Celexa. He states that he did not feel the Paxil was helping him. The patient, however, recalls doing "crazy stuff" while on Celexa; therefore, he decided to remain on Paxil and to not change these medications as suggested by Dr. Lolly MustacheArfeen. Pt denies any thoughts of hurting anyone or himself. Pt denies alcohol use. Pt's brother reports that pt put his arm through a car window today and that the patient has had fits of anger and rage since around 9 PM tonight.    Past Medical History  Diagnosis Date  . Helicobacter pylori gastritis 06/30/06  . GERD (gastroesophageal reflux disease)   . Chronic headache   . Carpal tunnel syndrome on both sides 03/28/2013  . 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 TIA (transient ischemic attack)    Past Surgical History  Procedure Laterality Date  . Unremarkable     Family History  Problem Relation Age of Onset  . Heart disease    .  Barrett's esophagus Mother   . Breast cancer Mother   . Diabetes Paternal Grandfather   . Heart disease Paternal Grandfather   . Irritable bowel syndrome Brother   . Allergies Brother   . Heart disease Paternal Grandmother   . Colon cancer Neg Hx   . Esophageal cancer Neg Hx    Social History  Substance Use Topics  . Smoking status: Never Smoker   . Smokeless tobacco: Never Used  . Alcohol Use: No    Review of Systems  Psychiatric/Behavioral: Positive for behavioral problems and agitation. Negative for suicidal ideas. The patient is nervous/anxious.   All other systems reviewed and are negative.   Allergies  Ioxaglate and Ivp dye  Home Medications   Prior to Admission medications   Medication Sig Start Date End Date Taking? Authorizing Provider  amphetamine-dextroamphetamine (ADDERALL XR) 20 MG 24 hr capsule Take 1 capsule (20 mg total) by mouth daily. 11/20/15  Yes Waymon Budgelinton D Young, MD  HYDROcodone-acetaminophen (NORCO/VICODIN) 5-325 MG tablet Take 1 tablet by mouth every 8 (eight) hours as needed. for pain 12/01/15  Yes Historical Provider, MD  lamoTRIgine (LAMICTAL) 150 MG tablet Take 1 tablet (150 mg total) by mouth daily. 11/25/15 11/24/16 Yes Cleotis NipperSyed T Arfeen, MD  loperamide (IMODIUM) 2 MG capsule Take 2 mg by mouth 4 (four) times daily as needed. For diarrhea 11/13/15  Yes Historical Provider, MD  omeprazole (PRILOSEC) 40 MG capsule Take  1 capsule (40 mg total) by mouth 2 (two) times daily. 09/17/15  Yes Ruffin FrederickSteven Paul Armbruster, MD  ondansetron (ZOFRAN-ODT) 4 MG disintegrating tablet Take 4 mg by mouth every 8 (eight) hours as needed. For nausea. 11/13/15  Yes Historical Provider, MD  PARoxetine (PAXIL) 30 MG tablet Take 1 tablet (30 mg total) by mouth daily. 12/02/15 12/01/16 Yes Cleotis NipperSyed T Arfeen, MD  propranolol ER (INDERAL LA) 80 MG 24 hr capsule Take 1 capsule (80 mg total) by mouth daily. 11/05/15  Yes Donika K Patel, DO  RELPAX 40 MG tablet Take 1 tablet (40 mg total) by mouth every 2  (two) hours as needed for migraine or headache. 11/05/15  Yes Donika K Patel, DO  risperiDONE (RISPERDAL) 2 MG tablet Take 1 tablet (2 mg total) by mouth at bedtime. 11/25/15 11/24/16 Yes Cleotis NipperSyed T Arfeen, MD  XARELTO 15 MG TABS tablet Take 15 mg by mouth 2 (two) times daily. 21 day therapy course patient began on 11/26/15 11/26/15  Yes Historical Provider, MD   BP 141/80 mmHg  Pulse 79  Temp(Src) 98 F (36.7 C) (Oral)  Resp 18  SpO2 97%   Physical Exam  Constitutional: He is oriented to person, place, and time. He appears well-developed and well-nourished. No distress.  HENT:  Head: Normocephalic and atraumatic.  Eyes: Conjunctivae and EOM are normal. No scleral icterus.  Neck: Normal range of motion.  Pulmonary/Chest: Effort normal. No respiratory distress.  Musculoskeletal: Normal range of motion.  Neurological: He is alert and oriented to person, place, and time.  Skin: Skin is warm and dry. No rash noted. He is not diaphoretic. No erythema. No pallor.  Psychiatric: He has a normal mood and affect. His speech is normal. He is agitated (mild). He expresses no homicidal and no suicidal ideation.  Nursing note and vitals reviewed.   ED Course  Procedures (including critical care time)  DIAGNOSTIC STUDIES: Oxygen Saturation is 97% on RA, normal by my interpretation.    Labs Review Labs Reviewed  CBC WITH DIFFERENTIAL/PLATELET - Abnormal; Notable for the following:    RBC 4.03 (*)    Hemoglobin 12.9 (*)    HCT 37.8 (*)    All other components within normal limits  COMPREHENSIVE METABOLIC PANEL  ETHANOL  URINE RAPID DRUG SCREEN, HOSP PERFORMED    Imaging Review No results found.   I have personally reviewed and evaluated these images and lab results as part of my medical decision-making.   EKG Interpretation None      MDM   Final diagnoses:  Severe opioid use disorder    45 year old male presents to the emergency department for evaluation of agitation as well as heroin  abuse. He is pending placement following evaluation by TTS. Disposition to be determined by oncoming ED provider. Patient medically cleared.  I personally performed the services described in this documentation, which was scribed in my presence. The recorded information has been reviewed and is accurate.       Antony MaduraKelly Jermichael Belmares, PA-C 12/06/15 [id number]  Zadie Rhineonald Wickline, MD 12/06/15 [id number]

## 2015-12-06 NOTE — ED Notes (Signed)
Pt arrived on unit denies current SI, HI, a / v h, and anxiety, depression, and pain at this time. Pt reports that he is just tired. Pt contracts for safety a,d went to bed.

## 2015-12-07 DIAGNOSIS — F1110 Opioid abuse, uncomplicated: Secondary | ICD-10-CM | POA: Insufficient documentation

## 2015-12-09 ENCOUNTER — Ambulatory Visit (HOSPITAL_COMMUNITY): Payer: Self-pay | Admitting: Licensed Clinical Social Worker

## 2015-12-16 ENCOUNTER — Ambulatory Visit (HOSPITAL_COMMUNITY): Payer: Self-pay | Admitting: Licensed Clinical Social Worker

## 2015-12-16 ENCOUNTER — Telehealth: Payer: Self-pay | Admitting: Oncology

## 2015-12-16 NOTE — Telephone Encounter (Signed)
[facility] [place] Appointment made for the patient with Shadad on 7/28 at 2:00pm. She will contact the patient about the appointment.

## 2015-12-24 ENCOUNTER — Ambulatory Visit: Payer: Self-pay | Admitting: Gastroenterology

## 2016-01-01 ENCOUNTER — Ambulatory Visit: Payer: Self-pay | Admitting: Oncology

## 2016-01-20 ENCOUNTER — Ambulatory Visit: Payer: Self-pay | Admitting: Neurology

## 2016-01-28 ENCOUNTER — Ambulatory Visit: Payer: Self-pay | Admitting: Neurology

## 2016-02-01 ENCOUNTER — Ambulatory Visit (HOSPITAL_COMMUNITY): Payer: Self-pay | Admitting: Psychiatry

## 2016-02-29 ENCOUNTER — Ambulatory Visit: Payer: Self-pay | Admitting: Pulmonary Disease

## 2016-02-29 ENCOUNTER — Other Ambulatory Visit (HOSPITAL_COMMUNITY): Payer: Self-pay

## 2016-02-29 DIAGNOSIS — F3132 Bipolar disorder, current episode depressed, moderate: Secondary | ICD-10-CM

## 2016-02-29 MED ORDER — PAROXETINE HCL 30 MG PO TABS: 30.0000 mg | ORAL_TABLET | Freq: Every day | ORAL | 0 refills | Status: DC

## 2016-02-29 MED ORDER — RISPERIDONE 2 MG PO TABS: 2.0000 mg | ORAL_TABLET | Freq: Every day | ORAL | 0 refills | Status: DC

## 2016-02-29 MED ORDER — LAMOTRIGINE 150 MG PO TABS: 150.0000 mg | ORAL_TABLET | Freq: Every day | ORAL | 0 refills | Status: DC

## 2016-05-05 ENCOUNTER — Telehealth (HOSPITAL_COMMUNITY): Payer: Self-pay

## 2016-05-05 NOTE — Telephone Encounter (Signed)
Needs to be seen before prescribing any medication.

## 2016-05-05 NOTE — Telephone Encounter (Signed)
Medication refill request - F[facility] from [facility] for Paroxetine.  Patient was cancelled 02/01/16 due to provider out, no showed for Dr. Michae KavaAgarwal 09/29/15 and last seen by Dr. Rutherford Limerickadepalli 08/25/15.  No current appointment scheduled.  We provided all new orders for one time 02/29/16 with instruction patient would need to make an appointment but has not done so to this point.

## 2016-05-05 NOTE — Telephone Encounter (Signed)
[facility] would [facility] be seen for any further refills.

## 2016-05-28 ENCOUNTER — Other Ambulatory Visit: Payer: Self-pay | Admitting: Neurology

## 2016-07-04 ENCOUNTER — Other Ambulatory Visit: Payer: Self-pay | Admitting: Neurology

## 2016-07-04 NOTE — Telephone Encounter (Signed)
Rx sent with no refills.  Patient needs a follow up appointment.

## 2016-07-19 ENCOUNTER — Other Ambulatory Visit: Payer: Self-pay | Admitting: Neurology

## 2016-09-10 DIAGNOSIS — I824Y1 Acute embolism and thrombosis of unspecified deep veins of right proximal lower extremity: Secondary | ICD-10-CM | POA: Insufficient documentation

## 2016-09-14 ENCOUNTER — Telehealth: Payer: Self-pay | Admitting: *Deleted

## 2016-09-14 NOTE — Telephone Encounter (Signed)
"  This is [facility] calling about a referral we faxed to [phone].  Was it received?  Has an appointment been scheduled?  Will you be calling the patient within a week?"  New patient coordinator "confirmed receipt and is working on this".  Notified [facility] staff of this information.

## 2016-09-21 ENCOUNTER — Encounter: Payer: Self-pay | Admitting: Hematology

## 2016-09-21 ENCOUNTER — Telehealth: Payer: Self-pay | Admitting: Hematology

## 2016-09-21 NOTE — Telephone Encounter (Signed)
Received a call from Tanya at [facility] to schedule the pt an appt. Appt has been scheduled for the pt to see Dr. Candise Che on 5/21 at 1pm. Kenney Houseman will give the pt the appt date and time.

## 2016-10-04 HISTORY — PX: KNEE SURGERY: SHX244

## 2016-10-24 ENCOUNTER — Encounter: Payer: 59 | Admitting: Hematology

## 2016-10-24 ENCOUNTER — Encounter: Payer: Self-pay | Admitting: *Deleted

## 2016-10-24 NOTE — Progress Notes (Signed)
Pt was arrived to MD for new pt apt 33 minutes late.  Per Dr. Candise CheKale, pt will need to reschedule in order to allow for enough time for evaluation.  SW pt and wife in the lobby, pt and wife unhappy, stated they called and "someone" told them they did not have to come 30 minutes prior to apt time.  This RN apologized and explained it would no longer be enough time for proper evaluation.  Scheduling message sent high priority to reschedule for next available apt.

## 2016-10-27 ENCOUNTER — Telehealth: Payer: Self-pay | Admitting: *Deleted

## 2016-10-27 ENCOUNTER — Telehealth: Payer: Self-pay | Admitting: Hematology

## 2016-10-27 NOTE — Telephone Encounter (Signed)
Lft the pt a vm to try to reschedule an appt

## 2016-10-27 NOTE — Telephone Encounter (Signed)
"  Someone called my wife at [phone]. My wife forwarded the message to me.  I was late for my appointment Monday and was told I need to reschedule.  Call me at [phone] to discuss rescheduling my appointment."    Will notify new patient coordinator.  This number provided added to demographics.

## 2016-11-01 ENCOUNTER — Telehealth: Payer: Self-pay | Admitting: Hematology

## 2016-11-01 NOTE — Telephone Encounter (Signed)
Spoke to the pt's wife to reschedule his appt. Per the pt's wife he needed an early morning appt because he has to travel 1 1/2 hr to get to his job. Appt has been scheduled for the pt to see Dr. Candise CheKale on 6/28 at 840am. Aware to arrive early in order to be checked in on time. Voiced understanding.

## 2016-12-01 ENCOUNTER — Encounter: Payer: Self-pay | Admitting: Hematology

## 2016-12-01 ENCOUNTER — Ambulatory Visit (HOSPITAL_BASED_OUTPATIENT_CLINIC_OR_DEPARTMENT_OTHER): Payer: 59 | Admitting: Hematology

## 2016-12-01 VITALS — BP 106/62 | HR 58 | Temp 97.9°F | Resp 20 | Ht 76.0 in | Wt 274.0 lb

## 2016-12-01 DIAGNOSIS — D6859 Other primary thrombophilia: Secondary | ICD-10-CM

## 2016-12-01 DIAGNOSIS — I2699 Other pulmonary embolism without acute cor pulmonale: Secondary | ICD-10-CM

## 2016-12-01 DIAGNOSIS — I82531 Chronic embolism and thrombosis of right popliteal vein: Secondary | ICD-10-CM | POA: Diagnosis not present

## 2016-12-01 NOTE — Patient Instructions (Signed)
Thank you for choosing [facility] to provide your oncology and hematology care.  To afford each patient quality time with our providers, please arrive 30 minutes before your scheduled appointment time.  If you arrive late for your appointment, you may be asked to reschedule.  We strive to give you quality time with our providers, and arriving late affects you and other patients whose appointments are after yours.  If you are a no show for multiple scheduled visits, you may be dismissed from the clinic at the providers discretion.   Again, thank you for choosing [facility], our hope is that these requests will decrease the amount of time that you wait before being seen by our physicians.  ______________________________________________________________________ Should you have questions after your visit to the [facility], please contact our office at [phone] between the hours of 8:30 and 4:30 p.m.    Voicemails left after 4:30p.m will not be returned until the following business day.   For prescription refill requests, please have your pharmacy contact us directly.  Please also try to allow 48 hours for prescription requests.   Please contact the scheduling department for questions regarding scheduling.  For scheduling of procedures such as PET scans, CT scans, MRI, Ultrasound, etc please contact central scheduling at [phone].   Resources For Cancer Patients and Caregivers:  American Cancer Society:  800-227-2345  Can help patients locate various types of support and financial assistance Cancer Care: 1-800-813-HOPE (4673) Provides financial assistance, online support groups, medication/co-pay assistance.   [place] DSS:  336-641-3447 Where to apply for food stamps, Medicaid, and utility assistance Medicare Rights Center: 800-333-4114 Helps people with Medicare understand their rights and benefits, navigate the Medicare system, and secure the  quality healthcare they deserve SCAT: 336-333-6589  [employer]'s shared-ride transportation service for eligible riders who have a disability that prevents them from riding the fixed route bus.   For additional information on assistance programs please contact our social worker:   Grier Hock/Abigail Elmore:  [phone] 

## 2016-12-01 NOTE — Progress Notes (Signed)
Marland Kitchen    HEMATOLOGY/ONCOLOGY CONSULTATION NOTE  Date of Service: 12/01/2016  Patient Care Team: Barron Alvine, MD as PCP - General (Family Medicine)  CHIEF COMPLAINTS/PURPOSE OF CONSULTATION:  DVT  HISTORY OF PRESENTING ILLNESS:   Timothy [name] is a wonderful 46 y.o. male who has been referred to [place] by Dr .Barron Alvine, MD / Tacey Ruiz NP for evaluation and management of DVT  Patient has a history of Mosaic Klinefelter syndrome, GERD, Helicobacter pylori gastritis, heroin abuse, chronic headaches. Patient reports history of chewing tobacco and notes that he had likely bilateral lower lobe pulmonary embolism in June 2013 diagnosed at [facility]. Patient reports he was on testosterone for hypogonadism at the time. he notes that he is unsure if he had a DVT at the time.  Patient reports he was had a right lower extremity DVT in June 2017 presenting with a right lower extremity pain. He reports that he travels and was driving a lot. He was started on Xarelto and was on Xarelto for about a year. He notes that a repeat ultrasound of his lower extremity bilaterally on 10/11/2016 showed 1. Residual eccentric chronic thrombus in the right popliteal vein at site of prior occlusive DVT, now nonocclusive. Previous posterior tibial venous thrombus has resolved. No evidence of acute component. 2. No left lower extremity DVT.   Patient notes that he continues to be on Xarelto at this time as well as his primary care physician.  He reports no family history of venous thromboembolism. He has had a previous personal history of questionable TIA.   Notes that he is now sober after undergoing a court-ordered rehabilitation for heroin abuse for 6 months. He has lost some weight due to his drug use last year but has been gaining this back again. Currently he has no significant bilateral leg pain or swelling and no chest pain or shortness of breath.  MEDICAL HISTORY:  Past Medical History:    Diagnosis Date  . Carpal tunnel syndrome on both sides 03/28/2013  . Chronic headache   . DVT (deep venous thrombosis) (HCC)    2017-2018: 4 dvts  . GERD (gastroesophageal reflux disease)   . Helicobacter pylori gastritis 06/30/06  . 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 Pulmonary embolism (HCC)    2013: 2 total  . TIA (transient ischemic attack)     SURGICAL HISTORY: Past Surgical History:  Procedure Laterality Date  . unremarkable      SOCIAL HISTORY: Social History   Social History  . Marital status: Married    Spouse name: N/A  . Number of children: 1  . Years of education: N/A   Occupational History  . Full time student    Social History Main Topics  . Smoking status: Never Smoker  . Smokeless tobacco: Former Neurosurgeon    Quit date: 2013  . Alcohol use No  . Drug use: Yes    Types: Other-see comments, Oxycodone, Heroin     Comment: Former Use - 12/04/15  . Sexual activity: Not Currently    Partners: Female   Other Topics Concern  . Not on file   Social History Narrative   Lives with wife and children in a one story home.  Has 2 children.     Works in Engineering geologist.     Education: college.    FAMILY HISTORY: Family History  Problem Relation Age of Onset  . Barrett's esophagus Mother   . Breast cancer Mother   . Irritable bowel syndrome Brother   .  Allergies Brother   . Diabetes Brother   . Heart disease Father   . Heart attack Father   . Diabetes Sister   . Diabetes Paternal Grandfather   . Heart disease Paternal Grandfather   . Heart disease Paternal Grandmother   . Heart disease Maternal Grandmother   . Heart disease Maternal Grandfather   . Colon cancer Neg Hx   . Esophageal cancer Neg Hx     ALLERGIES:  is allergic to ioxaglate and ivp dye [iodinated diagnostic agents].  MEDICATIONS:  Current Outpatient Prescriptions  Medication Sig Dispense Refill  . omeprazole (PRILOSEC) 40 MG capsule Take 1 capsule (40 mg total) by mouth 2 (two) times  daily. 60 capsule 1  . PARoxetine (PAXIL) 30 MG tablet Take 1 tablet (30 mg total) by mouth daily. 30 tablet 0  . propranolol ER (INDERAL LA) 80 MG 24 hr capsule TAKE ONE CAPSULE EVERY DAY 30 capsule 0  . XARELTO 15 MG TABS tablet Take 15 mg by mouth 2 (two) times daily. 21 day therapy course patient began on 11/26/15  0   No current facility-administered medications for this visit.     REVIEW OF SYSTEMS:    10 Point review of Systems was done is negative except as noted above.  PHYSICAL EXAMINATION: ECOG PERFORMANCE STATUS: 0 - Asymptomatic  . Vitals:   12/01/16 0846  BP: 106/62  Pulse: (!) 58  Resp: 20  Temp: 97.9 F (36.6 C)   Filed Weights   12/01/16 0846  Weight: 274 lb (124.3 kg)   .Body mass index is 33.35 kg/m.  GENERAL:alert, in no acute distress and comfortable SKIN: no acute rashes, no significant lesions EYES: conjunctiva are pink and non-injected, sclera anicteric OROPHARYNX: MMM, no exudates, no oropharyngeal erythema or ulceration NECK: supple, no JVD LYMPH:  no palpable lymphadenopathy in the cervical, axillary or inguinal regions LUNGS: clear to auscultation b/l with normal respiratory effort HEART: regular rate & rhythm ABDOMEN:  normoactive bowel sounds , non tender, not distended. No palpable hepatosplenomegaly Extremity: no pedal edema PSYCH: alert & oriented x 3 with fluent speech NEURO: no focal motor/sensory deficits  LABORATORY DATA:  I have reviewed the data as listed  . CBC Latest Ref Rng & Units 12/06/2015 06/04/2015  WBC 4.0 - 10.5 K/uL 6.7 6.2  Hemoglobin 13.0 - 17.0 g/dL 12.9(L) 14.9  Hematocrit 39.0 - 52.0 % 37.8(L) 42.5  Platelets 150 - 400 K/uL 160 176    . CMP Latest Ref Rng & Units 12/06/2015 06/04/2015  Glucose 65 - 99 mg/dL 96 562(Z117(H)  BUN 6 - 20 mg/dL 14 16  Creatinine 3.080.61 - 1.24 mg/dL 6.570.96 8.461.04  Sodium 962135 - 145 mmol/L 141 143  Potassium 3.5 - 5.1 mmol/L 4.1 4.7  Chloride 101 - 111 mmol/L 111 102  CO2 22 - 32 mmol/L 24  26  Calcium 8.9 - 10.3 mg/dL 8.9 9.3  Total Protein 6.5 - 8.1 g/dL 7.0 6.8  Total Bilirubin 0.3 - 1.2 mg/dL 0.3 0.2  Alkaline Phos 38 - 126 U/L 72 89  AST 15 - 41 U/L 26 21  ALT 17 - 63 U/L 23 19     RADIOGRAPHIC STUDIES: I have personally reviewed the radiological images as listed and agreed with the findings in the report. No results found.  ASSESSMENT & PLAN:   46 year old male with history of Klinefelter syndrome with  #1 bilateral lower lobe pulmonary embolism in 2013 ? Unprovoked. Patient notes that he might have been on testosterone at the  time.  #2 right calf DVT in June 2017 no clear provoking factor. Recent ultrasound May 2018 shows chronic DVT-nonocclusive. The patient notes that he was driving a lot and traveling long distances.  #3 history of Klinefelter syndrome which itself is noted to be associated with increased risk of venous thromboembolism.  Plan  - patient has had recurrent venous thromboembolic episodes in the absence of definitive provoking factors and with the ongoing risk factor from his Klinefelter syndrome and possible need for testosterone. -Given these elements would recommend long-term anticoagulation due to high risk of recurrent events . -Reasonable to continue his Xarelto long-term in the absence of bleeding issues or change in liver or kidney function . -If the patient shows high-risk drug abuse with risk of falls or injuries might need to reconsider his decision and consider aspirin alone or prophylactic dose of Xarelto in the future . -Continue follow-up with primary care physician for management of anticoagulation.  #4 . Patient Active Problem List   Diagnosis Date Noted  . Bipolar affect, depressed (HCC) 12/06/2015  . Severe opioid use disorder (HCC)   . Recurrent vertigo 02/12/2015  . Generalized anxiety disorder 12/31/2014  . Major depression, chronic 12/31/2014  . Hypersomnolence disorder, acute, moderate 12/31/2014  . Migraine  without aura and without status migrainosus, not intractable 10/15/2014  . Essential tremor 10/15/2014  . Upper airway resistance syndrome 09/22/2014  . Carpal tunnel syndrome on both sides 03/28/2013  . 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 Pleuritic chest pain 11/22/2011  . Cough 11/22/2011  . Hypersomnia 11/05/2010   -f/u with PCP for mx of other medical co-morbids  All of the patients questions were answered with apparent satisfaction. The patient knows to call the clinic with any problems, questions or concerns.  I spent 40 minutes counseling the patient face to face. The total time spent in the appointment was 60 minutes and more than 50% was on counseling and direct patient cares.    Wyvonnia Lora MD MS AAHIVMS [facility] Hematology/Oncology Physician [facility]  (Office):       [phone] (Work cell):  [phone] (Fax):           [phone]  12/01/2016 9:08 AM

## 2016-12-05 ENCOUNTER — Telehealth: Payer: Self-pay | Admitting: Hematology

## 2016-12-05 NOTE — Telephone Encounter (Signed)
Per 6/28 los- RTC on an as needed basis

## 2016-12-29 ENCOUNTER — Ambulatory Visit: Payer: Self-pay | Admitting: Acute Care

## 2017-01-02 ENCOUNTER — Telehealth: Payer: Self-pay | Admitting: *Deleted

## 2017-01-02 NOTE — Telephone Encounter (Signed)
"  [facility] calling to obtain Dr. Clyda GreenerKale's 12-01-2016 office note for this patient for [place] MethEmily Dolleschell FNP with Dr. Hollice EspyGibson."  Will notify provider.

## 2017-01-20 ENCOUNTER — Telehealth: Payer: Self-pay

## 2017-01-20 NOTE — Telephone Encounter (Signed)
Last OV note from 12/01/16 faxed on 01/10/17. Confirmed fax receipt at 0840 same day.

## 2017-02-15 MED FILL — PARoxetine HCL 30 MG TABS: 30 | 30 days supply | Qty: 30 | Fill #0

## 2017-03-27 MED FILL — PARoxetine HCL 30 MG TABS: 30 | 30 days supply | Qty: 30 | Fill #1

## 2017-04-13 ENCOUNTER — Emergency Department (HOSPITAL_COMMUNITY): Payer: Self-pay

## 2017-04-13 ENCOUNTER — Emergency Department (HOSPITAL_COMMUNITY)
Admission: EM | Admit: 2017-04-13 | Discharge: 2017-04-13 | Disposition: A | Discharge status: Home / Self Care | Payer: Self-pay | Attending: Emergency Medicine | Admitting: Emergency Medicine

## 2017-04-13 ENCOUNTER — Other Ambulatory Visit: Payer: Self-pay

## 2017-04-13 DIAGNOSIS — Z041 Encounter for examination and observation following transport accident: Secondary | ICD-10-CM | POA: Insufficient documentation

## 2017-04-13 DIAGNOSIS — R079 Chest pain, unspecified: Secondary | ICD-10-CM | POA: Insufficient documentation

## 2017-04-13 DIAGNOSIS — R51 Headache: Secondary | ICD-10-CM | POA: Insufficient documentation

## 2017-04-13 DIAGNOSIS — Z8673 Personal history of transient ischemic attack (TIA), and cerebral infarction without residual deficits: Secondary | ICD-10-CM | POA: Insufficient documentation

## 2017-04-13 DIAGNOSIS — Z79899 Other long term (current) drug therapy: Secondary | ICD-10-CM | POA: Insufficient documentation

## 2017-04-13 DIAGNOSIS — Z86718 Personal history of other venous thrombosis and embolism: Secondary | ICD-10-CM | POA: Insufficient documentation

## 2017-04-13 DIAGNOSIS — Q984 Klinefelter syndrome, unspecified: Secondary | ICD-10-CM | POA: Insufficient documentation

## 2017-04-13 LAB — URINALYSIS, ROUTINE W REFLEX MICROSCOPIC
BILIRUBIN URINE: NEGATIVE
GLUCOSE, UA: NEGATIVE mg/dL
HGB URINE DIPSTICK: NEGATIVE
KETONES UR: NEGATIVE mg/dL
LEUKOCYTES UA: NEGATIVE
Nitrite: NEGATIVE
PH: 5 (ref 5.0–8.0)
PROTEIN: NEGATIVE mg/dL
Specific Gravity, Urine: 1.021 (ref 1.005–1.030)

## 2017-04-13 LAB — CBC WITH DIFFERENTIAL/PLATELET
BASOS ABS: 0 10*3/uL (ref 0.0–0.1)
BASOS PCT: 0 %
Eosinophils Absolute: 0.1 10*3/uL (ref 0.0–0.7)
Eosinophils Relative: 2 %
HEMATOCRIT: 38.9 % — AB (ref 39.0–52.0)
HEMOGLOBIN: 13 g/dL (ref 13.0–17.0)
LYMPHS PCT: 32 %
Lymphs Abs: 1.9 10*3/uL (ref 0.7–4.0)
MCH: 32.2 pg (ref 26.0–34.0)
MCHC: 33.4 g/dL (ref 30.0–36.0)
MCV: 96.3 fL (ref 78.0–100.0)
MONO ABS: 0.5 10*3/uL (ref 0.1–1.0)
MONOS PCT: 8 %
NEUTROS ABS: 3.4 10*3/uL (ref 1.7–7.7)
NEUTROS PCT: 58 %
Platelets: 124 10*3/uL — ABNORMAL LOW (ref 150–400)
RBC: 4.04 MIL/uL — ABNORMAL LOW (ref 4.22–5.81)
RDW: 13.8 % (ref 11.5–15.5)
WBC: 6 10*3/uL (ref 4.0–10.5)

## 2017-04-13 LAB — RAPID URINE DRUG SCREEN, HOSP PERFORMED
AMPHETAMINES: POSITIVE — AB
BARBITURATES: NOT DETECTED
BENZODIAZEPINES: POSITIVE — AB
COCAINE: NOT DETECTED
OPIATES: NOT DETECTED
TETRAHYDROCANNABINOL: NOT DETECTED

## 2017-04-13 LAB — BASIC METABOLIC PANEL
ANION GAP: 5 (ref 5–15)
BUN: 10 mg/dL (ref 6–20)
CHLORIDE: 109 mmol/L (ref 101–111)
CO2: 27 mmol/L (ref 22–32)
Calcium: 8.5 mg/dL — ABNORMAL LOW (ref 8.9–10.3)
Creatinine, Ser: 0.87 mg/dL (ref 0.61–1.24)
GFR calc non Af Amer: >60 mL/min (ref >60)
GLUCOSE: 111 mg/dL — AB (ref 65–99)
POTASSIUM: 5.2 mmol/L — AB (ref 3.5–5.1)
Sodium: 141 mmol/L (ref 135–145)

## 2017-04-13 LAB — ETHANOL: Alcohol, Ethyl (B): <10 mg/dL (ref <10)

## 2017-04-13 MED ORDER — KETOROLAC TROMETHAMINE 30 MG/ML IJ SOLN
30.0000 mg | Freq: Once | INTRAMUSCULAR | Status: AC
  Administered 2017-04-13: 30 mg via INTRAVENOUS
  Filled 2017-04-13: qty 1

## 2017-04-13 MED ORDER — SODIUM CHLORIDE 0.9 % IV BOLUS (SEPSIS)
1000.0000 mL | Freq: Once | INTRAVENOUS | Status: AC
  Administered 2017-04-13: 1000 mL via INTRAVENOUS
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
## 2017-04-13 NOTE — ED Notes (Signed)
Patient transported to CT 

## 2017-04-13 NOTE — ED Notes (Signed)
Unable to start IV. Irving BurtonEmily, RN at bedside attempting.
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
## 2017-04-13 NOTE — ED Notes (Signed)
Pt. To CT via stretcher. 

## 2017-04-18 MED FILL — OMEPRAZOLE DR 40 MG CAPSULE: 40 | 30 days supply | Qty: 60 | Fill #0

## 2017-04-18 MED FILL — PROPRANOLOL ER 80 MG CAP: 80 | 30 days supply | Qty: 30 | Fill #0

## 2017-04-18 MED FILL — traZODone HCL 50 MG TABS: 50 | 30 days supply | Qty: 30 | Fill #0

## 2017-04-28 ENCOUNTER — Ambulatory Visit (HOSPITAL_COMMUNITY)
Admission: EM | Admit: 2017-04-28 | Discharge: 2017-04-28 | Disposition: A | Discharge status: Home / Self Care | Payer: 59 | Attending: Physician Assistant | Admitting: Physician Assistant

## 2017-04-28 ENCOUNTER — Ambulatory Visit (INDEPENDENT_AMBULATORY_CARE_PROVIDER_SITE_OTHER): Payer: 59

## 2017-04-28 ENCOUNTER — Encounter (HOSPITAL_COMMUNITY): Payer: Self-pay | Admitting: Emergency Medicine

## 2017-04-28 ENCOUNTER — Other Ambulatory Visit: Payer: Self-pay

## 2017-04-28 DIAGNOSIS — R0789 Other chest pain: Secondary | ICD-10-CM

## 2017-04-28 MED ORDER — CYCLOBENZAPRINE HCL 10 MG PO TABS: 5.0000 mg | ORAL_TABLET | Freq: Three times a day (TID) | ORAL | 0 refills | Status: DC | PRN

## 2017-04-28 MED ORDER — PREDNISONE 20 MG PO TABS: ORAL_TABLET | ORAL | 0 refills | Status: DC

## 2017-04-28 MED FILL — CYCLOBENZAPRINE 10 MG TAB: 10 | 10 days supply | Qty: 30 | Fill #0

## 2017-04-28 MED FILL — predniSONE 20 MG TABS: 20 | 9 days supply | Qty: 18 | Fill #0
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
## 2017-04-28 NOTE — Discharge Instructions (Signed)
Give the CT surgery folks a call.  I would have liked to prescribe meloxicam however given your history of anticoaguant use and history of GERD I worry that I may make you worse.  Lets try the pred and flexeril for pain relief and hold your Ibuprofen for now.

## 2017-05-02 MED FILL — XARELTO 20 MG TABLET: 20 | 30 days supply | Qty: 30 | Fill #0

## 2017-05-09 MED FILL — SERTRALINE HCL 50 MG TABLET: 50 | 30 days supply | Qty: 30 | Fill #0

## 2017-05-11 ENCOUNTER — Ambulatory Visit: Payer: Self-pay | Admitting: Pulmonary Disease

## 2017-05-11 MED FILL — traZODone HCL 50 MG TABS: 50 | 30 days supply | Qty: 30 | Fill #0

## 2017-05-15 ENCOUNTER — Ambulatory Visit: Payer: Self-pay | Admitting: Adult Health

## 2017-05-18 ENCOUNTER — Encounter (HOSPITAL_COMMUNITY): Payer: Self-pay | Admitting: *Deleted

## 2017-05-18 ENCOUNTER — Telehealth (HOSPITAL_COMMUNITY): Payer: Self-pay | Admitting: Emergency Medicine

## 2017-05-18 ENCOUNTER — Other Ambulatory Visit: Payer: Self-pay

## 2017-05-18 ENCOUNTER — Ambulatory Visit (HOSPITAL_COMMUNITY)
Admission: EM | Admit: 2017-05-18 | Discharge: 2017-05-18 | Disposition: A | Discharge status: Home / Self Care | Payer: 59 | Attending: Urgent Care | Admitting: Urgent Care

## 2017-05-18 DIAGNOSIS — R05 Cough: Secondary | ICD-10-CM

## 2017-05-18 DIAGNOSIS — J40 Bronchitis, not specified as acute or chronic: Secondary | ICD-10-CM | POA: Diagnosis not present

## 2017-05-18 DIAGNOSIS — R062 Wheezing: Secondary | ICD-10-CM

## 2017-05-18 DIAGNOSIS — R059 Cough, unspecified: Secondary | ICD-10-CM

## 2017-05-18 MED ORDER — ALBUTEROL SULFATE HFA 108 (90 BASE) MCG/ACT IN AERS: 1.0000 | INHALATION_SPRAY | Freq: Four times a day (QID) | RESPIRATORY_TRACT | 0 refills | Status: DC | PRN

## 2017-05-18 MED ORDER — BENZONATATE 100 MG PO CAPS: 100.0000 mg | ORAL_CAPSULE | Freq: Three times a day (TID) | ORAL | 0 refills | Status: DC | PRN

## 2017-05-18 MED ORDER — PREDNISONE 20 MG PO TABS: ORAL_TABLET | ORAL | 0 refills | Status: DC

## 2017-05-18 MED ORDER — AZITHROMYCIN 250 MG PO TABS: 250.0000 mg | ORAL_TABLET | Freq: Every day | ORAL | 0 refills | Status: DC

## 2017-05-18 MED FILL — predniSONE 20 MG TABS: 20 | 5 days supply | Qty: 10 | Fill #0

## 2017-05-18 MED FILL — BENZONATATE 100 MG CAPS: 100 | 10 days supply | Qty: 60 | Fill #0

## 2017-05-18 MED FILL — AZITHROMYCIN 250 MG TABLET: 250 | 5 days supply | Qty: 6 | Fill #0

## 2017-05-18 MED FILL — VENTOLIN HFA 90 MCG INHALER: 108 (90 BAS | 25 days supply | Qty: 18 | Fill #0

## 2017-05-18 NOTE — Telephone Encounter (Signed)
Pharmacy called to have inhaler RX resent; resent per request

## 2017-05-18 NOTE — ED Triage Notes (Signed)
Per pt he's coughing yellow mucus, per pt when he coughs his chest hurts, per pt it started about 2 wks ago
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
## 2017-05-22 MED FILL — PROPRANOLOL ER 80 MG CAP: 80 | 30 days supply | Qty: 30 | Fill #1

## 2017-05-23 MED FILL — OMEPRAZOLE DR 40 MG CAPSULE: 40 | 30 days supply | Qty: 60 | Fill #0

## 2017-06-08 MED FILL — XARELTO 20 MG TABLET: 20 | 30 days supply | Qty: 30 | Fill #1

## 2017-06-13 ENCOUNTER — Encounter: Payer: 59 | Admitting: Thoracic Surgery (Cardiothoracic Vascular Surgery)

## 2017-06-13 MED FILL — traZODone HCL 50 MG TABS: 50 | 30 days supply | Qty: 30 | Fill #0

## 2017-06-13 MED FILL — SERTRALINE HCL 50 MG TABLET: 50 | 30 days supply | Qty: 30 | Fill #0

## 2017-06-13 NOTE — Progress Notes (Signed)
This encounter was created in error - please disregard.

## 2017-06-14 ENCOUNTER — Encounter: Payer: Self-pay | Admitting: Adult Health

## 2017-06-14 ENCOUNTER — Ambulatory Visit (INDEPENDENT_AMBULATORY_CARE_PROVIDER_SITE_OTHER): Payer: 59 | Admitting: Adult Health

## 2017-06-14 DIAGNOSIS — G4710 Hypersomnia, unspecified: Secondary | ICD-10-CM | POA: Diagnosis not present

## 2017-06-14 NOTE — Assessment & Plan Note (Signed)
Healthy sleep regimen discussed  Extensive pt education given on safe use of controlled substances. And dangers of mixing meds/drugs and driving .    We discussed that combination of Adderall , Xanax and Heroin are a very dangerous combination . Pt education on dangers of driving under the influence . Explained that we would not be able to continue to prescribe Adderall .  Advised on healthy sleep regimen  He is advised to follow back with our office As needed  For sleep issues As needed    Plan  Patient Instructions  Healthy sleep regimen as discussed.  Do not drive if sleepy  Follow up Dr. Craige CottaSood  As needed

## 2017-06-14 NOTE — Progress Notes (Signed)
I have reviewed and agree with assessment/plan.  Coralyn HellingVineet Abygail Galeno, MD [facility] 06/14/2017, 1:00 PM Pager:  [phone]
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
## 2017-06-14 NOTE — Patient Instructions (Addendum)
Healthy sleep regimen as discussed.  Do not drive if sleepy  Follow up Dr. Craige CottaSood  As needed

## 2017-06-23 MED FILL — PROPRANOLOL ER 80 MG CAP: 80 | 30 days supply | Qty: 30 | Fill #2

## 2017-06-26 MED FILL — OMEPRAZOLE DR 40 MG CAPSULE: 40 | 30 days supply | Qty: 60 | Fill #0

## 2017-07-11 ENCOUNTER — Encounter: Payer: 59 | Admitting: Thoracic Surgery (Cardiothoracic Vascular Surgery)

## 2017-07-12 MED FILL — SERTRALINE HCL 50 MG TABLET: 50 | 30 days supply | Qty: 30 | Fill #1

## 2017-07-12 MED FILL — traZODone HCL 50 MG TABS: 50 | 30 days supply | Qty: 30 | Fill #1

## 2017-07-13 MED FILL — XARELTO 20 MG TABLET: 20 | 30 days supply | Qty: 30 | Fill #0

## 2017-07-27 MED FILL — OMEPRAZOLE DR 40 MG CAPSULE: 40 | 30 days supply | Qty: 60 | Fill #1

## 2017-08-02 ENCOUNTER — Encounter: Payer: 59 | Admitting: Cardiothoracic Surgery

## 2017-08-04 ENCOUNTER — Telehealth: Payer: Self-pay | Admitting: *Deleted

## 2017-08-08 MED FILL — SERTRALINE HCL 50 MG TABLET: 50 | 30 days supply | Qty: 30 | Fill #0

## 2017-08-08 MED FILL — traZODone HCL 50 MG TABS: 50 | 30 days supply | Qty: 30 | Fill #0

## 2017-08-14 MED FILL — PROPRANOLOL ER 80 MG CAP: 80 | 30 days supply | Qty: 30 | Fill #3

## 2017-08-18 ENCOUNTER — Other Ambulatory Visit: Payer: Self-pay

## 2017-08-18 ENCOUNTER — Encounter (HOSPITAL_COMMUNITY): Payer: Self-pay | Admitting: Emergency Medicine

## 2017-08-18 ENCOUNTER — Ambulatory Visit (HOSPITAL_COMMUNITY)
Admission: EM | Admit: 2017-08-18 | Discharge: 2017-08-18 | Disposition: A | Discharge status: Home / Self Care | Payer: 59 | Attending: Family Medicine | Admitting: Family Medicine

## 2017-08-18 DIAGNOSIS — J40 Bronchitis, not specified as acute or chronic: Secondary | ICD-10-CM

## 2017-08-18 DIAGNOSIS — G8929 Other chronic pain: Secondary | ICD-10-CM

## 2017-08-18 DIAGNOSIS — M25561 Pain in right knee: Secondary | ICD-10-CM

## 2017-08-18 MED ORDER — IPRATROPIUM BROMIDE 0.06 % NA SOLN: 2.0000 | Freq: Four times a day (QID) | NASAL | 0 refills | Status: DC

## 2017-08-18 MED ORDER — FLUTICASONE PROPIONATE 50 MCG/ACT NA SUSP: 2.0000 | Freq: Every day | NASAL | 0 refills | Status: DC

## 2017-08-18 MED ORDER — AZITHROMYCIN 250 MG PO TABS: 250.0000 mg | ORAL_TABLET | Freq: Every day | ORAL | 0 refills | Status: DC

## 2017-08-18 MED ORDER — BENZONATATE 100 MG PO CAPS: 100.0000 mg | ORAL_CAPSULE | Freq: Three times a day (TID) | ORAL | 0 refills | Status: DC

## 2017-08-18 MED FILL — BENZONATATE 100 MG CAPS: 100 | 7 days supply | Qty: 21 | Fill #0

## 2017-08-18 MED FILL — AZITHROMYCIN 250 MG TABLET: 250 | 5 days supply | Qty: 6 | Fill #0

## 2017-08-18 MED FILL — IPRATROPIUM 0.06% SPRAY: 0.06 | 30 days supply | Qty: 15 | Fill #0

## 2017-08-18 NOTE — Discharge Instructions (Signed)
Azithromycin for bronchitis. Tessalon for cough. Start flonase, atrovent nasal spray for nasal congestion/drainage. You can use over the counter nasal saline rinse such as neti pot for nasal congestion. Keep hydrated, your urine should be clear to pale yellow in color. Tylenol/motrin for fever and pain. Monitor for any worsening of symptoms, chest pain, shortness of breath, wheezing, swelling of the throat, follow up for reevaluation.   For sore throat try using a honey-based tea. Use 3 teaspoons of honey with juice squeezed from half lemon. Place shaved pieces of ginger into 1/2-1 cup of water and warm over stove top. Then mix the ingredients and repeat every 4 hours as needed.  Tylenol for knee pain. Ice compress, elevation, knee sleeve during activity. Follow up with orthopedics for further evaluation and management needed.

## 2017-08-18 NOTE — ED Triage Notes (Signed)
C/o productive cough with "yellow" mucus and rhinitis onset over a week and states has chronic right knee pain which is swelling today

## 2017-08-18 NOTE — ED Provider Notes (Signed)
[facility]    CSN: [id number] Arrival date & time: 08/18/17  1420     History   Chief Complaint Chief Complaint  Patient presents with  . Cough  . Knee Pain    HPI Timothy [name] is a 47 y.o. male.   47 year old male comes in for over 1.5 week history of URI symptoms. Productive cough, rhinorrhea, nasal congestion, mild sore throat. Denies fever, chills, night sweats. otc cold medicine with temp relief. Denies chest pain, shortness of breath, wheezing. Never smoker. Inhaler use in the past.   Patient states that he has chronic right knee pain, sees a orthopedics for it.  States a few weeks ago, got fluid drawn out of the knee and had a corticosteroid injection. States symptoms improved but gradually returning.  States painful weightbearing.  Denies erythema, increased warmth.  Taking Xarelto as directed.      Past Medical History:  Diagnosis Date  . Carpal tunnel syndrome on both sides 03/28/2013  . Chronic headache   . DVT (deep venous thrombosis) (HCC)    2017-2018: 4 dvts  . GERD (gastroesophageal reflux disease)   . Helicobacter pylori gastritis 06/30/06  . 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 Pulmonary embolism (HCC)    2013: 2 total  . TIA (transient ischemic attack)     Patient Active Problem List   Diagnosis Date Noted  . Bipolar affect, depressed (HCC) 12/06/2015  . Severe opioid use disorder (HCC)   . Recurrent vertigo 02/12/2015  . Generalized anxiety disorder 12/31/2014  . Major depression, chronic 12/31/2014  . Hypersomnolence disorder, acute, moderate 12/31/2014  . Migraine without aura and without status migrainosus, not intractable 10/15/2014  . Essential tremor 10/15/2014  . Upper airway resistance syndrome 09/22/2014  . Carpal tunnel syndrome on both sides 03/28/2013  . 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 Pleuritic chest pain 11/22/2011  . Cough 11/22/2011  . Hypersomnia 11/05/2010    Past Surgical History:  Procedure  Laterality Date  . unremarkable         Home Medications    Prior to Admission medications   Medication Sig Start Date End Date Taking? Authorizing Provider  azithromycin (ZITHROMAX) 250 MG tablet Take 1 tablet (250 mg total) by mouth daily. Take first 2 tablets together, then 1 every day until finished. 08/18/17   Cathie Hoops, Cara Thaxton V, PA-C  benzonatate (TESSALON) 100 MG capsule Take 1 capsule (100 mg total) by mouth every 8 (eight) hours. 08/18/17   Cathie Hoops, Kainan Patty V, PA-C  buprenorphine (SUBUTEX) 2 MG SUBL SL tablet Place 8 mg under the tongue daily. Given at methadone clinic     [provider]  fluticasone (FLONASE) 50 MCG/ACT nasal spray Place 2 sprays into both nostrils daily. 08/18/17   Cathie Hoops, Lian Pounds V, PA-C  ipratropium (ATROVENT) 0.06 % nasal spray Place 2 sprays into both nostrils 4 (four) times daily. 08/18/17   Cathie Hoops, Mikie Misner V, PA-C  omeprazole (PRILOSEC) 40 MG capsule Take 1 capsule (40 mg total) by mouth 2 (two) times daily. 09/17/15   Armbruster, Willaim Rayas, MD  propranolol ER (INDERAL LA) 80 MG 24 hr capsule TAKE ONE CAPSULE EVERY DAY 07/04/16   Nita Sickle K, DO  sertraline (ZOLOFT) 20 MG/ML concentrated solution Take 20 mg by mouth daily.    [provider]  XARELTO 15 MG TABS tablet Take 15 mg at bedtime by mouth. 21 day therapy course patient began on 11/26/15 11/26/15   [provider]    Family History Family History  Problem Relation Age of Onset  . Barrett's esophagus Mother   . Breast cancer Mother   . Irritable bowel syndrome Brother   . Allergies Brother   . Diabetes Brother   . Heart disease Father   . Heart attack Father   . Diabetes Sister   . Diabetes Paternal Grandfather   . Heart disease Paternal Grandfather   . Heart disease Paternal Grandmother   . Heart disease Maternal Grandmother   . Heart disease Maternal Grandfather   . Colon cancer Neg Hx   . Esophageal cancer Neg Hx     Social History Social History   Tobacco Use  . Smoking status: Never  Smoker  . Smokeless tobacco: Former Engineer, water Use Topics  . Alcohol use: No    Alcohol/week: 0.0 oz  . Drug use: Yes    Types: Other-see comments, Oxycodone, Heroin    Comment: Former Use - 12/04/15     Allergies   Ioxaglate and Ivp dye [iodinated diagnostic agents]   Review of Systems Review of Systems  Reason unable to perform ROS: See HPI as above.     Physical Exam Triage Vital Signs ED Triage Vitals  Enc Vitals Group     BP 08/18/17 1507 105/61     Pulse Rate 08/18/17 1507 66     Resp --      Temp 08/18/17 1507 98 F (36.7 C)     Temp Source 08/18/17 1507 Oral     SpO2 08/18/17 1507 96 %     Weight --      Height --      Head Circumference --      Peak Flow --      Pain Score 08/18/17 1505 4     Pain Loc --      Pain Edu? --      Excl. in GC? --    No data found.  Updated Vital Signs BP 105/61 (BP Location: Left Arm)   Pulse 66   Temp 98 F (36.7 C) (Oral)   SpO2 96%   Physical Exam  Constitutional: He is oriented to person, place, and time. He appears well-developed and well-nourished. No distress.  HENT:  Head: Normocephalic and atraumatic.  Right Ear: Tympanic membrane, external ear and ear canal normal. Tympanic membrane is not erythematous and not bulging.  Left Ear: Tympanic membrane, external ear and ear canal normal. Tympanic membrane is not erythematous and not bulging.  Nose: Nose normal. Right sinus exhibits no maxillary sinus tenderness and no frontal sinus tenderness. Left sinus exhibits no maxillary sinus tenderness and no frontal sinus tenderness.  Mouth/Throat: Uvula is midline, oropharynx is clear and moist and mucous membranes are normal.  Eyes: Conjunctivae are normal. Pupils are equal, round, and reactive to light.  Neck: Normal range of motion. Neck supple.  Cardiovascular: Normal rate, regular rhythm and normal heart sounds. Exam reveals no gallop and no friction rub.  No murmur heard. Pulmonary/Chest: Effort normal and  breath sounds normal. He has no decreased breath sounds. He has no wheezes. He has no rhonchi. He has no rales.  Musculoskeletal:  No erythema, increased warmth, swelling. No obvious tenderness to palpation. Full ROM of knee. Strength normal and equal bilaterally. Sensation intact and equal bilaterally.   Lymphadenopathy:    He has no cervical adenopathy.  Neurological: He is alert and oriented to person, place, and time.  Skin: Skin is warm and dry.  Psychiatric: He has a normal mood and affect. His  behavior is normal. Judgment normal.     UC Treatments / Results  Labs (all labs ordered are listed, but only abnormal results are displayed) Labs Reviewed - No data to display  EKG  EKG Interpretation None       Radiology No results found.  Procedures Procedures (including critical care time)  Medications Ordered in UC Medications - No data to display   Initial Impression / Assessment and Plan / UC Course  I have reviewed the triage vital signs and the nursing notes.  Pertinent labs & imaging results that were available during my care of the patient were reviewed by me and considered in my medical decision making (see chart for details).    Will start azithromycin for bronchitis.  Other symptomatic treatment discussed.  Push fluids.  Return precautions given.  Chronic right knee pain with recurring swelling.  Patient already established with orthopedics.  Will have patient take Tylenol for the pain given on Xarelto.  Ice compress, elevation, knee sleeve during activity.  Patient to follow-up with orthopedics for further evaluation and management needed.  Return precautions given.  Patient expresses understanding and agrees to plan.  Final Clinical Impressions(s) / UC Diagnoses   Final diagnoses:  Bronchitis  Chronic pain of right knee    ED Discharge Orders        Ordered    azithromycin (ZITHROMAX) 250 MG tablet  Daily     08/18/17 1557    benzonatate (TESSALON)  100 MG capsule  Every 8 hours     08/18/17 1557    fluticasone (FLONASE) 50 MCG/ACT nasal spray  Daily     08/18/17 1557    ipratropium (ATROVENT) 0.06 % nasal spray  4 times daily     08/18/17 1557        Belinda FisherYu, Jerren Flinchbaugh V, PA-C 08/18/17 1604

## 2017-08-21 ENCOUNTER — Ambulatory Visit (HOSPITAL_COMMUNITY)
Admission: EM | Admit: 2017-08-21 | Discharge: 2017-08-21 | Disposition: A | Discharge status: Home / Self Care | Payer: 59 | Attending: Family Medicine | Admitting: Family Medicine

## 2017-08-21 ENCOUNTER — Encounter (HOSPITAL_COMMUNITY): Payer: Self-pay | Admitting: Emergency Medicine

## 2017-08-21 DIAGNOSIS — M25561 Pain in right knee: Secondary | ICD-10-CM

## 2017-08-21 DIAGNOSIS — G8929 Other chronic pain: Secondary | ICD-10-CM

## 2017-08-21 MED FILL — XARELTO 20 MG TABLET: 20 | 30 days supply | Qty: 30 | Fill #1

## 2017-08-21 NOTE — ED Triage Notes (Signed)
Pt c/o R knee swelling, states "I have to have fluid pulled off of it every three weeks". Pt made appt with ortho but its not for several weeks out.
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
## 2017-08-28 MED FILL — OMEPRAZOLE DR 40 MG CAPSULE: 40 | 30 days supply | Qty: 60 | Fill #2

## 2017-08-30 ENCOUNTER — Encounter: Payer: 59 | Admitting: Cardiothoracic Surgery

## 2017-09-14 MED FILL — PROPRANOLOL ER 80 MG CAP: 80 | 30 days supply | Qty: 30 | Fill #0

## 2017-09-14 MED FILL — XARELTO 20 MG TABLET: 20 | 30 days supply | Qty: 30 | Fill #0

## 2017-09-14 MED FILL — ONE TOUCH VERIO TEST STRIP: 25 days supply | Qty: 50 | Fill #0

## 2017-09-14 MED FILL — traZODone HCL 50 MG TABS: 50 | 30 days supply | Qty: 30 | Fill #2

## 2017-09-14 MED FILL — ONE TOUCH VERIO IQ METER: W/DEVICE | 30 days supply | Qty: 1 | Fill #0

## 2017-09-14 MED FILL — SERTRALINE HCL 50 MG TABLET: 50 | 30 days supply | Qty: 30 | Fill #2

## 2017-09-14 MED FILL — ONE TOUCH DELICA 33G LANCET: 31 days supply | Qty: 100 | Fill #0

## 2017-10-11 MED FILL — traMADol HCL 50 MG TABS: 50 | 5 days supply | Qty: 30 | Fill #0

## 2017-10-13 MED FILL — SERTRALINE HCL 50 MG TABLET: 50 | 30 days supply | Qty: 30 | Fill #1

## 2017-10-14 MED FILL — traZODone HCL 50 MG TABS: 50 | 30 days supply | Qty: 30 | Fill #1

## 2017-10-18 MED FILL — ZUBSOLV 5.7-1.4 MG TAB SL: 5.7-1.4 | 7 days supply | Qty: 14 | Fill #0

## 2017-11-21 MED FILL — OMEPRAZOLE DR 40 MG CAPSULE: 40 | 30 days supply | Qty: 60 | Fill #0

## 2017-11-21 MED FILL — ZUBSOLV 5.7-1.4 MG TAB SL: 5.7-1.4 | 28 days supply | Qty: 56 | Fill #0

## 2017-11-21 MED FILL — SERTRALINE HCL 100 MG TAB: 100 | 30 days supply | Qty: 30 | Fill #0

## 2017-11-21 MED FILL — traZODone HCL 50 MG TABS: 50 | 30 days supply | Qty: 30 | Fill #2

## 2017-11-21 MED FILL — PROPRANOLOL ER 80 MG CAP: 80 | 30 days supply | Qty: 30 | Fill #4

## 2017-12-18 ENCOUNTER — Ambulatory Visit (INDEPENDENT_AMBULATORY_CARE_PROVIDER_SITE_OTHER): Payer: 59 | Admitting: Pulmonary Disease

## 2017-12-18 ENCOUNTER — Encounter: Payer: Self-pay | Admitting: Pulmonary Disease

## 2017-12-18 VITALS — BP 108/70 | HR 63 | Ht 77.0 in | Wt 277.2 lb

## 2017-12-18 DIAGNOSIS — G4711 Idiopathic hypersomnia with long sleep time: Secondary | ICD-10-CM

## 2017-12-18 MED ORDER — ARMODAFINIL 150 MG PO TABS: 150.0000 mg | ORAL_TABLET | Freq: Every day | ORAL | 2 refills | Status: DC

## 2017-12-18 MED FILL — ARMODAFINIL 150 MG TABLET: 150 | 30 days supply | Qty: 30 | Fill #0

## 2017-12-18 MED FILL — XARELTO 20 MG TABLET: 20 | 30 days supply | Qty: 30 | Fill #0

## 2017-12-18 MED FILL — PROPRANOLOL ER 80 MG CAP: 80 | 30 days supply | Qty: 30 | Fill #5
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
## 2017-12-18 NOTE — Patient Instructions (Signed)
Armodafanil (nuvigil) 150 mg pill in the morning  Follow up in 6 to 8 weeks with Dr. Craige CottaSood or Nurse Practitioner

## 2017-12-19 MED FILL — traZODone HCL 100 MG TABS: 100 | 30 days supply | Qty: 30 | Fill #0

## 2017-12-19 MED FILL — SERTRALINE HCL 100 MG TAB: 100 | 30 days supply | Qty: 30 | Fill #0

## 2017-12-19 MED FILL — ZUBSOLV 5.7-1.4 MG TAB SL: 5.7-1.4 | 28 days supply | Qty: 56 | Fill #0

## 2018-01-09 MED FILL — tiZANidine HCL 2 MG TABS: 2 | 10 days supply | Qty: 30 | Fill #0

## 2018-01-09 MED FILL — OMEPRAZOLE 40 MG CPDR: 40 | 30 days supply | Qty: 60 | Fill #0

## 2018-01-16 MED FILL — ZUBSOLV 5.7-1.4 MG TAB SL: 5.7-1.4 | 28 days supply | Qty: 56 | Fill #0

## 2018-01-16 MED FILL — traZODone HCL 100 MG TABS: 100 | 30 days supply | Qty: 30 | Fill #0

## 2018-01-16 MED FILL — SERTRALINE HCL 100 MG TAB: 100 | 30 days supply | Qty: 30 | Fill #0

## 2018-01-24 MED FILL — ARMODAFINIL 150 MG TABLET: 150 | 30 days supply | Qty: 30 | Fill #1

## 2018-01-29 ENCOUNTER — Encounter: Payer: Self-pay | Admitting: Adult Health

## 2018-01-29 ENCOUNTER — Ambulatory Visit (INDEPENDENT_AMBULATORY_CARE_PROVIDER_SITE_OTHER): Payer: 59 | Admitting: Adult Health

## 2018-01-29 DIAGNOSIS — G4710 Hypersomnia, unspecified: Secondary | ICD-10-CM | POA: Diagnosis not present

## 2018-01-29 MED ORDER — ARMODAFINIL 150 MG PO TABS: 150.0000 mg | ORAL_TABLET | Freq: Every day | ORAL | 5 refills | Status: DC

## 2018-01-29 NOTE — Progress Notes (Signed)
Reviewed and agree with assessment/plan.   Israella Hubert, MD [facility] 06/01/2016, 12:24 PM Pager:  [phone]  
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
## 2018-01-29 NOTE — Addendum Note (Signed)
Addended by: Boone MasterJONES, Keshawna Dix E on: 01/29/2018 05:23 PM   Modules accepted: Orders

## 2018-01-29 NOTE — Patient Instructions (Signed)
Continue on Armodafanil (nuvigil) 150 mg pill in the morning Healthy sleep hygiene .  Follow up with Dr. Craige CottaSood  In 6 months and As needed

## 2018-01-29 NOTE — Assessment & Plan Note (Signed)
Improved with Nuvigil . Pt education given . Cont on current regimen   Plan  Patient Instructions  Continue on Armodafanil (nuvigil) 150 mg pill in the morning Healthy sleep hygiene .  Follow up with Dr. Craige CottaSood  In 6 months and As needed

## 2018-01-31 MED FILL — XARELTO 20 MG TABLET: 20 | 30 days supply | Qty: 30 | Fill #1

## 2018-02-07 MED FILL — OMEPRAZOLE 40 MG CPDR: 40 | 30 days supply | Qty: 60 | Fill #1

## 2018-02-08 MED FILL — PROPRANOLOL ER 80 MG CAP: 80 | 30 days supply | Qty: 30 | Fill #0

## 2018-02-13 MED FILL — traZODone HCL 100 MG TABS: 100 | 30 days supply | Qty: 30 | Fill #0

## 2018-02-13 MED FILL — ZUBSOLV 5.7-1.4 MG TAB SL: 5.7-1.4 | 7 days supply | Qty: 14 | Fill #0

## 2018-02-13 MED FILL — SERTRALINE HCL 100 MG TAB: 100 | 30 days supply | Qty: 30 | Fill #0

## 2018-02-15 MED FILL — MELOXICAM 15 MG TABLET: 15 | 30 days supply | Qty: 30 | Fill #0

## 2018-02-20 MED FILL — ZUBSOLV 5.7-1.4 MG TAB SL: 5.7-1.4 | 28 days supply | Qty: 56 | Fill #0

## 2018-02-27 MED FILL — PENICILLIN VK 500 MG TABLET: 500 | 7 days supply | Qty: 26 | Fill #0

## 2018-02-28 ENCOUNTER — Ambulatory Visit (INDEPENDENT_AMBULATORY_CARE_PROVIDER_SITE_OTHER): Payer: 59 | Admitting: Sports Medicine

## 2018-02-28 VITALS — BP 120/78 | Ht 76.0 in | Wt 250.0 lb

## 2018-02-28 DIAGNOSIS — M170 Bilateral primary osteoarthritis of knee: Secondary | ICD-10-CM

## 2018-02-28 MED ORDER — METHYLPREDNISOLONE ACETATE 40 MG/ML IJ SUSP
40.0000 mg | Freq: Once | INTRAMUSCULAR | Status: AC
  Administered 2018-02-28: 40 mg via INTRA_ARTICULAR

## 2018-02-28 NOTE — Progress Notes (Signed)
HPI  CC:   Timothy [name] is a 47 year old male who presents for bilateral knee pain.  He has been a patient in [facility] for the past 7 years.  He has been found to have bilateral end-stage osteoarthritis of both knees.  He underwent a scope of his right knee in 2018 with a cleanout.  Most recently, he was seen in August 2019, at which point he was told his left knee may need a scope as well for cleanout.  He presents today for second opinion on this matter.  He states the pain is been getting progressively worse.  It is worse when he stands from a sit.  Is also worse when he bends down to pick things up.  He states prolonged walking also irritates it.  He has been taken meloxicam daily for around 2 years now.  He also has tried ibuprofen, Tylenol, Aleve, ice, and he with no improvement.  He denies any numbness and tingling.  He denies any weakness in the legs.  He states it is affecting his day-to-day life.  Past Injuries: Meniscal injury in the right knee Past Surgeries: Right knee scope Smoking: Non-smoker, quit in 2013 Family Hx: Noncontributory  All past medical history, medications, and allergies were reviewed myself today's visit.  ROS: Per HPI; in addition no fever, no rash, no additional weakness, no additional numbness, no additional paresthesias, and no additional falls/injury.   Objective: BP 120/78   Ht 6\' 4"  (1.93 m)   Wt 250 lb (113.4 kg)   BMI 30.43 kg/m  Gen: NAD, well groomed, a/o x3, normal affect.  CV: Well-perfused. Warm.  Resp: Non-labored.  Neuro: Sensation intact throughout. No gross coordination deficits.  Gait: Nonpathologic posture, unremarkable stride without signs of limp or balance issues.  Bilateral knee exam: No erythema or warmth noted.  Mild to moderate swelling noted in the left knee.  Tenderness palpation along the medial joint line as well as the inferior pole of the patella bilaterally.  Full range of motion knee extension.  Limited range of  motion in knee flexion, to around 40 degrees.  Strength 5 out of 5 throughout testing.  Negative Lachman, negative posterior drawer, negative valgus stress testing, positive varus stress testing on the right side.  Negative McMurray testing bilaterally.  Positive Thessaly testing on the right side.   Assessment and Plan: Bilateral end-stage degenerative changes of knees.  History of scope in 2018 of right knee.  INJECTION: Patient was given informed consent, signed copy in the chart. Appropriate time out was taken. Area prepped and draped in usual sterile fashion. 1 cc of methylprednisolone 40 mg/ml plus  3 cc of 1% lidocaine without epinephrine was injected into the right knee using a lateral approach. The patient tolerated the procedure well. There were no complications. Post procedure instructions were given.  INJECTION: Patient was given informed consent, signed copy in the chart. Appropriate time out was taken. Area prepped and draped in usual sterile fashion. 1 cc of methylprednisolone 40 mg/ml plus  3 cc of 1% lidocaine without epinephrine was injected into the left knee using a lateral approach. The patient tolerated the procedure well. There were no complications. Post procedure instructions were given.  We discussed treatment options with Mr. Faller at today's visit.  Has had a pretty thorough work-up at [facility] to this date.  We did discuss there are some additional conservative options he could try.  We will start her in physical therapy for strengthening of his  quadriceps at this time.  We also discussed Visco therapy with him as an additional treatment option outside of steroid injections.  I did give him a corticosteroid injection into bilateral knees at today's visit.  He tolerated procedure well, see note as above.  We could also consider starting an offloading brace on bilateral knees.  We will discuss this at follow-up in 6 weeks.  Alric QuanBlake Dixon, MD [facility]  Fellow 02/28/2018 11:39 AM

## 2018-03-01 ENCOUNTER — Encounter: Payer: Self-pay | Admitting: Sports Medicine

## 2018-03-06 MED FILL — ARMODAFINIL 150 MG TABLET: 150 | 30 days supply | Qty: 30 | Fill #2

## 2018-03-20 MED FILL — MELOXICAM 15 MG TABLET: 15 | 30 days supply | Qty: 30 | Fill #1

## 2018-03-20 MED FILL — SERTRALINE HCL 100 MG TAB: 100 | 30 days supply | Qty: 30 | Fill #0

## 2018-03-20 MED FILL — traZODone HCL 100 MG TABS: 100 | 30 days supply | Qty: 30 | Fill #0

## 2018-03-20 MED FILL — ZUBSOLV 5.7-1.4 MG TAB SL: 5.7-1.4 | 28 days supply | Qty: 56 | Fill #0

## 2018-03-20 MED FILL — PROPRANOLOL ER 80 MG CAP: 80 | 30 days supply | Qty: 30 | Fill #1

## 2018-03-21 MED FILL — OMEPRAZOLE 40 MG CPDR: 40 | 30 days supply | Qty: 60 | Fill #0

## 2018-04-13 MED FILL — SERTRALINE HCL 100 MG TAB: 100 | 30 days supply | Qty: 30 | Fill #1

## 2018-04-13 MED FILL — traZODone HCL 100 MG TABS: 100 | 30 days supply | Qty: 30 | Fill #1

## 2018-04-13 MED FILL — PROPRANOLOL ER 80 MG CAP: 80 | 30 days supply | Qty: 30 | Fill #2

## 2018-04-16 ENCOUNTER — Inpatient Hospital Stay (HOSPITAL_COMMUNITY): Payer: 59

## 2018-04-16 ENCOUNTER — Inpatient Hospital Stay (HOSPITAL_COMMUNITY)
Admission: AD | Admit: 2018-04-16 | Discharge: 2018-04-18 | DRG: 915 | Disposition: A | Discharge status: Home / Self Care | Payer: 59 | Source: Other Acute Inpatient Hospital | Attending: Pulmonary Disease | Admitting: Pulmonary Disease

## 2018-04-16 DIAGNOSIS — Z8673 Personal history of transient ischemic attack (TIA), and cerebral infarction without residual deficits: Secondary | ICD-10-CM | POA: Diagnosis not present

## 2018-04-16 DIAGNOSIS — Z86711 Personal history of pulmonary embolism: Secondary | ICD-10-CM

## 2018-04-16 DIAGNOSIS — Z91041 Radiographic dye allergy status: Secondary | ICD-10-CM

## 2018-04-16 DIAGNOSIS — K219 Gastro-esophageal reflux disease without esophagitis: Secondary | ICD-10-CM | POA: Diagnosis present

## 2018-04-16 DIAGNOSIS — Z79899 Other long term (current) drug therapy: Secondary | ICD-10-CM | POA: Diagnosis not present

## 2018-04-16 DIAGNOSIS — Z7901 Long term (current) use of anticoagulants: Secondary | ICD-10-CM

## 2018-04-16 DIAGNOSIS — Z86718 Personal history of other venous thrombosis and embolism: Secondary | ICD-10-CM | POA: Diagnosis not present

## 2018-04-16 DIAGNOSIS — T783XXA Angioneurotic edema, initial encounter: Secondary | ICD-10-CM | POA: Diagnosis present

## 2018-04-16 DIAGNOSIS — F1110 Opioid abuse, uncomplicated: Secondary | ICD-10-CM | POA: Diagnosis present

## 2018-04-16 DIAGNOSIS — J9601 Acute respiratory failure with hypoxia: Secondary | ICD-10-CM | POA: Diagnosis not present

## 2018-04-16 DIAGNOSIS — R220 Localized swelling, mass and lump, head: Secondary | ICD-10-CM | POA: Diagnosis not present

## 2018-04-16 DIAGNOSIS — Z888 Allergy status to other drugs, medicaments and biological substances status: Secondary | ICD-10-CM

## 2018-04-16 DIAGNOSIS — G4711 Idiopathic hypersomnia with long sleep time: Secondary | ICD-10-CM | POA: Diagnosis present

## 2018-04-16 DIAGNOSIS — Z0189 Encounter for other specified special examinations: Secondary | ICD-10-CM

## 2018-04-16 DIAGNOSIS — J9602 Acute respiratory failure with hypercapnia: Secondary | ICD-10-CM | POA: Diagnosis not present

## 2018-04-16 DIAGNOSIS — Q984 Klinefelter syndrome, unspecified: Secondary | ICD-10-CM | POA: Diagnosis not present

## 2018-04-16 LAB — CBC WITH DIFFERENTIAL/PLATELET
ABS IMMATURE GRANULOCYTES: 0.02 10*3/uL (ref 0.00–0.07)
BASOS PCT: 0 %
Basophils Absolute: 0 10*3/uL (ref 0.0–0.1)
Eosinophils Absolute: 0 10*3/uL (ref 0.0–0.5)
Eosinophils Relative: 0 %
HCT: 44.6 % (ref 39.0–52.0)
HEMOGLOBIN: 14.2 g/dL (ref 13.0–17.0)
Immature Granulocytes: 0 %
Lymphocytes Relative: 6 %
Lymphs Abs: 0.6 10*3/uL — ABNORMAL LOW (ref 0.7–4.0)
MCH: 29.9 pg (ref 26.0–34.0)
MCHC: 31.8 g/dL (ref 30.0–36.0)
MCV: 93.9 fL (ref 80.0–100.0)
MONO ABS: 0.1 10*3/uL (ref 0.1–1.0)
MONOS PCT: 1 %
Neutro Abs: 8.4 10*3/uL — ABNORMAL HIGH (ref 1.7–7.7)
Neutrophils Relative %: 93 %
PLATELETS: 174 10*3/uL (ref 150–400)
RBC: 4.75 MIL/uL (ref 4.22–5.81)
RDW: 14 % (ref 11.5–15.5)
WBC: 9.1 10*3/uL (ref 4.0–10.5)
nRBC: 0 % (ref 0.0–0.2)

## 2018-04-16 LAB — PROTIME-INR
INR: 1.1
PROTHROMBIN TIME: 14.1 s (ref 11.4–15.2)

## 2018-04-16 LAB — BLOOD GAS, ARTERIAL
Acid-base deficit: 0.7 mmol/L (ref 0.0–2.0)
BICARBONATE: 24 mmol/L (ref 20.0–28.0)
Drawn by: 535271
FIO2: 40
LHR: 16 {breaths}/min
O2 SAT: 97.3 %
PATIENT TEMPERATURE: 98.6
PCO2 ART: 43.7 mmHg (ref 32.0–48.0)
PEEP: 5 cmH2O
PO2 ART: 97.7 mmHg (ref 83.0–108.0)
VT: 600 mL
pH, Arterial: 7.359 (ref 7.350–7.450)

## 2018-04-16 LAB — COMPREHENSIVE METABOLIC PANEL
ALBUMIN: 3.8 g/dL (ref 3.5–5.0)
ALK PHOS: 73 U/L (ref 38–126)
ALT: 18 U/L (ref 0–44)
ANION GAP: 9 (ref 5–15)
AST: 24 U/L (ref 15–41)
BILIRUBIN TOTAL: 0.6 mg/dL (ref 0.3–1.2)
BUN: 18 mg/dL (ref 6–20)
CALCIUM: 8.8 mg/dL — AB (ref 8.9–10.3)
CO2: 21 mmol/L — AB (ref 22–32)
CREATININE: 1.02 mg/dL (ref 0.61–1.24)
Chloride: 112 mmol/L — ABNORMAL HIGH (ref 98–111)
GFR calc Af Amer: >60 mL/min (ref >60)
GFR calc non Af Amer: >60 mL/min (ref >60)
GLUCOSE: 165 mg/dL — AB (ref 70–99)
Potassium: 4.2 mmol/L (ref 3.5–5.1)
SODIUM: 142 mmol/L (ref 135–145)
TOTAL PROTEIN: 6.5 g/dL (ref 6.5–8.1)

## 2018-04-16 LAB — GLUCOSE, CAPILLARY
GLUCOSE-CAPILLARY: 133 mg/dL — AB (ref 70–99)
GLUCOSE-CAPILLARY: 156 mg/dL — AB (ref 70–99)
Glucose-Capillary: 158 mg/dL — ABNORMAL HIGH (ref 70–99)
Glucose-Capillary: 156 mg/dL — ABNORMAL HIGH (ref 70–99)

## 2018-04-16 LAB — TROPONIN I: Troponin I: 0.03 ng/mL (ref <0.03)

## 2018-04-16 LAB — RAPID URINE DRUG SCREEN, HOSP PERFORMED
AMPHETAMINES: POSITIVE — AB
BENZODIAZEPINES: NOT DETECTED
Barbiturates: NOT DETECTED
Cocaine: NOT DETECTED
Opiates: NOT DETECTED
TETRAHYDROCANNABINOL: NOT DETECTED

## 2018-04-16 LAB — AMYLASE: Amylase: 35 U/L (ref 28–100)

## 2018-04-16 LAB — MAGNESIUM: Magnesium: 1.9 mg/dL (ref 1.7–2.4)

## 2018-04-16 LAB — LIPASE, BLOOD: Lipase: 23 U/L (ref 11–51)

## 2018-04-16 LAB — LACTIC ACID, PLASMA
LACTIC ACID, VENOUS: 1.2 mmol/L (ref 0.5–1.9)
Lactic Acid, Venous: 1.8 mmol/L (ref 0.5–1.9)

## 2018-04-16 LAB — BRAIN NATRIURETIC PEPTIDE: B NATRIURETIC PEPTIDE 5: 91.2 pg/mL (ref 0.0–100.0)

## 2018-04-16 LAB — PHOSPHORUS: Phosphorus: 2.8 mg/dL (ref 2.5–4.6)

## 2018-04-16 LAB — MRSA PCR SCREENING: MRSA BY PCR: POSITIVE — AB

## 2018-04-16 LAB — APTT: aPTT: 30 seconds (ref 24–36)

## 2018-04-16 LAB — TRIGLYCERIDES: Triglycerides: 168 mg/dL — ABNORMAL HIGH (ref <150)

## 2018-04-16 MED ORDER — DOCUSATE SODIUM 50 MG/5ML PO LIQD: 100.0000 mg | Freq: Two times a day (BID) | ORAL | Status: DC | PRN

## 2018-04-16 MED ORDER — CHLORHEXIDINE GLUCONATE 0.12% ORAL RINSE (MEDLINE KIT)
15.0000 mL | Freq: Two times a day (BID) | OROMUCOSAL | Status: DC
  Administered 2018-04-16 – 2018-04-17 (×3): 15 mL via OROMUCOSAL

## 2018-04-16 MED ORDER — FENTANYL BOLUS VIA INFUSION
50.0000 ug | INTRAVENOUS | Status: DC | PRN
  Filled 2018-04-16: qty 50

## 2018-04-16 MED ORDER — METHYLPREDNISOLONE SODIUM SUCC 125 MG IJ SOLR
40.0000 mg | Freq: Two times a day (BID) | INTRAMUSCULAR | Status: DC
  Administered 2018-04-16 (×2): 40 mg via INTRAVENOUS
  Filled 2018-04-16 (×2): qty 2

## 2018-04-16 MED ORDER — ORAL CARE MOUTH RINSE
15.0000 mL | OROMUCOSAL | Status: DC
  Administered 2018-04-16 – 2018-04-17 (×13): 15 mL via OROMUCOSAL

## 2018-04-16 MED ORDER — ALBUTEROL SULFATE (2.5 MG/3ML) 0.083% IN NEBU
2.5000 mg | INHALATION_SOLUTION | Freq: Two times a day (BID) | RESPIRATORY_TRACT | Status: DC
  Administered 2018-04-16 – 2018-04-17 (×2): 2.5 mg via RESPIRATORY_TRACT
  Filled 2018-04-16 (×2): qty 3

## 2018-04-16 MED ORDER — HEPARIN (PORCINE) 25000 UT/250ML-% IV SOLN
1750.0000 [IU]/h | INTRAVENOUS | Status: AC
  Administered 2018-04-16 – 2018-04-17 (×2): 1750 [IU]/h via INTRAVENOUS
  Filled 2018-04-16 (×2): qty 250

## 2018-04-16 MED ORDER — MUPIROCIN 2 % EX OINT
1.0000 "application " | TOPICAL_OINTMENT | Freq: Two times a day (BID) | CUTANEOUS | Status: DC
  Administered 2018-04-16 – 2018-04-18 (×5): 1 via NASAL
  Filled 2018-04-16 (×3): qty 22

## 2018-04-16 MED ORDER — FENTANYL 2500MCG IN NS 250ML (10MCG/ML) PREMIX INFUSION
25.0000 ug/h | INTRAVENOUS | Status: DC
  Administered 2018-04-16 (×2): 175 ug/h via INTRAVENOUS
  Filled 2018-04-16 (×2): qty 250

## 2018-04-16 MED ORDER — SODIUM CHLORIDE 0.9 % IV SOLN
INTRAVENOUS | Status: DC
  Administered 2018-04-16: 12:00:00 via INTRAVENOUS

## 2018-04-16 MED ORDER — VITAL HIGH PROTEIN PO LIQD
1000.0000 mL | ORAL | Status: DC
  Administered 2018-04-16: 1000 mL

## 2018-04-16 MED ORDER — FENTANYL CITRATE (PF) 100 MCG/2ML IJ SOLN
100.0000 ug | Freq: Once | INTRAMUSCULAR | Status: AC
  Administered 2018-04-16: 100 ug via INTRAVENOUS

## 2018-04-16 MED ORDER — SODIUM CHLORIDE 0.9 % IV SOLN
125.00 | INTRAVENOUS | Status: DC
Start: ? — End: 2018-04-16

## 2018-04-16 MED ORDER — FAMOTIDINE 20 MG PO TABS
20.0000 mg | ORAL_TABLET | Freq: Two times a day (BID) | ORAL | Status: DC
  Administered 2018-04-16 – 2018-04-18 (×5): 20 mg via ORAL
  Filled 2018-04-16 (×4): qty 1

## 2018-04-16 MED ORDER — ALBUTEROL SULFATE (2.5 MG/3ML) 0.083% IN NEBU
2.5000 mg | INHALATION_SOLUTION | RESPIRATORY_TRACT | Status: DC
  Administered 2018-04-16 (×2): 2.5 mg via RESPIRATORY_TRACT
  Filled 2018-04-16 (×2): qty 3

## 2018-04-16 MED ORDER — PROPOFOL 1000 MG/100ML IV EMUL
5.0000 ug/kg/min | INTRAVENOUS | Status: DC
  Administered 2018-04-16: 30 ug/kg/min via INTRAVENOUS
  Administered 2018-04-16: 70 ug/kg/min via INTRAVENOUS
  Administered 2018-04-16 (×3): 40 ug/kg/min via INTRAVENOUS
  Administered 2018-04-17 (×2): 30 ug/kg/min via INTRAVENOUS
  Filled 2018-04-16 (×5): qty 100

## 2018-04-16 MED ORDER — PRO-STAT SUGAR FREE PO LIQD
30.0000 mL | Freq: Four times a day (QID) | ORAL | Status: DC
  Administered 2018-04-16 (×2): 30 mL
  Filled 2018-04-16: qty 30

## 2018-04-16 MED ORDER — BISACODYL 10 MG RE SUPP: 10.0000 mg | Freq: Every day | RECTAL | Status: DC | PRN

## 2018-04-16 MED ORDER — PRO-STAT SUGAR FREE PO LIQD
30.0000 mL | Freq: Two times a day (BID) | ORAL | Status: DC
  Administered 2018-04-16: 30 mL

## 2018-04-16 MED ORDER — ENOXAPARIN SODIUM 40 MG/0.4ML ~~LOC~~ SOLN: 40.0000 mg | SUBCUTANEOUS | Status: DC

## 2018-04-16 MED ORDER — FENTANYL CITRATE (PF) 100 MCG/2ML IJ SOLN: 50.0000 ug | Freq: Once | INTRAMUSCULAR | Status: DC

## 2018-04-16 MED ORDER — ACETAMINOPHEN 325 MG PO TABS: 650.0000 mg | ORAL_TABLET | ORAL | Status: DC | PRN

## 2018-04-16 MED ORDER — CHLORHEXIDINE GLUCONATE CLOTH 2 % EX PADS
6.0000 | MEDICATED_PAD | Freq: Every day | CUTANEOUS | Status: DC
  Administered 2018-04-17: 6 via TOPICAL

## 2018-04-16 MED ORDER — FENTANYL CITRATE (PF) 100 MCG/2ML IJ SOLN
INTRAMUSCULAR | Status: AC
  Filled 2018-04-16: qty 2

## 2018-04-16 MED ORDER — PROPOFOL 1000 MG/100ML IV EMUL
INTRAVENOUS | Status: AC
  Filled 2018-04-16: qty 100

## 2018-04-16 MED ORDER — PROPOFOL 100 MG/10ML IV EMUL
50.00 | INTRAVENOUS | Status: DC
Start: ? — End: 2018-04-16

## 2018-04-16 MED ORDER — PROPOFOL 1000 MG/100ML IV EMUL: 0.0000 ug/kg/min | INTRAVENOUS | Status: DC

## 2018-04-16 NOTE — Progress Notes (Signed)
Initial Nutrition Assessment  DOCUMENTATION CODES:   Obesity unspecified  INTERVENTION:    Vital High Protein at 40 ml/h (960 ml per day)  Pro-stat 30 ml QID  Provides 1360 kcal (2258 kcal total with propofol), 144 gm protein, 803 ml free water daily  NUTRITION DIAGNOSIS:   Inadequate oral intake related to inability to eat as evidenced by NPO status.  GOAL:   Patient will meet greater than or equal to 90% of their needs  MONITOR:   Vent status, TF tolerance, Labs  REASON FOR ASSESSMENT:   Ventilator, Consult Enteral/tube feeding initiation and management  ASSESSMENT:   47 yo male with PMH of H pylori gastritis, GERD, Mosaic Klinefelter syndrome, TIA who was admitted on 11/11 with throat swelling, suspected allergic reaction, requiring intubation on admission.  Patient's wife at bedside reports patient was eating well with no nutrition issues PTA. She thinks he had an allergic reaction to shellfish that he ate Sunday at lunch time.   Received MD Consult for TF initiation and management. OGT in place.  Patient is currently intubated on ventilator support MV: 9.5 L/min No data recorded.  Propofol: 34 ml/hr providing 898 kcal from lipids  Labs reviewed. Medications reviewed and include solumedrol, propofol.  Recorded weight is borderline obese, will not use hypo-caloric feeding at this time.   NUTRITION - FOCUSED PHYSICAL EXAM:    Most Recent Value  Orbital Region  No depletion  Upper Arm Region  No depletion  Thoracic and Lumbar Region  Unable to assess  Buccal Region  No depletion  Temple Region  No depletion  Clavicle Bone Region  No depletion  Clavicle and Acromion Bone Region  No depletion  Scapular Bone Region  Unable to assess  Dorsal Hand  No depletion  Patellar Region  No depletion  Anterior Thigh Region  No depletion  Posterior Calf Region  No depletion  Edema (RD Assessment)  None  Hair  Reviewed  Eyes  Unable to assess  Mouth  Unable to  assess  Skin  Reviewed  Nails  Reviewed       Diet Order:   Diet Order            Diet NPO time specified  Diet effective now              EDUCATION NEEDS:   No education needs have been identified at this time  Skin:  Skin Assessment: Reviewed RN Assessment  Last BM:  PTA  Height:   Ht Readings from Last 1 Encounters:  04/16/18 6\' 4"  (1.93 m)    Weight:   Wt Readings from Last 1 Encounters:  04/16/18 113.4 kg    Ideal Body Weight:  91.8 kg  BMI:  Body mass index is 30.43 kg/m.  Estimated Nutritional Needs:   Kcal:  2290  Protein:  130-160 gm  Fluid:  2.3 L    Joaquin Courts, RD, LDN, CNSC Pager [phone] After Hours Pager [phone]

## 2018-04-16 NOTE — Progress Notes (Addendum)
ANTICOAGULATION CONSULT NOTE - Initial Consult   Pharmacy Consult for heparin Indication: lifelong anticoagulation due to recurrent unprovoked VTEs, exacerbated by Klinefelter's    Allergies: Ioxaglate/IVP dye   SOB, dizziness (07/16/2014)  Patient Measurements: Height: 6\' 4"  (1.93 m) Weight: 250 lb (113.4 kg) IBW/kg (Calculated) : 86.8 kg  (108.5 = IBD x 1.25) Heparin Dosing Weight: 110 kg   Vital Signs: Temp:  Temp Source:  BP: 100/58 (04/16/18 1100) Pulse Rate:  62 (04/16/18 1100)   Labs:  Recent Labs (from [facility] ED 04/16/18))  HGB   15.3 HCT   44.9 PLT    175 CREATININE 0.80  INR     0.96 PT      11.0  APTT  33.5 Heparin correlation 0.2   Estimated Creatinine Clearance: >120 mL/min (A) (by C-G formula based on SCr of 0.8 mg/dL (H)).   Medical History: Past Medical History: Klinefelter's syndrome, hx DVT & PE, hx heroin abuse, hx amphetamine abuse  Assessment: 47 yo M presents with angioedema. On Xarelto PTA for lifelong VTE ppx. Last Xarelto dose 04/15/2018 10pm. Last filled 01/31/2018, supply. Transitioning to heparin gtt while in ICU and intubated for VTE ppx, will reassess resumption of rivaroxaban as clinical condition improves. Labs from outside hospital show APTT of 33.5 and heparin level of 0.2, which does not correspond with reported medication last dose.   Goal of Therapy:  Heparin level 0.3-0.7 units/ml APTT 66-102  Monitor platelets by anticoagulation protocol: Yes   Plan:  No heparin bolus  Start heparin gtt at 1750 units/hr at 10pm 04/16/2018 Check APTT, heparin level in 6 hrs Monitor daily heparin level, APTT, CBC, s/s of bleed

## 2018-04-16 NOTE — H&P (Signed)
NAME:  Timothy [name], MRN:  [id number], DOB:  April 06, 1971, LOS: 0 ADMISSION DATE:  04/16/2018, CONSULTATION DATE:  04/16/2018  REFERRING MD:  New admission from [place], CHIEF COMPLAINT:  Angioedema    Brief History   This is a 47 year old gentleman that was camping in a new tent with his daughters.  Developed itching, hives and throat swelling.  Presented to [facility] emergency room.  He was given steroids, Benadryl, Pepcid and epinephrine subcu.  He continued to have throat swelling and the decision was made for endotracheal intubation.  He did have a raised red lesion on his back which is currently no longer present.  There was concern for possible bite or injection from an insect.  However this is not confirmed.  Patient was intubated in the emergency room and transferred here for higher level of care in the intensive care unit.  Per nursing handout and per family there was no recent changes in medications. 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  Additionally has a history of heroin abuse, in remission, on Suboxone.  Patient follows with Dr. Craige Cotta in the sleep clinic due to idiopathic hypersomnia on armodafinil.  Past Medical History   Past Medical History:  Diagnosis Date  . Carpal tunnel syndrome on both sides 03/28/2013  . Chronic headache   . DVT (deep venous thrombosis) (HCC)    2017-2018: 4 dvts  . GERD (gastroesophageal reflux disease)   . Helicobacter pylori gastritis 06/30/06  . 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 Pulmonary embolism (HCC)    2013: 2 total  . TIA (transient ischemic attack)     Significant Hospital Events   04/16/2018: Endotracheal intubation at [facility].  Consults: date of consult/date signed off & final recs:  04/16/2018 pulmonary critical care admission to ICU  Procedures (surgical and bedside):  04/16/2018: Endotracheal intubation, 7.0 ETT, no documentation of whether it was difficult.  Significant Diagnostic  Tests:  04/16/2018: Chest x-ray, appropriate endotracheal tube positioning no infiltrate The patient's images have been independently reviewed by me.    Micro Data:  None  Antimicrobials:  None  Subjective:  Intubated, sedated, mechanically ventilated  Objective   Pulse 60, resp. rate (!) 29, height 6\' 4"  (1.93 m), weight 113.4 kg, SpO2 99 %.    Vent Mode: PRVC FiO2 (%):  [40 %] 40 % Set Rate:  [16 bmp] 16 bmp Vt Set:  [600 mL] 600 mL PEEP:  [5 cmH20] 5 cmH20 Plateau Pressure:  [13 cmH20] 13 cmH20  No intake or output data in the 24 hours ending 04/16/18 1034 Filed Weights   04/16/18 1000  Weight: 113.4 kg    Examination: General: Tall, middle-aged male, no acute distress, intubated on mechanical ventilation HENT: NCAT, sclera clear, pupils reactive Neck: Trachea midline, ET tube in place Lungs: Clear to auscultation bilaterally, bilateral ventilated breath sounds, no crackles, no wheeze Cardiovascular: Regular rate and rhythm, S1-S2 Abdomen: Soft, nontender, nondistended Extremities: Moving all 4 extremities, DP, radial present Neuro: Moving all 4, no focal deficit, appears to have normal muscle strength, off sedation trying to climb out of bed. GU: Foley in place Skin: No evidence of hives or rash at this time.  Resolved Hospital Problem list    Assessment & Plan:   Acute hypoxemic hypercarbic respiratory failure secondary to presumed angioedema and acute allergic reaction, no evidence of anaphylaxis or anaphylactic shock at this time. - Patient has already received Benadryl, Pepcid, subcu epi as well as Solu-Medrol. -We will  continue Solu-Medrol 40 mg IV every 12 - Maintain sedation with RA SS goal 0 to -1. - We will need to ensure that he does not self extubate due to the possibility of laryngeal swelling and ongoing angioedema. - Placed on PRVC, tidal volume 7 cc/kg, will repeat gas this morning.  Make appropriate adjustments  Pain, agitation, delirium  prevention History of heroin abuse, on chronic Suboxone -Started continuous fentanyl, he will likely need higher doses of fentanyl due to tolerance history -Continue propofol for sedation, goal RA SS 0 to -1  History of VTE -Hold Xarelto while in the intensive care unit, pending unknown need for procedure. -Start heparin  History of mosaic Klinefelter's History of idiopathic hypersomnia -Continue to observe, no intervention needed  Disposition / Summary of Today's Plan 04/16/18   ICU Assess for cuff leak and possible liberation from ventilator tomorrow    Diet: Tube feeds started, pep up protocol Pain/Anxiety/Delirium protocol (if indicated): Ordered VAP protocol (if indicated): Yes DVT prophylaxis: Heparin GI prophylaxis: H2 walker Hyperglycemia protocol: None Mobility: Bedrest Code Status: Full Family Communication: We will update family once here.  Labs   CBC: No results for input(s): WBC, NEUTROABS, HGB, HCT, MCV, PLT in the last 168 hours.  Basic Metabolic Panel: No results for input(s): NA, K, CL, CO2, GLUCOSE, BUN, CREATININE, CALCIUM, MG, PHOS in the last 168 hours. GFR: CrCl cannot be calculated (Patient's most recent lab result is older than the maximum 21 days allowed.). No results for input(s): PROCALCITON, WBC, LATICACIDVEN in the last 168 hours.  Liver Function Tests: No results for input(s): AST, ALT, ALKPHOS, BILITOT, PROT, ALBUMIN in the last 168 hours. No results for input(s): LIPASE, AMYLASE in the last 168 hours. No results for input(s): AMMONIA in the last 168 hours.  ABG No results found for: PHART, PCO2ART, PO2ART, HCO3, TCO2, ACIDBASEDEF, O2SAT   Coagulation Profile: No results for input(s): INR, PROTIME in the last 168 hours.  Cardiac Enzymes: No results for input(s): CKTOTAL, CKMB, CKMBINDEX, TROPONINI in the last 168 hours.  HbA1C: Hgb A1c MFr Bld  Date/Time Value Ref Range Status  06/04/2015 09:55 AM 5.3 4.8 - 5.6 % Final    Comment:              Pre-diabetes: 5.7 - 6.4          Diabetes: >6.4          Glycemic control for adults with diabetes: <7.0     CBG: Recent Labs  Lab 04/16/18 1014  GLUCAP 133*    Admitting History of Present Illness.   See above  Review of Systems:   Unable to obtain secondary to critical illness, intubated mechanically ventilated  Past Medical History  He,  has a past medical history of Carpal tunnel syndrome on both sides (03/28/2013), Chronic headache, DVT (deep venous thrombosis) (HCC), GERD (gastroesophageal reflux disease), Helicobacter pylori gastritis (06/30/06), Mosaic Klinefelter syndrome (03/28/2013), Pulmonary embolism (HCC), and TIA (transient ischemic attack).   Surgical History    Past Surgical History:  Procedure Laterality Date  . unremarkable       Social History   Social History   Socioeconomic History  . Marital status: Married    Spouse name: Not on file  . Number of children: 1  . Years of education: Not on file  . Highest education level: Not on file  Occupational History  . Occupation: Full time student  Social Needs  . Financial resource strain: Not on file  . Food insecurity:  Worry: Not on file    Inability: Not on file  . Transportation needs:    Medical: Not on file    Non-medical: Not on file  Tobacco Use  . Smoking status: Never Smoker  . Smokeless tobacco: Former Engineer, water and Sexual Activity  . Alcohol use: No    Alcohol/week: 0.0 standard drinks  . Drug use: Yes    Types: Other-see comments, Oxycodone, Heroin    Comment: Former Use - 12/04/15  . Sexual activity: Not Currently    Partners: Female  Lifestyle  . Physical activity:    Days per week: Not on file    Minutes per session: Not on file  . Stress: Not on file  Relationships  . Social connections:    Talks on phone: Not on file    Gets together: Not on file    Attends religious service: Not on file    Active member of club or organization: Not on file     Attends meetings of clubs or organizations: Not on file    Relationship status: Not on file  . Intimate partner violence:    Fear of current or ex partner: Not on file    Emotionally abused: Not on file    Physically abused: Not on file    Forced sexual activity: Not on file  Other Topics Concern  . Not on file  Social History Narrative   Lives with wife and children in a one story home.  Has 2 children.     Works in Engineering geologist.     Education: college.  ,  reports that he has never smoked. He quit smokeless tobacco use about 6 years ago. He reports that he has current or past drug history. Drugs: Other-see comments, Oxycodone, and Heroin. He reports that he does not drink alcohol.   Family History   His family history includes Allergies in his brother; Barrett's esophagus in his mother; Breast cancer in his mother; Diabetes in his brother, paternal grandfather, and sister; Heart attack in his father; Heart disease in his father, maternal grandfather, maternal grandmother, paternal grandfather, and paternal grandmother; Irritable bowel syndrome in his brother. There is no history of Colon cancer or Esophageal cancer.   Allergies Allergies  Allergen Reactions  . Ioxaglate Shortness Of Breath and Other (See Comments)    dizziness  . Ivp Dye [Iodinated Diagnostic Agents] Shortness Of Breath and Other (See Comments)    dizziness     Home Medications  Prior to Admission medications   Medication Sig Start Date End Date Taking? Authorizing Provider  Armodafinil 150 MG tablet Take 1 tablet (150 mg total) by mouth daily. 01/29/18   Parrett, Virgel Bouquet, NP  omeprazole (PRILOSEC) 40 MG capsule Take 1 capsule (40 mg total) by mouth 2 (two) times daily. 09/17/15   Armbruster, Willaim Rayas, MD  propranolol ER (INDERAL LA) 80 MG 24 hr capsule TAKE ONE CAPSULE EVERY DAY 07/04/16   Nita Sickle K, DO  sertraline (ZOLOFT) 20 MG/ML concentrated solution Take 100 mg by mouth daily.     [provider]    XARELTO 15 MG TABS tablet Take 30 mg by mouth at bedtime. 21 day therapy course patient began on 11/26/15 11/26/15   [provider]     This patient is critically ill with multiple organ system failure; which, requires frequent high complexity decision making, assessment, support, evaluation, and titration of therapies. This was completed through the application of advanced monitoring technologies and extensive interpretation  of multiple databases.  Portions of this time include review of records from [facility]. During this encounter critical care time was devoted to patient care services described in this note for 42 minutes.   Josephine Igo, DO  Pulmonary Critical Care 04/16/2018 10:40 AM  Personal pager: [phone] If unanswered, please page CCM On-call: [phone]

## 2018-04-17 ENCOUNTER — Encounter (HOSPITAL_COMMUNITY): Payer: Self-pay | Admitting: *Deleted

## 2018-04-17 ENCOUNTER — Other Ambulatory Visit: Payer: Self-pay

## 2018-04-17 LAB — BASIC METABOLIC PANEL
Anion gap: 7 (ref 5–15)
BUN: 20 mg/dL (ref 6–20)
CALCIUM: 8.8 mg/dL — AB (ref 8.9–10.3)
CHLORIDE: 110 mmol/L (ref 98–111)
CO2: 23 mmol/L (ref 22–32)
Creatinine, Ser: 0.88 mg/dL (ref 0.61–1.24)
GFR calc Af Amer: >60 mL/min (ref >60)
GFR calc non Af Amer: >60 mL/min (ref >60)
GLUCOSE: 184 mg/dL — AB (ref 70–99)
POTASSIUM: 4 mmol/L (ref 3.5–5.1)
Sodium: 140 mmol/L (ref 135–145)

## 2018-04-17 LAB — POCT I-STAT 3, ART BLOOD GAS (G3+)
Acid-base deficit: 1 mmol/L (ref 0.0–2.0)
Bicarbonate: 24 mmol/L (ref 20.0–28.0)
O2 Saturation: 96 %
PCO2 ART: 40.2 mmHg (ref 32.0–48.0)
PH ART: 7.384 (ref 7.350–7.450)
TCO2: 25 mmol/L (ref 22–32)
pO2, Arterial: 84 mmHg (ref 83.0–108.0)

## 2018-04-17 LAB — CBC
HEMATOCRIT: 40 % (ref 39.0–52.0)
HEMOGLOBIN: 12.7 g/dL — AB (ref 13.0–17.0)
MCH: 29.7 pg (ref 26.0–34.0)
MCHC: 31.8 g/dL (ref 30.0–36.0)
MCV: 93.5 fL (ref 80.0–100.0)
NRBC: 0 % (ref 0.0–0.2)
Platelets: 148 10*3/uL — ABNORMAL LOW (ref 150–400)
RBC: 4.28 MIL/uL (ref 4.22–5.81)
RDW: 14.2 % (ref 11.5–15.5)
WBC: 9.1 10*3/uL (ref 4.0–10.5)

## 2018-04-17 LAB — GLUCOSE, CAPILLARY
GLUCOSE-CAPILLARY: 168 mg/dL — AB (ref 70–99)
GLUCOSE-CAPILLARY: 158 mg/dL — AB (ref 70–99)
Glucose-Capillary: 160 mg/dL — ABNORMAL HIGH (ref 70–99)

## 2018-04-17 LAB — HIV ANTIBODY (ROUTINE TESTING W REFLEX): HIV SCREEN 4TH GENERATION: NONREACTIVE

## 2018-04-17 LAB — PHOSPHORUS: PHOSPHORUS: 3.5 mg/dL (ref 2.5–4.6)

## 2018-04-17 LAB — APTT: APTT: 97 s — AB (ref 24–36)

## 2018-04-17 LAB — HEPARIN LEVEL (UNFRACTIONATED): HEPARIN UNFRACTIONATED: 0.6 [IU]/mL (ref 0.30–0.70)

## 2018-04-17 LAB — MAGNESIUM: Magnesium: 2 mg/dL (ref 1.7–2.4)

## 2018-04-17 MED ORDER — PANTOPRAZOLE SODIUM 40 MG IV SOLR: 40.0000 mg | INTRAVENOUS | Status: DC

## 2018-04-17 MED ORDER — ALBUTEROL SULFATE (2.5 MG/3ML) 0.083% IN NEBU: 2.5000 mg | INHALATION_SOLUTION | RESPIRATORY_TRACT | Status: DC | PRN

## 2018-04-17 MED ORDER — BUPRENORPHINE HCL-NALOXONE HCL 8-2 MG SL SUBL
1.0000 | SUBLINGUAL_TABLET | Freq: Every day | SUBLINGUAL | Status: DC
  Administered 2018-04-17 – 2018-04-18 (×2): 1 via SUBLINGUAL
  Filled 2018-04-17 (×2): qty 1

## 2018-04-17 MED ORDER — PREDNISONE 20 MG PO TABS
20.0000 mg | ORAL_TABLET | Freq: Every day | ORAL | Status: DC
  Administered 2018-04-18: 20 mg via ORAL
  Filled 2018-04-17 (×2): qty 1

## 2018-04-17 MED ORDER — RIVAROXABAN 20 MG PO TABS
20.0000 mg | ORAL_TABLET | Freq: Every day | ORAL | Status: DC
  Administered 2018-04-17: 20 mg via ORAL
  Filled 2018-04-17: qty 1

## 2018-04-17 MED ORDER — PANTOPRAZOLE SODIUM 40 MG PO TBEC
40.0000 mg | DELAYED_RELEASE_TABLET | Freq: Every day | ORAL | Status: DC
  Administered 2018-04-17 – 2018-04-18 (×2): 40 mg via ORAL
  Filled 2018-04-17 (×2): qty 1

## 2018-04-17 MED ORDER — SERTRALINE HCL 100 MG PO TABS
100.0000 mg | ORAL_TABLET | Freq: Every day | ORAL | Status: DC
  Administered 2018-04-17 – 2018-04-18 (×2): 100 mg via ORAL
  Filled 2018-04-17 (×2): qty 1

## 2018-04-17 NOTE — Procedures (Signed)
Extubation Procedure Note  Patient Details:   Name: Timothy [name] DOB: [birth year] MRN: [id number]   Airway Documentation:    Vent end date: 04/17/18 Vent end time: 1038   Evaluation  O2 sats: stable throughout Complications: No apparent complications Patient did tolerate procedure well. Bilateral Breath Sounds: Clear, Diminished   Yes   RT extubated patient to 3L [place] per MD order with RN at bedside. Positive cuff leak noted. Patient tolerated well and no stridor noted. Patient currently sating 98% on 3L and is in no distress. RT will continue to monitor as needed.  Lura Em 04/17/2018, 10:45 AM

## 2018-04-17 NOTE — Progress Notes (Signed)
Pt arrived from [facility]. Pt is A&Ox4. Pt's skin is warm, dry and intact. Pt oriented to room. Pt lives at home. Pt told to call for assistance, pt stated understanding. Will continue to monitor pt. Nelda MarseilleJenny Thacker, RN

## 2018-04-17 NOTE — Progress Notes (Signed)
SLP Cancellation Note  Patient Details Name: Timothy ChimesJason L [name] MRN: [id number] DOB: 30-Oct-1970   Cancelled treatment:       Reason Eval/Treat Not Completed: Other (comment)  Swallowing evaluation ordered but per discussion with RN and MD, no longer indicated. Will sign off - please reorder if needed.   Maxcine Hamaiewonsky, Laterrica Libman 04/17/2018, 2:56 PM  Maxcine HamLaura Paiewonsky, M.A. CCC-SLP Acute Herbalistehabilitation Services Pager [phone] Office [phone]

## 2018-04-17 NOTE — Progress Notes (Signed)
ANTICOAGULATION CONSULT NOTE   Pharmacy Consult for Heparin Indication: h/o PE  Allergies  Allergen Reactions  . Ioxaglate Shortness Of Breath and Other (See Comments)    dizziness  . Ivp Dye [Iodinated Diagnostic Agents] Shortness Of Breath and Other (See Comments)    dizziness    Patient Measurements: Height: 6\' 4"  (193 cm) Weight: 276 lb 3.8 oz (125.3 kg) IBW/kg (Calculated) : 86.8 Heparin Dosing Weight: 110 kg  Vital Signs: Temp: 99 F (37.2 C) (11/12 0328) Temp Source: Axillary (11/12 0328) BP: 105/52 (11/12 0600) Pulse Rate: 65 (11/12 0600)  Labs: Recent Labs    04/16/18 1053 04/16/18 1633 04/16/18 2222 04/17/18 0409 04/17/18 0618  HGB 14.2  --   --  12.7*  --   HCT 44.6  --   --  40.0  --   PLT 174  --   --  148*  --   APTT 30  --   --   --  97*  LABPROT 14.1  --   --   --   --   INR 1.10  --   --   --   --   HEPARINUNFRC  --   --   --   --  0.60  CREATININE 1.02  --   --  0.88  --   TROPONINI 0.03* <0.03 <0.03  --   --     Estimated Creatinine Clearance: 151.6 mL/min (by C-G formula based on SCr of 0.88 mg/dL).  Assessment: 47 y.o. male with h/o PE for heparin.  Heparin levels and aPTTs correlating, will utilize heparin levels alone   Goal of Therapy:  Heparin level 0.3-0.7 units/ml Monitor platelets by anticoagulation protocol: Yes   Plan:  Continue Heparin at current rate   Tachina Spoonemore, [employer] 04/17/2018,7:33 AM

## 2018-04-17 NOTE — Progress Notes (Signed)
ANTICOAGULATION CONSULT NOTE - Follow Up Consult  Pharmacy Consult for Heparin to Rivaroxaban transition Indication: lifelong anticoagulation due to recurrent unprovoked VTEs, exacerbated by Klinefelter's   Allergies  Allergen Reactions  . Ioxaglate Shortness Of Breath and Other (See Comments)    dizziness  . Ivp Dye [Iodinated Diagnostic Agents] Shortness Of Breath and Other (See Comments)    dizziness    Patient Measurements: Height: 6\' 4"  (193 cm) Weight: 276 lb 3.8 oz (125.3 kg) IBW/kg (Calculated) : 86.8 Heparin Dosing Weight: 110 kg  Vital Signs: Temp: 98.6 F (37 C) (11/12 0737) Temp Source: Oral (11/12 0737) BP: 117/54 (11/12 1100) Pulse Rate: 66 (11/12 1100)  Labs: Recent Labs    04/16/18 1053 04/16/18 1633 04/16/18 2222 04/17/18 0409 04/17/18 0618  HGB 14.2  --   --  12.7*  --   HCT 44.6  --   --  40.0  --   PLT 174  --   --  148*  --   APTT 30  --   --   --  97*  LABPROT 14.1  --   --   --   --   INR 1.10  --   --   --   --   HEPARINUNFRC  --   --   --   --  0.60  CREATININE 1.02  --   --  0.88  --   TROPONINI 0.03* <0.03 <0.03  --   --     Estimated Creatinine Clearance: 151.6 mL/min (by C-G formula based on SCr of 0.88 mg/dL).   Medications:  Medications Prior to Admission  Medication Sig Dispense Refill Last Dose  . buprenorphine-naloxone (SUBOXONE) 8-2 mg SUBL SL tablet Place 1 tablet under the tongue daily.     Marland Kitchen. omeprazole (PRILOSEC) 40 MG capsule Take 1 capsule (40 mg total) by mouth 2 (two) times daily. 60 capsule 1 Taking  . propranolol ER (INDERAL LA) 80 MG 24 hr capsule TAKE ONE CAPSULE EVERY DAY 30 capsule 0 04/15/2018 at Unknown time  . rivaroxaban (XARELTO) 20 MG TABS tablet Take 20 mg by mouth daily with supper. Pharmacy states last filled August 2019 for one month supply.     . sertraline (ZOLOFT) 20 MG/ML concentrated solution Take 100 mg by mouth daily.    Past Week at Unknown time  . traZODone (DESYREL) 50 MG tablet Take 50 mg by  mouth at bedtime.   Past Week at Unknown time    Assessment: Patient was at therapeutic goal for heparin when assessed [phone] by pharmacy. Patient successfully extubated 11/12 1045, and should be cleared for oral meds at 1445. Patient cleared to resume oral meds, and IV to PO transition for anticoagulation therapy.  Goal of Therapy:  aPTT 66-102 seconds Monitor platelets by anticoagulation protocol: Yes    Plan:  Cancelled heparin level for 11/12 1200. Discontinue heparin gtt when resume rivaroxaban. Start rivaroxaban 20mg  PO daily.  Quang Thorpe 04/17/2018,11:26 AM

## 2018-04-17 NOTE — Progress Notes (Signed)
[place] from [place] on [place].

## 2018-04-17 NOTE — Progress Notes (Addendum)
NAME:  Timothy [name], MRN:  [id number], DOB:  May 20, 1971, LOS: 1 ADMISSION DATE:  04/16/2018, CONSULTATION DATE:  11/11 REFERRING MD: Montine Circle  , CHIEF COMPLAINT:  angioedema  Brief History   47 year old who presents 2/2 angioedema. Intubated at that time. History of present illness   This is a 47 year old gentleman that was camping in a new tent with his daughters.  Developed itching, hives and throat swelling.  Presented to [facility] emergency room.  He was given steroids, Benadryl, Pepcid and epinephrine subcu.  He continued to have throat swelling and the decision was made for endotracheal intubation.  He did have a raised red lesion on his back which is currently no longer present.  There was concern for possible bite or injection from an insect.  However this is not confirmed.  Patient was intubated in the emergency room and transferred here for higher level of care in the intensive care unit.  Per nursing handout and per family there was no recent changes in medications. 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  Additionally has a history of heroin abuse, in remission, on Suboxone. Past Medical History  Bilateral carpal tunnel, chronic headaches, h/o dvt, gerd, h.pylori, klinefelter syndrome, PE, TIA Significant Hospital Events   04/16/2018: Endotracheal intubation at [facility]. 04/17/2018: extubated  Consults:  04/16/2018 pulmonary critical care admission to ICU  Procedures:  04/16/2018: Endotracheal intubation, 7.0 ETT, no documentation of whether it was difficult.  Significant Diagnostic Tests:  04/16/2018: Chest x-ray, appropriate endotracheal tube positioning no infiltrate  Micro Data:  none  Antimicrobials:  none  Objective   Blood pressure (!) 105/52, pulse 65, temperature 98.6 F (37 C), temperature source Oral, resp. rate 16, height 6\' 4"  (1.93 m), weight 125.3 kg, SpO2 98 %.    Vent Mode: CPAP;PSV FiO2 (%):  [40 %] 40 % Set Rate:  [16  bmp] 16 bmp Vt Set:  [600 mL] 600 mL PEEP:  [5 cmH20] 5 cmH20 Pressure Support:  [5 cmH20] 5 cmH20 Plateau Pressure:  [13 cmH20-20 cmH20] 20 cmH20   Intake/Output Summary (Last 24 hours) at 04/17/2018 0833 Last data filed at 04/17/2018 0600 Gross per 24 hour  Intake 1531.44 ml  Output 1375 ml  Net 156.44 ml   Filed Weights   04/16/18 1000 04/17/18 0400  Weight: 113.4 kg 125.3 kg    Examination: General: well appearing 47 year old male. Intubated. Awake, alert, follows commands HENT: ETT in place, [place]/At Lungs: lungs CTAB, intubated, comfortable Cardiovascular: rrr, no m/r/g. Palpable pt/dp bilaterally Abdomen: soft, non-tender, non-distended Extremities: able to move all extremities Neuro: GCS 11T, able to follow commands, moves all extremitiies, eyes open spontaneously GU: foley catheter in place  Resolved Hospital Problem list     Assessment & Plan:  47 year old who presents with angioedema of unknown origin after camping. Did not take any new meds, possible insect bite.  Acute hypoxic respiratory failure Presumed angioedema - extubation trial 11/12 - Solumedrol 40mg  IV q 12 hours - stop sedation if extubation successful  History of heroin abuse, on chronic suboxone - restart home suboxone when tolerating PO - stop fentanyl and propofol for extubation trial  History of VTE - hold xarelto - heparin gtt   Best practice:  Diet: NPO Pain/Anxiety/Delirium protocol (if indicated): fentanyl VAP protocol (if indicated): in place DVT prophylaxis: hep gtt GI prophylaxis: pepcid Glucose control: nothing in place Mobility: bedrest Code Status: full Family Communication: none at bedside Disposition: likely  out to floor if extubation successful  Labs   CBC: Recent Labs  Lab 04/16/18 1053 04/17/18 0409  WBC 9.1 9.1  NEUTROABS 8.4*  --   HGB 14.2 12.7*  HCT 44.6 40.0  MCV 93.9 93.5  PLT 174 148*    Basic Metabolic Panel: Recent Labs  Lab 04/16/18 1053  04/17/18 0409  NA 142 140  K 4.2 4.0  CL 112* 110  CO2 21* 23  GLUCOSE 165* 184*  BUN 18 20  CREATININE 1.02 0.88  CALCIUM 8.8* 8.8*  MG 1.9 2.0  PHOS 2.8 3.5   GFR: Estimated Creatinine Clearance: 151.6 mL/min (by C-G formula based on SCr of 0.88 mg/dL). Recent Labs  Lab 04/16/18 1053 04/16/18 1401 04/17/18 0409  WBC 9.1  --  9.1  LATICACIDVEN 1.8 1.2  --     Liver Function Tests: Recent Labs  Lab 04/16/18 1053  AST 24  ALT 18  ALKPHOS 73  BILITOT 0.6  PROT 6.5  ALBUMIN 3.8   Recent Labs  Lab 04/16/18 1053  LIPASE 23  AMYLASE 35   No results for input(s): AMMONIA in the last 168 hours.  ABG    Component Value Date/Time   PHART 7.384 04/17/2018 0329   PCO2ART 40.2 04/17/2018 0329   PO2ART 84.0 04/17/2018 0329   HCO3 24.0 04/17/2018 0329   TCO2 25 04/17/2018 0329   ACIDBASEDEF 1.0 04/17/2018 0329   O2SAT 96.0 04/17/2018 0329     Coagulation Profile: Recent Labs  Lab 04/16/18 1053  INR 1.10    Cardiac Enzymes: Recent Labs  Lab 04/16/18 1053 04/16/18 1633 04/16/18 2222  TROPONINI 0.03* <0.03 <0.03    HbA1C: Hgb A1c MFr Bld  Date/Time Value Ref Range Status  06/04/2015 09:55 AM 5.3 4.8 - 5.6 % Final    Comment:             Pre-diabetes: 5.7 - 6.4          Diabetes: >6.4          Glycemic control for adults with diabetes: <7.0     CBG: Recent Labs  Lab 04/16/18 1503 04/16/18 2013 04/16/18 2349 04/17/18 0448 04/17/18 0735  GLUCAP 158* 156* 156* 168* 160*    Review of Systems:   intubated  Past Medical History  He,  has a past medical history of Carpal tunnel syndrome on both sides (03/28/2013), Chronic headache, DVT (deep venous thrombosis) (HCC), GERD (gastroesophageal reflux disease), Helicobacter pylori gastritis (06/30/06), Mosaic Klinefelter syndrome (03/28/2013), Pulmonary embolism (HCC), and TIA (transient ischemic attack).   Surgical History    Past Surgical History:  Procedure Laterality Date  . unremarkable        Social History   reports that he has never smoked. He quit smokeless tobacco use about 6 years ago. He reports that he has current or past drug history. Drugs: Other-see comments, Oxycodone, and Heroin. He reports that he does not drink alcohol.   Family History   His family history includes Allergies in his brother; Barrett's esophagus in his mother; Breast cancer in his mother; Diabetes in his brother, paternal grandfather, and sister; Heart attack in his father; Heart disease in his father, maternal grandfather, maternal grandmother, paternal grandfather, and paternal grandmother; Irritable bowel syndrome in his brother. There is no history of Colon cancer or Esophageal cancer.   Allergies Allergies  Allergen Reactions  . Ioxaglate Shortness Of Breath and Other (See Comments)    dizziness  . Ivp Dye [Iodinated Diagnostic Agents] Shortness Of Breath  and Other (See Comments)    dizziness     Home Medications  Prior to Admission medications   Medication Sig Start Date End Date Taking? Authorizing Provider  buprenorphine-naloxone (SUBOXONE) 8-2 mg SUBL SL tablet Place 1 tablet under the tongue daily.   Yes [provider]  omeprazole (PRILOSEC) 40 MG capsule Take 1 capsule (40 mg total) by mouth 2 (two) times daily. 09/17/15  Yes Armbruster, Willaim Rayas, MD  propranolol ER (INDERAL LA) 80 MG 24 hr capsule TAKE ONE CAPSULE EVERY DAY 07/04/16  Yes Patel, Donika K, DO  rivaroxaban (XARELTO) 20 MG TABS tablet Take 20 mg by mouth daily with supper. Pharmacy states last filled August 2019 for one month supply.   Yes [provider]  sertraline (ZOLOFT) 20 MG/ML concentrated solution Take 100 mg by mouth daily.    Yes [provider]  traZODone (DESYREL) 50 MG tablet Take 50 mg by mouth at bedtime.   Yes [provider]     Critical care time:

## 2018-04-18 DIAGNOSIS — T783XXA Angioneurotic edema, initial encounter: Secondary | ICD-10-CM

## 2018-04-18 MED ORDER — PREDNISONE 20 MG PO TABS: 20.0000 mg | ORAL_TABLET | Freq: Every day | ORAL | 0 refills | Status: DC

## 2018-04-18 MED ORDER — EPINEPHRINE 0.3 MG/0.3ML IJ SOAJ: 0.3000 mg | Freq: Once | INTRAMUSCULAR | 0 refills | Status: AC

## 2018-04-18 MED FILL — EPINEPHRINE 0.3 MG AUTO-INJ: 0.3 | 2 days supply | Qty: 2 | Fill #0

## 2018-04-18 MED FILL — predniSONE 20 MG TABS: 20 | 1 days supply | Qty: 1 | Fill #0

## 2018-04-18 NOTE — Plan of Care (Signed)
Nsg Discharge Note  Admit Date:  04/16/2018 Discharge date: 04/18/2018   Timothy [name] to be D/C'd Home per MD order.  AVS completed.  Copy for chart, and copy for patient signed, and dated. Patient/caregiver able to verbalize understanding.  Discharge Medication: Allergies as of 04/18/2018       Reactions   Ioxaglate Shortness Of Breath, Other (See Comments)   dizziness   Ivp Dye [iodinated Diagnostic Agents] Shortness Of Breath, Other (See Comments)   dizziness   Shellfish Allergy         Medication List     TAKE these medications    buprenorphine-naloxone 8-2 mg Subl SL tablet Commonly known as:  SUBOXONE Place 1 tablet under the tongue daily.   EPINEPHrine 0.3 mg/0.3 mL Soaj injection Commonly known as:  EPI-PEN Inject 0.3 mLs (0.3 mg total) into the muscle once for 1 dose.   omeprazole 40 MG capsule Commonly known as:  PRILOSEC Take 1 capsule (40 mg total) by mouth 2 (two) times daily.   predniSONE 20 MG tablet Commonly known as:  DELTASONE Take 1 tablet (20 mg total) by mouth daily with breakfast. Start taking on:  04/19/2018   propranolol ER 80 MG 24 hr capsule Commonly known as:  INDERAL LA TAKE ONE CAPSULE EVERY DAY   rivaroxaban 20 MG Tabs tablet Commonly known as:  XARELTO Take 20 mg by mouth daily with supper. Pharmacy states last filled August 2019 for one month supply.   sertraline 20 MG/ML concentrated solution Commonly known as:  ZOLOFT Take 100 mg by mouth daily.   traZODone 50 MG tablet Commonly known as:  DESYREL Take 50 mg by mouth at bedtime.        Discharge Assessment: Vitals:   04/17/18 2144 04/18/18 0626  BP: 139/73 128/73  Pulse: 62 61  Resp: 16 12  Temp: 98.4 F (36.9 C) (!) 97.4 F (36.3 C)  SpO2: 98% 95%   Skin clean, dry and intact without evidence of skin break down, no evidence of skin tears noted. IV catheter discontinued intact. Site without signs and symptoms of complications - no redness or edema noted at  insertion site, patient denies c/o pain - only slight tenderness at site.  Dressing with slight pressure applied.  D/c Instructions-Education: Discharge instructions given to patient/family with verbalized understanding. D/c education completed with patient/family including follow up instructions, medication list, d/c activities limitations if indicated, with other d/c instructions as indicated by MD - patient able to verbalize understanding, all questions fully answered. Patient instructed to return to ED, call 911, or call MD for any changes in condition.  Patient escorted via WC, and D/C home via private auto.  Timothy FlamingVicki L Marranda Arakelian, RN 04/18/2018 1:29 PM

## 2018-04-18 NOTE — Evaluation (Signed)
Occupational Therapy Evaluation Patient Details Name: Timothy ChimesJason L Spurlock MRN: [id number] DOB: 06-19-1970 Today's Date: 04/18/2018    History of Present Illness 47 year old gentleman that was camping in a new tent with his daughters.  Developed itching, hives and throat swelling.  [facility]  He was given steroids, Benadryl, Pepcid and epinephrine subcu.  He continued to have throat swelling necessitating endotracheal intubation on 04/16/18 and [facility], extubated 04/17/18. Bilateral carpal tunnel, chronic headaches, h/o dvt, gerd, h.pylori, klinefelter syndrome, PE, TIA.  Additionally has a history of heroin abuse, in remission, on Suboxone.   Clinical Impression   Pt admitted with the above diagnoses and presents with below problem list. PTA pt was independent with ADLs. Pt is currently close to baseline with ADLs, supervision for household distance functional mobility this session. Encouraged pt to be OOB sitting up in recliner as much as possible during the day and discussed some general BUE exercises to help increase overall strength. Pt reporting post-extubation soreness. No further OT needs indicated at this time. OT signing off.      Follow Up Recommendations  No OT follow up    Equipment Recommendations  None recommended by OT    Recommendations for Other Services       Precautions / Restrictions Precautions Precautions: None Restrictions Weight Bearing Restrictions: No      Mobility Bed Mobility Overal bed mobility: Independent                Transfers Overall transfer level: Needs assistance Equipment used: None Transfers: Sit to/from Stand Sit to Stand: Supervision              Balance Overall balance assessment: Mild deficits observed, not formally tested(tends to walk with wide BOS, hips in external rotation)                                         ADL either performed or assessed with clinical judgement    ADL Overall ADL's : Needs assistance/impaired Eating/Feeding: Set up;Sitting   Grooming: Supervision/safety;Standing;Sitting   Upper Body Bathing: Set up;Sitting   Lower Body Bathing: Supervison/ safety;Sit to/from stand   Upper Body Dressing : Set up;Sitting   Lower Body Dressing: Supervision/safety;Sit to/from stand   Toilet Transfer: Supervision/safety;Ambulation   Toileting- Clothing Manipulation and Hygiene: Supervision/safety   Tub/ Shower Transfer: Supervision/safety   Functional mobility during ADLs: Supervision/safety General ADL Comments: Pt completed bed mobility and household distance ambulation. Encouraged to be OOB in chair this morning     Vision         Perception     Praxis      Pertinent Vitals/Pain Pain Assessment: Faces Faces Pain Scale: Hurts little more Pain Location: post-extubation throat and chest soreness Pain Descriptors / Indicators: Sore Pain Intervention(s): Monitored during session     Hand Dominance     Extremity/Trunk Assessment Upper Extremity Assessment Upper Extremity Assessment: Overall WFL for tasks assessed   Lower Extremity Assessment Lower Extremity Assessment: Defer to PT evaluation       Communication Communication Communication: No difficulties   Cognition Arousal/Alertness: Awake/alert Behavior During Therapy: WFL for tasks assessed/performed Overall Cognitive Status: Within Functional Limits for tasks assessed  General Comments       Exercises     Shoulder Instructions      Home Living Family/patient expects to be discharged to:: Private residence Living Arrangements: Spouse/significant other;Children Available Help at Discharge: Family Type of Home: House Home Access: Level entry     Home Layout: One level     Bathroom Shower/Tub: Tub/shower unit         Home Equipment: None   Additional Comments: Pt reports he goes between parents  home (mom has dementia) and house with wife and kids. Plans to d/c to home to residence with wife and kids.      Prior Functioning/Environment Level of Independence: Independent                 OT Problem List:        OT Treatment/Interventions:      OT Goals(Current goals can be found in the care plan section) Acute Rehab OT Goals Patient Stated Goal: home  OT Frequency:     Barriers to D/C:            Co-evaluation              AM-PAC PT "6 Clicks" Daily Activity     Outcome Measure Help from another person eating meals?: None Help from another person taking care of personal grooming?: None Help from another person toileting, which includes using toliet, bedpan, or urinal?: None Help from another person bathing (including washing, rinsing, drying)?: None Help from another person to put on and taking off regular upper body clothing?: None Help from another person to put on and taking off regular lower body clothing?: None 6 Click Score: 24   End of Session    Activity Tolerance: Patient tolerated treatment well Patient left: in chair;with call bell/phone within reach  OT Visit Diagnosis: Unsteadiness on feet (R26.81);Pain                Time: 1000-1012 OT Time Calculation (min): 12 min Charges:  OT General Charges $OT Visit: 1 Visit OT Evaluation $OT Eval Low Complexity: 1 Low  Raynald Kemp, OT Acute Rehabilitation Services Pager: [phone] Office: [phone]   Pilar Grammes 04/18/2018, 10:32 AM

## 2018-04-18 NOTE — Discharge Summary (Signed)
[facility]  Patient name: Timothy ChimesJason L Deike Medical record number: [id number] Date of birth: 01-10-1971 Age: 47 y.o. Gender: male Date of Admission: 04/16/2018  Date of Discharge: 04/18/2018 Admitting Physician: Josephine IgoBradley L Icard, DO  Primary Care Provider: Barron AlvineGibson, David, MD Consultants: None  Indication for Hospitalization: Anaphylaxis  Discharge Diagnoses/Problem List:  Acute hypoxic respiratory failure Angioedema Allergic reaction History of heroin abuse History of VTE  Disposition: home  Discharge Condition: stable  Discharge Exam: Gen: 47 year old male, no acute distress, resting comfortable HEENT: no swelling or edema noted on oropharynx. Lungs: LCTAB, no accessory muscle use, no distress Cards: RRR, No m/r/g Abdomen: soft, non-tender, non-distended Neuro: cn 2-2 intact, no focal deficits  Brief Hospital Course:  47 year old male who presented on 04/16/2018 due to throat swelling.  Patient was out camping with his sons and daughter and developed progressive throat swelling and shortness of breath.  [facility] where he was [facility]ated.  He was given steroids, Benadryl, Pepcid, and epinephrine cutaneously.  He was then transferred to medicine For further management.  Patient did well on the vent and was able to be weaned off and extubated on 04/17/2018.  He did not develop any refractory throat swelling, labial edema, or shortness of breath.  After extubation he was transitioned from IV Solu-Medrol to oral prednisone.  He was discharged with epinephrine pen and hand.  To complete 1 more day 20 mg present daily.  A referral was made to allergy for allergy testing.  Patient has a history of chronic DVTs and was on Xarelto.  This was held while intubated due to the possibility for more procedures.  He was on a heparin drip for DVT prophylaxis.  Xarelto restarted after extubation.  Issues for Follow Up:   1. Ensure patient follows up with allergy for testing. 2. Clarify need for Xarelto for history of chronic DVTs  Significant Procedures: Endotracheal intubation 11/10  Significant Labs and Imaging:  Recent Labs  Lab 04/16/18 1053 04/17/18 0409  WBC 9.1 9.1  HGB 14.2 12.7*  HCT 44.6 40.0  PLT 174 148*   Recent Labs  Lab 04/16/18 1053 04/17/18 0409  NA 142 140  K 4.2 4.0  CL 112* 110  CO2 21* 23  GLUCOSE 165* 184*  BUN 18 20  CREATININE 1.02 0.88  CALCIUM 8.8* 8.8*  MG 1.9 2.0  PHOS 2.8 3.5  ALKPHOS 73  --   AST 24  --   ALT 18  --   ALBUMIN 3.8  --     Results/Tests Pending at Time of Discharge:  Discharge Medications:  Allergies as of 04/18/2018      Reactions   Ioxaglate Shortness Of Breath, Other (See Comments)   dizziness   Ivp Dye [iodinated Diagnostic Agents] Shortness Of Breath, Other (See Comments)   dizziness   Shellfish Allergy       Medication List    TAKE these medications   buprenorphine-naloxone 8-2 mg Subl SL tablet Commonly known as:  SUBOXONE Place 1 tablet under the tongue daily.   EPINEPHrine 0.3 mg/0.3 mL Soaj injection Commonly known as:  EPI-PEN Inject 0.3 mLs (0.3 mg total) into the muscle once for 1 dose.   omeprazole 40 MG capsule Commonly known as:  PRILOSEC Take 1 capsule (40 mg total) by mouth 2 (two) times daily.   propranolol ER 80 MG 24 hr capsule Commonly known as:  INDERAL LA TAKE ONE CAPSULE EVERY  DAY   rivaroxaban 20 MG Tabs tablet Commonly known as:  XARELTO Take 20 mg by mouth daily with supper. Pharmacy states last filled August 2019 for one month supply.   sertraline 20 MG/ML concentrated solution Commonly known as:  ZOLOFT Take 100 mg by mouth daily.   traZODone 50 MG tablet Commonly known as:  DESYREL Take 50 mg by mouth at bedtime.       Discharge Instructions: Please refer to Patient Instructions section of EMR for full details.  Patient was counseled important signs and symptoms that should  prompt return to medical care, changes in medications, dietary instructions, activity restrictions, and follow up appointments.   Follow-Up Appointments:   Myrene Buddy, MD 04/18/2018, 11:46 AM PGY-2, [facility]
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
## 2018-04-18 NOTE — Progress Notes (Addendum)
Nutrition Follow-up  DOCUMENTATION CODES:   Obesity unspecified  INTERVENTION:  -Snacks between meals    NUTRITION DIAGNOSIS:   Inadequate oral intake related to inability to eat as evidenced by NPO status.  Resolved - pt now on regular diet   GOAL:   Patient will meet greater than or equal to 90% of their needs  Progressing - Pt eating 75% of meals currently  MONITOR:   PO intake, Labs, Weight trends    ASSESSMENT:   47 yo male with PMH of H pylori gastritis, GERD, Mosaic Klinefelter syndrome, TIA who was admitted on 11/11 with throat swelling, suspected allergic reaction, requiring intubation on admission.  Pt extubated 11/12 at 1038.   Suspected allergic reaction now believed to be spider/insect bite; per previous RD note wife thought it was shellfish allergic reaction   Visited pt in room today. Pt reports that his appetite is currently good, and appetite was good PTA.   Pt lives at home with wife and children. Pt has to travel often for work and eats a lot of fast food (says "I eat a lot of stuff I shouldn't eat"). He denies decreased intake or unintentional wt loss. He denies trouble chewing or swallowing, denies N/V or diarrhea or constipation.   Pt has high estimated needs for calories and protein. Pt doesn't like oral nutrition supplements but is amenable to receiving snacks to meet needs during hospitalization.   Plan is for discharge 11/13.  Meds: prednisone, Protonix, pepcid Labs: CBGs 156-168   Diet Order:   Diet Order            Diet regular Room service appropriate? Yes; Fluid consistency: Thin  Diet effective now              EDUCATION NEEDS:   No education needs have been identified at this time  Skin:  Skin Assessment: Reviewed RN Assessment  Last BM:  11/10  Height:   Ht Readings from Last 1 Encounters:  04/17/18 6\' 5"  (1.956 m)    Weight:   Wt Readings from Last 1 Encounters:  04/18/18 124.5 kg    Ideal Body Weight:  91.8  kg  BMI:  Body mass index is 32.55 kg/m.  Estimated Nutritional Needs:   Kcal:  2600-2900  Protein:  130-145 gm  Fluid:  >/=2.6 L   Sherry Rogus, Dietetic Intern [phone]

## 2018-04-19 ENCOUNTER — Ambulatory Visit (INDEPENDENT_AMBULATORY_CARE_PROVIDER_SITE_OTHER): Payer: 59 | Admitting: Allergy and Immunology

## 2018-04-19 ENCOUNTER — Encounter: Payer: Self-pay | Admitting: Allergy and Immunology

## 2018-04-19 VITALS — BP 144/88 | HR 60 | Temp 98.5°F | Resp 14 | Ht 73.8 in | Wt 271.0 lb

## 2018-04-19 DIAGNOSIS — T782XXD Anaphylactic shock, unspecified, subsequent encounter: Secondary | ICD-10-CM | POA: Diagnosis not present

## 2018-04-19 DIAGNOSIS — T783XXD Angioneurotic edema, subsequent encounter: Secondary | ICD-10-CM

## 2018-04-19 MED ORDER — FAMOTIDINE 40 MG PO TABS: 40.0000 mg | ORAL_TABLET | Freq: Every day | ORAL | 5 refills | Status: DC

## 2018-04-19 MED ORDER — AUVI-Q 0.3 MG/0.3ML IJ SOAJ: INTRAMUSCULAR | 3 refills | Status: DC

## 2018-04-19 MED FILL — ZUBSOLV 5.7-1.4 MG TAB SL: 5.7-1.4 | 6 days supply | Qty: 12 | Fill #0

## 2018-04-19 NOTE — Progress Notes (Signed)
Dear Dr. Kendrick Fries,  Thank you for referring Timothy [name] to the [facility] [place] on 04/19/2018.   Below is a summation of this patient's evaluation and recommendations.  Thank you for your referral. I will keep you informed about this patient's response to treatment.   If you have any questions please do not hesitate to contact me.   Sincerely,  Jessica Priest, MD Allergy / Immunology [facility]   ______________________________________________________________________    NEW PATIENT NOTE  Referring Provider: Lupita Leash, MD Primary Provider: Barron Alvine, MD Date of office visit: 04/19/2018    Subjective:   Chief Complaint:  Timothy [name] (DOB: 11-13-1970) is a 47 y.o. male who presents to the clinic on 04/19/2018 with a chief complaint of Allergic Reaction .     HPI: Timothy [name] presents to this clinic in evaluation of a allergic reaction.  On 16 April 2018 at 3 AM in the morning he developed hand itching and a hot sensation of his hands and quickly developed tongue swelling and oral cavity swelling requiring intubation for 24 hours.  He had no other associated systemic or constitutional symptoms other than the fact that he was coughing violently at the time that his throat was swelling up.  He was treated appropriately in the hospital and extubated within 24 hours and had received systemic steroids and at this point in time has no problems at all except a raspy voice.  There was no obvious provoking factor giving rise to this episode.  He has not really started any new medications other than Suboxone for the past 4 months.  He did not take any over-the-counter medications such as nonsteroidal anti-inflammatory drugs.  He has not really had a significant environmental change.  He did eat grilled hamburger at 6 PM with mayonnaise and mustard and chips which is his usual meal.  As well,  apparently there may have been a red raised lesion on his back with a central puncture mark when he presented to the emergency room which has since resolved.  Past Medical History:  Diagnosis Date  . Carpal tunnel syndrome on both sides 03/28/2013  . Chronic headache   . DVT (deep venous thrombosis) (HCC)    2017-2018: 4 dvts  . GERD (gastroesophageal reflux disease)   . Helicobacter pylori gastritis 06/30/06  . 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 Pulmonary embolism (HCC)    2013: 2 total  . TIA (transient ischemic attack)     Past Surgical History:  Procedure Laterality Date  . KNEE SURGERY Right 10/2016    Allergies as of 04/19/2018      Reactions   Ioxaglate Shortness Of Breath, Other (See Comments)   dizziness   Ivp Dye [iodinated Diagnostic Agents] Shortness Of Breath, Other (See Comments)   dizziness   Shellfish Allergy       Medication List      buprenorphine-naloxone 8-2 mg Subl SL tablet Commonly known as:  SUBOXONE Place 1 tablet under the tongue daily.   EPINEPHrine 0.3 mg/0.3 mL Soaj injection Commonly known as:  EPI-PEN   omeprazole 40 MG capsule Commonly known as:  PRILOSEC Take 1 capsule (40 mg total) by mouth 2 (two) times daily.   propranolol ER 80 MG 24 hr capsule Commonly known as:  INDERAL LA TAKE ONE CAPSULE EVERY DAY   rivaroxaban 20 MG Tabs tablet Commonly known as:  XARELTO Take 20 mg  by mouth daily with supper. Pharmacy states last filled August 2019 for one month supply.   sertraline 100 MG tablet Commonly known as:  ZOLOFT Take 100 mg by mouth daily.   traZODone 100 MG tablet Commonly known as:  DESYREL Take 100 mg by mouth daily.       Review of systems negative except as noted in HPI / PMHx or noted below:  Review of Systems  Constitutional: Negative.   HENT: Negative.   Eyes: Negative.   Respiratory: Negative.   Cardiovascular: Negative.   Gastrointestinal: Negative.   Genitourinary: Negative.     Musculoskeletal: Negative.   Skin: Negative.   Neurological: Negative.   Endo/Heme/Allergies: Negative.   Psychiatric/Behavioral: Negative.     Family History  Problem Relation Age of Onset  . Barrett's esophagus Mother   . Breast cancer Mother   . Irritable bowel syndrome Brother   . Allergies Brother   . Diabetes Brother   . Heart disease Father   . Heart attack Father   . Diabetes Sister   . Diabetes Paternal Grandfather   . Heart disease Paternal Grandfather   . Heart disease Paternal Grandmother   . Heart disease Maternal Grandmother   . Heart disease Maternal Grandfather   . Colon cancer Neg Hx   . Esophageal cancer Neg Hx     Social History   Socioeconomic History  . Marital status: Married    Spouse name: Not on file  . Number of children: 1  . Years of education: Not on file  . Highest education level: Not on file  Occupational History  . Occupation: Full time student  Social Needs  . Financial resource strain: Not on file  . Food insecurity:    Worry: Not on file    Inability: Not on file  . Transportation needs:    Medical: Not on file    Non-medical: Not on file  Tobacco Use  . Smoking status: Never Smoker  . Smokeless tobacco: Former Engineer, waterUser  Substance and Sexual Activity  . Alcohol use: No    Alcohol/week: 0.0 standard drinks  . Drug use: Yes    Types: Other-see comments, Oxycodone, Heroin    Comment: Former Use - 12/04/15  . Sexual activity: Not Currently    Partners: Female  Lifestyle  . Physical activity:    Days per week: Not on file    Minutes per session: Not on file  . Stress: Not on file  Relationships  . Social connections:    Talks on phone: Not on file    Gets together: Not on file    Attends religious service: Not on file    Active member of club or organization: Not on file    Attends meetings of clubs or organizations: Not on file    Relationship status: Not on file  . Intimate partner violence:    Fear of current or ex  partner: Not on file    Emotionally abused: Not on file    Physically abused: Not on file    Forced sexual activity: Not on file  Other Topics Concern  . Not on file  Social History Narrative   Lives with wife and children in a one story home.  Has 2 children.     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     Education: college.    Environmental and Social history  Lives in a house with a dry environment, cats located inside the household, no carpet in the bedroom, plastic on the  bed, plastic on the pillow, no smokers located inside the household.  Objective:   Vitals:   04/19/18 0941  BP: (!) 144/88  Pulse: 60  Resp: 14  Temp: 98.5 F (36.9 C)   Height: 6' 1.8" (187.5 cm) Weight: 271 lb (122.9 kg)  Physical Exam  HENT:  Head: Normocephalic. Head is without right periorbital erythema and without left periorbital erythema.  Right Ear: Tympanic membrane, external ear and ear canal normal.  Left Ear: Tympanic membrane, external ear and ear canal normal.  Nose: Nose normal. No mucosal edema or rhinorrhea.  Mouth/Throat: Oropharynx is clear and moist and mucous membranes are normal. No oropharyngeal exudate.  Eyes: Pupils are equal, round, and reactive to light. Conjunctivae and lids are normal.  Neck: Trachea normal. No tracheal deviation present. No thyromegaly present.  Cardiovascular: Normal rate, regular rhythm, S1 normal, S2 normal and normal heart sounds.  No murmur heard. Pulmonary/Chest: Effort normal. No stridor. No respiratory distress. He has no wheezes. He has no rales. He exhibits no tenderness.  Abdominal: Soft. He exhibits no distension and no mass. There is no hepatosplenomegaly. There is no tenderness. There is no rebound and no guarding.  Musculoskeletal: He exhibits no edema or tenderness.  Lymphadenopathy:       Head (right side): No tonsillar adenopathy present.       Head (left side): No tonsillar adenopathy present.    He has no cervical adenopathy.    He has no axillary  adenopathy.  Neurological: He is alert.  Skin: No rash noted. He is not diaphoretic. No erythema. No pallor. Nails show no clubbing.    Diagnostics: Allergy skin tests were performed.  He did not demonstrate any hypersensitivity against a screening panel of foods.  Results of blood tests obtained 16 April 2018 identified WBC 6.5, absolute eosinophil 100, absolute lymphocyte 2700, hemoglobin 15.3, platelet 175, creatinine 0.80 mg/DL, AST 30 4U/L, ALT 18 U/L  Results of a chest x-ray obtained 16 April 2018 identified the following:  1. Endotracheal tube seen ending 3 cm above the carina. 2. Lungs mildly hypoexpanded. Mild bibasilar airspace opacities likely reflect atelectasis. 3. Borderline cardiomegaly.  Assessment and Plan:    1. Anaphylaxis, subsequent encounter   2. Angioedema, subsequent encounter     1.  Allergen avoidance measures?  2.  Use a combination of the following every day:   A.  Cetirizine 10 mg -1 tablet 1 time per day  B.  Famotidine 40 mg -1 tablet 1 time per day  3.  If needed: Auvi-Q, Benadryl, MD/ER evaluation for allergic reaction  4.  Blood - C4, tryptase, alpha gal panel, TSH, T4, TP, fire ant panel, hymenoptera panel  5. Further evaluation?  Yes if recurrent reactions  6.  Return to clinic in 4 weeks or earlier if problem  7.  Inderal?  Suboxone?  Timothy [name] appear to have a very significant episode of immunological hyperreactivity with unknown etiologic factor.  We will screen his blood for various conditions associated with this type of reactivity.  Because he may have had a insect sting on his back at the time of his reaction we will also screen him for hymenoptera hypersensitivity.  He is on Inderal and we need to consider having him discontinue this beta-blocker if he is going to be an individual who has recurrent allergic reactions in the future.  The only new medication that he has been on for the past 4 months is Suboxone.  Although this would  be a  relatively unusual reactivity directed against Suboxone if he does have recurrent allergic reactions we may need to consider removing this agent.  He will remain on an H1 and H2 receptor blocker utilized on a consistent basis at this point in time until we can get things a little more straightened out about the cause of this immunological hyperreactivity.  Jessica Priest, MD Allergy / Immunology [facility] [place]

## 2018-04-19 NOTE — Patient Instructions (Addendum)
  1.  Allergen avoidance measures?  2.  Use a combination of the following every day:   A.  Cetirizine 10 mg -1 tablet 1 time per day  B.  Famotidine 40 mg -1 tablet 1 time per day  3.  If needed: Auvi-Q, Benadryl, MD/ER evaluation for allergic reaction  4.  Blood - C4, tryptase, alpha gal panel, TSH, T4, TP, fire ant panel, hymenoptera panel  5. Further evaluation?  Yes if recurrent reactions  6.  Return to clinic in 4 weeks or earlier if problem  7.  Inderal? Suboxone?

## 2018-04-20 MED FILL — OMEPRAZOLE 40 MG CPDR: 40 | 30 days supply | Qty: 60 | Fill #0

## 2018-04-23 ENCOUNTER — Encounter: Payer: Self-pay | Admitting: Allergy and Immunology

## 2018-04-25 MED FILL — ZUBSOLV 5.7-1.4 MG TAB SL: 5.7-1.4 | 28 days supply | Qty: 56 | Fill #0

## 2018-05-21 ENCOUNTER — Ambulatory Visit: Payer: 59 | Admitting: Allergy and Immunology

## 2018-05-23 MED FILL — ZUBSOLV 5.7-1.4 MG TAB SL: 5.7-1.4 | 28 days supply | Qty: 56 | Fill #0

## 2018-05-23 MED FILL — SERTRALINE HCL 100 MG TAB: 100 | 30 days supply | Qty: 30 | Fill #0

## 2018-05-23 MED FILL — traZODone HCL 100 MG TABS: 100 | 30 days supply | Qty: 30 | Fill #0

## 2018-06-18 ENCOUNTER — Ambulatory Visit: Payer: 59 | Admitting: Allergy and Immunology

## 2018-06-18 DIAGNOSIS — J309 Allergic rhinitis, unspecified: Secondary | ICD-10-CM

## 2018-06-25 MED FILL — traZODone HCL 100 MG TABS: 100 | 30 days supply | Qty: 30 | Fill #0

## 2018-06-25 MED FILL — SERTRALINE HCL 100 MG TAB: 100 | 30 days supply | Qty: 30 | Fill #0

## 2018-07-04 MED FILL — ZUBSOLV 5.7-1.4 MG TAB SL: 5.7-1.4 | 28 days supply | Qty: 56 | Fill #0

## 2018-07-31 MED FILL — ZUBSOLV 5.7-1.4 MG TAB SL: 5.7-1.4 | 14 days supply | Qty: 28 | Fill #0

## 2018-08-02 MED FILL — SERTRALINE HCL 100 MG TAB: 100 | 30 days supply | Qty: 30 | Fill #0

## 2018-08-02 MED FILL — traZODone HCL 100 MG TABS: 100 | 30 days supply | Qty: 30 | Fill #0

## 2018-08-15 MED FILL — ZUBSOLV 5.7-1.4 MG TAB SL: 5.7-1.4 | 14 days supply | Qty: 28 | Fill #0

## 2018-08-29 MED FILL — ZUBSOLV 5.7-1.4 MG TAB SL: 5.7-1.4 | 14 days supply | Qty: 28 | Fill #0

## 2018-10-10 ENCOUNTER — Encounter (HOSPITAL_COMMUNITY): Payer: Self-pay | Admitting: Emergency Medicine

## 2018-10-10 ENCOUNTER — Ambulatory Visit (HOSPITAL_COMMUNITY)
Admission: EM | Admit: 2018-10-10 | Discharge: 2018-10-10 | Disposition: A | Discharge status: Home / Self Care | Payer: 59 | Attending: Family Medicine | Admitting: Family Medicine

## 2018-10-10 ENCOUNTER — Other Ambulatory Visit: Payer: Self-pay

## 2018-10-10 ENCOUNTER — Telehealth (HOSPITAL_COMMUNITY): Payer: Self-pay | Admitting: Emergency Medicine

## 2018-10-10 DIAGNOSIS — M25561 Pain in right knee: Secondary | ICD-10-CM

## 2018-10-10 DIAGNOSIS — G8929 Other chronic pain: Secondary | ICD-10-CM | POA: Diagnosis not present

## 2018-10-10 DIAGNOSIS — K219 Gastro-esophageal reflux disease without esophagitis: Secondary | ICD-10-CM

## 2018-10-10 MED ORDER — OMEPRAZOLE 40 MG PO CPDR: 40.0000 mg | DELAYED_RELEASE_CAPSULE | Freq: Two times a day (BID) | ORAL | 1 refills | Status: DC

## 2018-10-10 MED ORDER — PREDNISONE 10 MG (21) PO TBPK: ORAL_TABLET | Freq: Every day | ORAL | 0 refills | Status: DC

## 2018-10-10 MED FILL — OMEPRAZOLE 40 MG CPDR: 40 | 30 days supply | Qty: 60 | Fill #0

## 2018-10-10 MED FILL — predniSONE 10 MG (21) TBPK: 10 | 6 days supply | Qty: 21 | Fill #0

## 2018-10-10 NOTE — ED Triage Notes (Signed)
Pt here for right knee pain chronic in nature with some swelling; pt denies new injury
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
## 2018-11-19 ENCOUNTER — Other Ambulatory Visit: Payer: Self-pay

## 2018-11-19 ENCOUNTER — Emergency Department (HOSPITAL_COMMUNITY)
Admission: EM | Admit: 2018-11-19 | Discharge: 2018-11-19 | Disposition: A | Discharge status: Home / Self Care | Payer: 59 | Attending: Emergency Medicine | Admitting: Emergency Medicine

## 2018-11-19 DIAGNOSIS — Z79899 Other long term (current) drug therapy: Secondary | ICD-10-CM | POA: Diagnosis not present

## 2018-11-19 DIAGNOSIS — T7840XA Allergy, unspecified, initial encounter: Secondary | ICD-10-CM | POA: Diagnosis not present

## 2018-11-19 DIAGNOSIS — Z87891 Personal history of nicotine dependence: Secondary | ICD-10-CM | POA: Insufficient documentation

## 2018-11-19 DIAGNOSIS — Z8673 Personal history of transient ischemic attack (TIA), and cerebral infarction without residual deficits: Secondary | ICD-10-CM | POA: Insufficient documentation

## 2018-11-19 DIAGNOSIS — R21 Rash and other nonspecific skin eruption: Secondary | ICD-10-CM | POA: Diagnosis present

## 2018-11-19 MED ORDER — DOXYCYCLINE HYCLATE 100 MG PO CAPS: 100.0000 mg | ORAL_CAPSULE | Freq: Two times a day (BID) | ORAL | 0 refills | Status: DC

## 2018-11-19 MED ORDER — EPINEPHRINE 0.3 MG/0.3ML IJ SOAJ
0.3000 mg | Freq: Once | INTRAMUSCULAR | Status: AC
  Administered 2018-11-19: 0.3 mg via INTRAMUSCULAR
  Filled 2018-11-19: qty 0.3

## 2018-11-19 MED ORDER — PREDNISONE 20 MG PO TABS: ORAL_TABLET | ORAL | 0 refills | Status: DC

## 2018-11-19 MED ORDER — EPINEPHRINE 0.3 MG/0.3ML IJ SOAJ: 0.3000 mg | Freq: Once | INTRAMUSCULAR | 1 refills | Status: DC | PRN

## 2018-11-19 MED ORDER — SODIUM CHLORIDE 0.9 % IV BOLUS
1000.0000 mL | Freq: Once | INTRAVENOUS | Status: AC
  Administered 2018-11-19: 1000 mL via INTRAVENOUS

## 2018-11-19 MED ORDER — FAMOTIDINE IN NACL 20-0.9 MG/50ML-% IV SOLN
20.0000 mg | INTRAVENOUS | Status: AC
  Administered 2018-11-19: 20 mg via INTRAVENOUS
  Filled 2018-11-19: qty 50

## 2018-11-19 MED ORDER — DIPHENHYDRAMINE HCL 50 MG/ML IJ SOLN
50.0000 mg | Freq: Once | INTRAMUSCULAR | Status: AC
  Administered 2018-11-19: 50 mg via INTRAVENOUS
  Filled 2018-11-19: qty 1

## 2018-11-19 NOTE — Discharge Instructions (Addendum)
Keep your epi pen with you at all times.  Start the prednisone tomorrow, you have already had dose of steroids for today. I have written you prescription for doxycycline.  You DO NOT need to start this UNLESS you develop bullseye appearing rash or similar near your knee, severe joint aches, fever, etc.  If do, you need to come back in for evaluation. Follow-up with your primary care doctor. Return to the ED for new or worsening symptoms.

## 2018-11-19 NOTE — ED Triage Notes (Addendum)
Per EMS, patient coming from home with complaints of a tick bite to left inner thigh about an hour ago. Patient had to be intubated last time he had a tick bite. Patient administered his epi pen to himself and EMS gave him 2 epi injections (0.3 mg). Last epi was about 10 minutes ago, around 0050. He has also had 125 mg of solumedrol and 2 grams of mag. Fire administered benadryl and instructed patient to chew it as his mouth was too dry to swallow it.    Patient was SOB, wheezing, and hives all over. Denies N/V/D. SpO2 90% on RA, EMS placed patient on non-rebreather.

## 2018-11-19 NOTE — ED Provider Notes (Signed)
[facility]-EMERGENCY DEPT Provider Note   CSN: [id number] Arrival date & time: 11/19/18  0059     History   Chief Complaint Chief Complaint  Patient presents with  . Allergic Reaction    HPI Nelson ChimesJason L Dike is a 48 y.o. male.     The history is provided by the patient and medical records.  Allergic Reaction Presenting symptoms: rash     48 year old male with history of acid reflux, mosaic Klinefelter syndrome, history of TIA, history of PE on Xarelto, presenting to the ED with allergic reaction.  States he was outside mowing grass for most of the day today and came inside and felt itchy.  He noticed a tick on his left medial thigh.  States he remove this without issue.  Shortly after he developed difficulty breathing, hives, and shortness of breath.  Reports when he had a tick bite in November 2019 he ended up intubated.  States during that episode swelling of his tongue and throat happened very rapidly and he was intubated within about 10 minutes of arrival of the hospital.  States currently, it does not feel to that level.  He was given epi x2, magnesium, and Solu-Medrol with EMS.  They tried to give him oral Benadryl, however he had difficulty swallowing it due to dry mouth so was told to chew it up, but could not actually get it down.  Aside from take allergies he is also allergic to shellfish and IV dye.  Past Medical History:  Diagnosis Date  . Carpal tunnel syndrome on both sides 03/28/2013  . Chronic headache   . DVT (deep venous thrombosis) (HCC)    2017-2018: 4 dvts  . GERD (gastroesophageal reflux disease)   . Helicobacter pylori gastritis 06/30/06  . 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 Pulmonary embolism (HCC)    2013: 2 total  . TIA (transient ischemic attack)     Patient Active Problem List   Diagnosis Date Noted  . Angioedema 04/16/2018  . Bipolar affect, depressed (HCC) 12/06/2015  . Severe opioid use disorder (HCC)   . Recurrent  vertigo 02/12/2015  . Generalized anxiety disorder 12/31/2014  . Major depression, chronic 12/31/2014  . Hypersomnolence disorder, acute, moderate 12/31/2014  . Migraine without aura and without status migrainosus, not intractable 10/15/2014  . Essential tremor 10/15/2014  . Upper airway resistance syndrome 09/22/2014  . Carpal tunnel syndrome on both sides 03/28/2013  . 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 Pleuritic chest pain 11/22/2011  . Cough 11/22/2011  . Hypersomnia 11/05/2010    Past Surgical History:  Procedure Laterality Date  . KNEE SURGERY Right 10/2016        Home Medications    Prior to Admission medications   Medication Sig Start Date End Date Taking? Authorizing Provider  AUVI-Q 0.3 MG/0.3ML SOAJ injection Use as directed for life-threatening allergic reaction. 04/19/18   Kozlow, Alvira PhilipsEric J, MD  EPINEPHrine 0.3 mg/0.3 mL IJ SOAJ injection  04/18/18   [provider]  famotidine (PEPCID) 40 MG tablet Take 1 tablet (40 mg total) by mouth daily. 04/19/18   Kozlow, Alvira PhilipsEric J, MD  omeprazole (PRILOSEC) 40 MG capsule Take 1 capsule (40 mg total) by mouth 2 (two) times daily. 10/10/18   Mardella LaymanHagler, Brian, MD  predniSONE (STERAPRED UNI-PAK 21 TAB) 10 MG (21) TBPK tablet Take by mouth daily. Take as directed. 10/10/18   Mardella LaymanHagler, Brian, MD  propranolol ER (INDERAL LA) 80 MG 24 hr capsule TAKE ONE CAPSULE EVERY DAY 07/04/16  Patel, Donika K, DO  rivaroxaban (XARELTO) 20 MG TABS tablet Take 20 mg by mouth daily with supper. Pharmacy states last filled August 2019 for one month supply.    [provider]  sertraline (ZOLOFT) 100 MG tablet Take 100 mg by mouth daily.  04/13/18   [provider]  traZODone (DESYREL) 100 MG tablet Take 100 mg by mouth daily.  04/13/18   [provider]  ZUBSOLV 5.7-1.4 MG SUBL DISOLVE 1 TABLET UNDER THE TONGUE 2 TIMES DAILY 03/20/18   [provider]    Family History Family History  Problem Relation Age of  Onset  . Barrett's esophagus Mother   . Breast cancer Mother   . Irritable bowel syndrome Brother   . Allergies Brother   . Diabetes Brother   . Heart disease Father   . Heart attack Father   . Diabetes Sister   . Diabetes Paternal Grandfather   . Heart disease Paternal Grandfather   . Heart disease Paternal Grandmother   . Heart disease Maternal Grandmother   . Heart disease Maternal Grandfather   . Colon cancer Neg Hx   . Esophageal cancer Neg Hx     Social History Social History   Tobacco Use  . Smoking status: Never Smoker  . Smokeless tobacco: Former Engineer, waterUser  Substance Use Topics  . Alcohol use: No    Alcohol/week: 0.0 standard drinks  . Drug use: Yes    Types: Other-see comments, Oxycodone, Heroin    Comment: Former Use - 12/04/15     Allergies   Ioxaglate, Ivp dye [iodinated diagnostic agents], and Shellfish allergy   Review of Systems Review of Systems  Skin: Positive for rash.  All other systems reviewed and are negative.    Physical Exam Updated Vital Signs BP (!) 170/80 (BP Location: Right Arm)   Pulse 93   Temp 97.7 F (36.5 C) (Oral)   Resp 20   Ht 6\' 4"  (1.93 m)   Wt 111.1 kg   SpO2 97%   BMI 29.82 kg/m   Physical Exam Vitals signs and nursing note reviewed.  Constitutional:      Appearance: He is well-developed.  HENT:     Head: Normocephalic and atraumatic.     Mouth/Throat:     Comments: No significant lip or tongue swelling noted at this time, his airway is patent and he is talking easily, normal phonation without stridor Eyes:     Conjunctiva/sclera: Conjunctivae normal.     Pupils: Pupils are equal, round, and reactive to light.  Neck:     Musculoskeletal: Normal range of motion.  Cardiovascular:     Rate and Rhythm: Normal rate and regular rhythm.     Heart sounds: Normal heart sounds.  Pulmonary:     Effort: Pulmonary effort is normal.     Breath sounds: Normal breath sounds.     Comments: Lungs are clear without any  wheezes or rhonchi Abdominal:     General: Bowel sounds are normal.     Palpations: Abdomen is soft.  Musculoskeletal: Normal range of motion.       Legs:  Skin:    General: Skin is warm and dry.     Findings: Rash present. Rash is urticarial.     Comments: Urticarial rash of the arms and lower abdomen, no lesions on the palms or soles  Neurological:     Mental Status: He is alert and oriented to person, place, and time.      ED Treatments /  Results  Labs (all labs ordered are listed, but only abnormal results are displayed) Labs Reviewed - No data to display  EKG    Radiology No results found.  Procedures Procedures (including critical care time)  CRITICAL CARE Performed by: Garlon HatchetLisa M Kimie Pidcock   Total critical care time: 45 minutes  Critical care time was exclusive of separately billable procedures and treating other patients.  Critical care was necessary to treat or prevent imminent or life-threatening deterioration.  Critical care was time spent personally by me on the following activities: development of treatment plan with patient and/or surrogate as well as nursing, discussions with consultants, evaluation of patient's response to treatment, examination of patient, obtaining history from patient or surrogate, ordering and performing treatments and interventions, ordering and review of laboratory studies, ordering and review of radiographic studies, pulse oximetry and re-evaluation of patient's condition.   Medications Ordered in ED Medications  EPINEPHrine (EPI-PEN) injection 0.3 mg (has no administration in time range)  diphenhydrAMINE (BENADRYL) injection 50 mg (50 mg Intravenous Given 11/19/18 0125)  famotidine (PEPCID) IVPB 20 mg premix (0 mg Intravenous Stopped 11/19/18 0159)  sodium chloride 0.9 % bolus 1,000 mL (0 mLs Intravenous Stopped 11/19/18 0307)     Initial Impression / Assessment and Plan / ED Course  I have reviewed the triage vital signs and the  nursing notes.  Pertinent labs & imaging results that were available during my care of the patient were reviewed by me and considered in my medical decision making (see chart for details).  48 year old male here with allergic reaction.  He was bit by a tick today while mowing grass and removed this prior to arrival.  Shortly after he began having shortness of breath, sensation of tongue swelling, and broke out in a rash.  States the last time this happened he was intubated.  He was given epinephrine x2, Solu-Medrol, and magnesium with fire/EMS.  They attempted to give him oral Benadryl, however unable to swallow it due to dry mouth and unfortunately did not get any of it down.  On arrival to ED he is hemodynamically stable.  His oxygen is appropriate, he is not hypotensive.  He is somewhat jittery which I suspect is from the epinephrine.  He does have urticarial rash of the upper arms and lower abdomen.  No signs of superimposed infection.  Tick bite site noted to left lower medial thigh/knee.  He is airway is patent at this time, he is talking easily with normal phonation, no stridor.  He is handling his secretions well.  He does not have any apparent lip or tongue swelling at this time.  Given his history, we will monitor very closely.  He was given IV Benadryl along with fluids and IV Pepcid.  Emergency airway equipment was placed outside of his room in preparation.  I discussed with patient should he decompensate and require intubation if he would want to pursue this and he agreed he would want this done.  1:40 AM Re-checked patient-- states he feels ok, no change.  He does feel sleepy from the benadryl.  He is requesting to drink water.  Will continue to monitor closely.  2:06 AM Continues feeling ok.  Still sleepy.  VSS.  No change in airway at this time.    2:38 AM Patient states he is feeling better.  Tongue feels normal now.  He is continuing to drink here without issue.  Will need to monitor a  while longer given epi administration.  He is  agreeable.  3:46 AM Patient continues doing well.  No issues currently.  He is resting.  6:29 AM Patient has been resting for remainder of night.  He has tolerated several cups of water without issue.  He denies any sensation of lip, tongue, or throat swelling at this time.  VS remain stable.  He has been observed for almost 6 hours without any recurrence of symptoms.  I feel he is stable for discharge home.  He was given epi pen here to take with him as well as prescription for same and prednisone.  I does not sound like a tick was adhered to him for very long and was able to removed fully intact.  Advised warning signs that he should look out for regarding tickborne illness.  He will be sent with prescription for doxycycline to take if developing rash, fever, or other concerning symptoms.  I recommended that he follow-up closely with his primary care doctor.   He will return here for any new/acute changes.  Discussed with attending physician, Dr. Laverta Baltimore, who agrees with assessment and plan of care.  Final Clinical Impressions(s) / ED Diagnoses   Final diagnoses:  Allergic reaction, initial encounter    ED Discharge Orders    None       Larene Pickett, PA-C 11/19/18 [id number]    Margette Fast, MD 11/20/18 [phone]

## 2018-11-19 NOTE — ED Notes (Signed)
Bed: WA21 Expected date:  Expected time:  Means of arrival:  Comments: Allergic reaction 

## 2018-11-19 NOTE — ED Notes (Signed)
Provided patient with water. Patient A&O x4 and stable at this time. Denies any needs. Will continue to monitor patient.

## 2018-12-02 ENCOUNTER — Other Ambulatory Visit: Payer: Self-pay

## 2018-12-02 ENCOUNTER — Observation Stay (HOSPITAL_COMMUNITY)
Admission: EM | Admit: 2018-12-02 | Discharge: 2018-12-03 | Disposition: A | Discharge status: Home / Self Care | Payer: 59 | Attending: Internal Medicine | Admitting: Internal Medicine

## 2018-12-02 DIAGNOSIS — F1910 Other psychoactive substance abuse, uncomplicated: Secondary | ICD-10-CM | POA: Diagnosis not present

## 2018-12-02 DIAGNOSIS — Z9114 Patient's other noncompliance with medication regimen: Secondary | ICD-10-CM

## 2018-12-02 DIAGNOSIS — Z91041 Radiographic dye allergy status: Secondary | ICD-10-CM

## 2018-12-02 DIAGNOSIS — Z7901 Long term (current) use of anticoagulants: Secondary | ICD-10-CM | POA: Diagnosis not present

## 2018-12-02 DIAGNOSIS — Z79899 Other long term (current) drug therapy: Secondary | ICD-10-CM | POA: Insufficient documentation

## 2018-12-02 DIAGNOSIS — Z8673 Personal history of transient ischemic attack (TIA), and cerebral infarction without residual deficits: Secondary | ICD-10-CM | POA: Diagnosis not present

## 2018-12-02 DIAGNOSIS — Z86718 Personal history of other venous thrombosis and embolism: Secondary | ICD-10-CM | POA: Diagnosis not present

## 2018-12-02 DIAGNOSIS — X58XXXA Exposure to other specified factors, initial encounter: Secondary | ICD-10-CM | POA: Diagnosis not present

## 2018-12-02 DIAGNOSIS — F1111 Opioid abuse, in remission: Secondary | ICD-10-CM

## 2018-12-02 DIAGNOSIS — Z1159 Encounter for screening for other viral diseases: Secondary | ICD-10-CM | POA: Diagnosis not present

## 2018-12-02 DIAGNOSIS — Z86711 Personal history of pulmonary embolism: Secondary | ICD-10-CM | POA: Diagnosis not present

## 2018-12-02 DIAGNOSIS — K219 Gastro-esophageal reflux disease without esophagitis: Secondary | ICD-10-CM | POA: Insufficient documentation

## 2018-12-02 DIAGNOSIS — F1511 Other stimulant abuse, in remission: Secondary | ICD-10-CM

## 2018-12-02 DIAGNOSIS — Z888 Allergy status to other drugs, medicaments and biological substances status: Secondary | ICD-10-CM | POA: Diagnosis not present

## 2018-12-02 DIAGNOSIS — E876 Hypokalemia: Secondary | ICD-10-CM | POA: Diagnosis not present

## 2018-12-02 DIAGNOSIS — T783XXA Angioneurotic edema, initial encounter: Principal | ICD-10-CM | POA: Insufficient documentation

## 2018-12-02 DIAGNOSIS — L509 Urticaria, unspecified: Secondary | ICD-10-CM

## 2018-12-02 DIAGNOSIS — F329 Major depressive disorder, single episode, unspecified: Secondary | ICD-10-CM | POA: Diagnosis not present

## 2018-12-02 LAB — CBC
HCT: 46 % (ref 39.0–52.0)
Hemoglobin: 14.9 g/dL (ref 13.0–17.0)
MCH: 30.5 pg (ref 26.0–34.0)
MCHC: 32.4 g/dL (ref 30.0–36.0)
MCV: 94.1 fL (ref 80.0–100.0)
Platelets: 144 10*3/uL — ABNORMAL LOW (ref 150–400)
RBC: 4.89 MIL/uL (ref 4.22–5.81)
RDW: 13.7 % (ref 11.5–15.5)
WBC: 6.7 10*3/uL (ref 4.0–10.5)
nRBC: 0 % (ref 0.0–0.2)

## 2018-12-02 LAB — BASIC METABOLIC PANEL
Anion gap: 12 (ref 5–15)
BUN: 14 mg/dL (ref 6–20)
CO2: 26 mmol/L (ref 22–32)
Calcium: 8.9 mg/dL (ref 8.9–10.3)
Chloride: 105 mmol/L (ref 98–111)
Creatinine, Ser: 1.03 mg/dL (ref 0.61–1.24)
GFR calc Af Amer: >60 mL/min (ref >60)
GFR calc non Af Amer: >60 mL/min (ref >60)
Glucose, Bld: 160 mg/dL — ABNORMAL HIGH (ref 70–99)
Potassium: 3.2 mmol/L — ABNORMAL LOW (ref 3.5–5.1)
Sodium: 143 mmol/L (ref 135–145)

## 2018-12-02 MED ORDER — FAMOTIDINE IN NACL 20-0.9 MG/50ML-% IV SOLN
20.0000 mg | Freq: Once | INTRAVENOUS | Status: AC
  Administered 2018-12-02: 20 mg via INTRAVENOUS
  Filled 2018-12-02: qty 50

## 2018-12-02 MED ORDER — TRAZODONE HCL 100 MG PO TABS
100.0000 mg | ORAL_TABLET | Freq: Every day | ORAL | Status: DC
  Administered 2018-12-02: 100 mg via ORAL
  Filled 2018-12-02: qty 1

## 2018-12-02 MED ORDER — PROMETHAZINE HCL 25 MG PO TABS: 12.5000 mg | ORAL_TABLET | Freq: Four times a day (QID) | ORAL | Status: DC | PRN

## 2018-12-02 MED ORDER — ENOXAPARIN SODIUM 40 MG/0.4ML ~~LOC~~ SOLN
40.0000 mg | SUBCUTANEOUS | Status: DC
  Administered 2018-12-02: 40 mg via SUBCUTANEOUS
  Filled 2018-12-02: qty 0.4

## 2018-12-02 MED ORDER — RIVAROXABAN 20 MG PO TABS: 20.0000 mg | ORAL_TABLET | Freq: Every day | ORAL | Status: DC

## 2018-12-02 MED ORDER — METHADONE HCL 10 MG/ML PO CONC: 40.0000 mg | Freq: Every day | ORAL | Status: DC

## 2018-12-02 MED ORDER — ACETAMINOPHEN 325 MG PO TABS: 650.0000 mg | ORAL_TABLET | Freq: Four times a day (QID) | ORAL | Status: DC | PRN

## 2018-12-02 MED ORDER — PANTOPRAZOLE SODIUM 40 MG PO TBEC
40.0000 mg | DELAYED_RELEASE_TABLET | Freq: Every day | ORAL | Status: DC
  Administered 2018-12-02 – 2018-12-03 (×2): 40 mg via ORAL
  Filled 2018-12-02 (×2): qty 1

## 2018-12-02 MED ORDER — SERTRALINE HCL 100 MG PO TABS
100.0000 mg | ORAL_TABLET | Freq: Every day | ORAL | Status: DC
  Administered 2018-12-02 – 2018-12-03 (×2): 100 mg via ORAL
  Filled 2018-12-02 (×2): qty 1

## 2018-12-02 MED ORDER — SENNOSIDES-DOCUSATE SODIUM 8.6-50 MG PO TABS: 1.0000 | ORAL_TABLET | Freq: Every evening | ORAL | Status: DC | PRN

## 2018-12-02 MED ORDER — METHADONE HCL 10 MG PO TABS
40.0000 mg | ORAL_TABLET | Freq: Every day | ORAL | Status: DC
  Administered 2018-12-02 – 2018-12-03 (×2): 40 mg via ORAL
  Filled 2018-12-02 (×2): qty 4

## 2018-12-02 MED ORDER — TRANEXAMIC ACID-NACL 1000-0.7 MG/100ML-% IV SOLN
1000.0000 mg | Freq: Once | INTRAVENOUS | Status: AC
  Administered 2018-12-02: 07:00:00 1000 mg via INTRAVENOUS
  Filled 2018-12-02: qty 100

## 2018-12-02 MED ORDER — EPINEPHRINE 0.3 MG/0.3ML IJ SOAJ
0.3000 mg | Freq: Once | INTRAMUSCULAR | Status: DC | PRN
  Filled 2018-12-02: qty 0.6

## 2018-12-02 MED ORDER — ACETAMINOPHEN 650 MG RE SUPP: 650.0000 mg | Freq: Four times a day (QID) | RECTAL | Status: DC | PRN

## 2018-12-02 NOTE — ED Provider Notes (Signed)
TIME SEEN: 6:11 AM  CHIEF COMPLAINT: Allergic reaction  HPI: Patient is a 48 year old male with history of previous DVTs and PEs not on anticoagulation, previous angioedema requiring intubation who presents to the emergency department with an allergic reaction.  States he went to bed feeling fine and woke up at 4:30 AM with diffuse urticaria, difficulty breathing and tongue swelling.  He gave himself a dose of IM epinephrine and called EMS.  EMS gave him another 0.3 mg of IM epinephrine, 50 mg of IV Benadryl, 125 mg of IV Solu-Medrol.  He reports his hives and difficulty breathing are improving.  He states he no longer feels short of breath.  States that his tongue however feels still significantly swollen and it is difficult to talk but he does not feel it is getting worse.  He denies any new exposures.  He states that he has had this many times in the past and has seen an outpatient allergy specialist and had allergy testing that he was told was normal.  Patient is not on ACE inhibitor's or angiotensin receptor blockers.  States only medication he takes is methadone.  No family history of angioedema.  ROS: See HPI Constitutional: no fever  Eyes: no drainage  ENT: no runny nose   Cardiovascular:  no chest pain  Resp:  SOB  GI: no vomiting GU: no dysuria Integumentary: no rash  Allergy:  hives  Musculoskeletal: no leg swelling  Neurological: no slurred speech ROS otherwise negative  PAST MEDICAL HISTORY/PAST SURGICAL HISTORY:  Past Medical History:  Diagnosis Date  . Carpal tunnel syndrome on both sides 03/28/2013  . Chronic headache   . DVT (deep venous thrombosis) (HCC)    2017-2018: 4 dvts  . GERD (gastroesophageal reflux disease)   . Helicobacter pylori gastritis 06/30/06  . 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 Pulmonary embolism (HCC)    2013: 2 total  . TIA (transient ischemic attack)     MEDICATIONS:  Prior to Admission medications   Medication Sig Start Date End Date  Taking? Authorizing Provider  doxycycline (VIBRAMYCIN) 100 MG capsule Take 1 capsule (100 mg total) by mouth 2 (two) times daily. 11/19/18   Garlon HatchetSanders, Lisa M, PA-C  EPINEPHrine (EPIPEN 2-PAK) 0.3 mg/0.3 mL IJ SOAJ injection Inject 0.3 mLs (0.3 mg total) into the muscle once as needed for up to 1 dose (for severe allergic reaction). CAll 911 immediately if you have to use this medicine 11/19/18   Garlon HatchetSanders, Lisa M, PA-C  famotidine (PEPCID) 40 MG tablet Take 1 tablet (40 mg total) by mouth daily. Patient not taking: Reported on 11/19/2018 04/19/18   Jessica PriestKozlow, Eric J, MD  omeprazole (PRILOSEC) 40 MG capsule Take 1 capsule (40 mg total) by mouth 2 (two) times daily. 10/10/18   Mardella LaymanHagler, Brian, MD  predniSONE (DELTASONE) 20 MG tablet Take 40 mg by mouth daily for 3 days, then 20mg  by mouth daily for 3 days, then 10mg  daily for 3 days 11/19/18   Garlon HatchetSanders, Lisa M, PA-C  propranolol ER (INDERAL LA) 80 MG 24 hr capsule TAKE ONE CAPSULE EVERY DAY Patient not taking: Reported on 11/19/2018 07/04/16   Nita SicklePatel, Donika K, DO  sertraline (ZOLOFT) 100 MG tablet Take 100 mg by mouth daily.  04/13/18   [provider]  traZODone (DESYREL) 100 MG tablet Take 100 mg by mouth at bedtime.  04/13/18   [provider]  ZUBSOLV 5.7-1.4 MG SUBL Place 1 tablet under the tongue 2 (two) times a day.  03/20/18  [provider]    ALLERGIES:  Allergies  Allergen Reactions  . Ioxaglate Shortness Of Breath and Other (See Comments)    dizziness  . Ivp Dye [Iodinated Diagnostic Agents] Shortness Of Breath and Other (See Comments)    dizziness  . Shellfish Allergy     SOCIAL HISTORY:  Social History   Tobacco Use  . Smoking status: Never Smoker  . Smokeless tobacco: Former Network engineer Use Topics  . Alcohol use: No    Alcohol/week: 0.0 standard drinks    FAMILY HISTORY: Family History  Problem Relation Age of Onset  . Barrett's esophagus Mother   . Breast cancer Mother   . Irritable bowel syndrome  Brother   . Allergies Brother   . Diabetes Brother   . Heart disease Father   . Heart attack Father   . Diabetes Sister   . Diabetes Paternal Grandfather   . Heart disease Paternal Grandfather   . Heart disease Paternal Grandmother   . Heart disease Maternal Grandmother   . Heart disease Maternal Grandfather   . Colon cancer Neg Hx   . Esophageal cancer Neg Hx     EXAM: Pulse 73   Temp 98.3 F (36.8 C) (Oral)   SpO2 98%  CONSTITUTIONAL: Alert and oriented and responds appropriately to questions. Well-appearing; well-nourished HEAD: Normocephalic EYES: Conjunctivae clear, pupils appear equal, EOMI ENT: normal nose; moist mucous membranes; No pharyngeal erythema or petechiae, no tonsillar hypertrophy or exudate, no uvular deviation, no unilateral swelling, no trismus or drooling, patient has a difficult time talking secondary to angioedema, patient has angioedema of his tongue and his tongue is slightly protruded from his mouth but his posterior oropharynx is able to be visualized and does not appear swollen NECK: Supple, no meningismus, no nuchal rigidity, no LAD  CARD: RRR; S1 and S2 appreciated; no murmurs, no clicks, no rubs, no gallops RESP: Normal chest excursion without splinting or tachypnea; breath sounds clear and equal bilaterally; no wheezes, no rhonchi, no rales, no hypoxia or respiratory distress, speaking full sentences ABD/GI: Normal bowel sounds; non-distended; soft, non-tender, no rebound, no guarding, no peritoneal signs, no hepatosplenomegaly BACK:  The back appears normal and is non-tender to palpation, there is no CVA tenderness EXT: Normal ROM in all joints; non-tender to palpation; no edema; normal capillary refill; no cyanosis, no calf tenderness or swelling    SKIN: Normal color for age and race; warm; scattered urticaria on bilateral upper extremities NEURO: Moves all extremities equally PSYCH: The patient's mood and manner are appropriate. Grooming and  personal hygiene are appropriate.  MEDICAL DECISION MAKING: Patient here with allergic reaction.  He had a shortness of breath that has completely resolved.  He also had urticaria that has improved but not completely resolved.  He has significant angioedema of the tongue but his posterior oropharynx does not appear swollen.  I do not feel we need to emergently intubate the patient at this time given his symptoms are improving and he does not feel his angioedema is getting worse.  We will monitor him closely and he is comfortable with this plan.  He is not on any ACE inhibitor's.  He denies any new exposures and has been seen by an allergy specialist for this.  Will give dose of IV TXA to see if this will help with his angioedema.  ED PROGRESS: 7:05 AM  Pt reports his hives have completely resolved.  His angioedema is unchanged and again not worsening.  He has received IV  TXA.  Clear etiology for patient's symptoms today.  Patient will be monitored closely.  He may need admission if angioedema does not improve.  His labs today are unremarkable other than mild thrombocytopenia.  Signed out to oncoming ED physician.   I reviewed all nursing notes, vitals, pertinent previous records, EKGs, lab and urine results, imaging (as available).      CRITICAL CARE Performed by: Chaos Carlile   Total Rochele Raringcritical care time: 60 minutes  Critical care time was exclusive of separately billable procedures and treating other patients.  Critical care was necessary to treat or prevent imminent or life-threatening deterioration.  Critical care was time spent personally by me on the following activities: development of treatment plan with patient and/or surrogate as well as nursing, discussions with consultants, evaluation of patient's response to treatment, examination of patient, obtaining history from patient or surrogate, ordering and performing treatments and interventions, ordering and review of laboratory studies,  ordering and review of radiographic studies, pulse oximetry and re-evaluation of patient's condition.    Ayumi Wangerin, Layla MawKristen N, DO 12/02/18 [phone]

## 2018-12-02 NOTE — ED Notes (Addendum)
ED TO INPATIENT HANDOFF REPORT  ED Nurse Name and Phone #:  Marsh DollyBrooke, RN [phone]  S Name/Age/Gender Timothy [name] 48 y.o. male Room/Bed: 036C/036C  Code Status   Code Status: Full Code  Home/SNF/Other Home Patient oriented to: self, place, time and situation Is this baseline? Yes   Triage Complete: Triage complete  Chief Complaint allergic rxn  Triage Note Pt woke up and felt he was having an allergic reaction. Pt arrives with oral swelling and hives. Airway intact   Allergies Allergies  Allergen Reactions  . Ioxaglate Shortness Of Breath and Other (See Comments)    dizziness  . Ivp Dye [Iodinated Diagnostic Agents] Shortness Of Breath and Other (See Comments)    dizziness  . Shellfish Allergy     Level of Care/Admitting Diagnosis ED Disposition    ED Disposition Condition Comment   Admit  [facility]ospital Area[facility] [[id number]]  Level of Care: Med-Surg [16]  Covid Evaluation: Screening Protocol (No Symptoms)  Diagnosis: Angioedema [098119][190627]  Admitting Physician: Earl LagosNARENDRA, NISCHAL [[id number]]  Attending Physician: Earl LagosNARENDRA, NISCHAL [id number]]  PT Class (Do Not Modify): Observation [104]  PT Acc Code (Do Not Modify): Observation [10022]       B Medical/Surgery History Past Medical History:  Diagnosis Date  . Carpal tunnel syndrome on both sides 03/28/2013  . Chronic headache   . DVT (deep venous thrombosis) (HCC)    2017-2018: 4 dvts  . GERD (gastroesophageal reflux disease)   . Helicobacter pylori gastritis 06/30/06  . 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 Pulmonary embolism (HCC)    2013: 2 total  . TIA (transient ischemic attack)    Past Surgical History:  Procedure Laterality Date  . KNEE SURGERY Right 10/2016     A IV Location/Drains/Wounds Patient Lines/Drains/Airways Status   Active Line/Drains/Airways    Name:   Placement date:   Placement time:   Site:   Days:   Peripheral IV 12/02/18 Anterior;Left Wrist   12/02/18     0604    Wrist   less than 1          Intake/Output Last 24 hours  Intake/Output Summary (Last 24 hours) at 12/02/2018 1106 Last data filed at 12/02/2018 0747 Gross per 24 hour  Intake 142.52 ml  Output -  Net 142.52 ml    Labs/Imaging Results for orders placed or performed during the hospital encounter of 12/02/18 (from the past 48 hour(s))  CBC     Status: Abnormal   Collection Time: 12/02/18  6:21 AM  Result Value Ref Range   WBC 6.7 4.0 - 10.5 K/uL   RBC 4.89 4.22 - 5.81 MIL/uL   Hemoglobin 14.9 13.0 - 17.0 g/dL   HCT 57.846.0 46.939.0 - 62.952.0 %   MCV 94.1 80.0 - 100.0 fL   MCH 30.5 26.0 - 34.0 pg   MCHC 32.4 30.0 - 36.0 g/dL   RDW 52.813.7 41.311.5 - 24.415.5 %   Platelets 144 (L) 150 - 400 K/uL    Comment: REPEATED TO VERIFY   nRBC 0.0 0.0 - 0.2 %    Comment: Performed at [facility], [address] [place], [place] [address]  Basic metabolic panel     Status: Abnormal   Collection Time: 12/02/18  6:21 AM  Result Value Ref Range   Sodium 143 135 - 145 mmol/L   Potassium 3.2 (L) 3.5 - 5.1 mmol/L   Chloride 105 98 - 111 mmol/L   CO2 26 22 - 32 mmol/L  Glucose, Bld 160 (H) 70 - 99 mg/dL   BUN 14 6 - 20 mg/dL   Creatinine, Ser 1.03 0.61 - 1.24 mg/dL   Calcium 8.9 8.9 - 10.3 mg/dL   GFR calc non Af Amer >60 >60 mL/min   GFR calc Af Amer >60 >60 mL/min   Anion gap 12 5 - 15    Comment: Performed at [facility] [address] [place], [place] [address]   No results found.  Pending Labs Unresulted Labs (From admission, onward)    Start     Ordered   12/03/18 0350  Basic metabolic panel  Tomorrow morning,   R     12/02/18 1042   12/03/18 0500  CBC  Tomorrow morning,   R     12/02/18 1042   12/02/18 1027  Novel Coronavirus,NAA,(SEND-OUT TO REF LAB - TAT 24-48 hrs); Hosp Order  (Asymptomatic Patients Labs)  Once,   STAT    Question:  Rule Out  Answer:  Yes   12/02/18 1026          Vitals/Pain Today's Vitals   12/02/18 0945 12/02/18 1000 12/02/18 1015 12/02/18  1056  BP: 118/65 125/72    Pulse: 62 69 71   Resp: 10 10 18    Temp:      TempSrc:      SpO2: 94% 92% 94%   PainSc:    4     Isolation Precautions No active isolations  Medications Medications  sertraline (ZOLOFT) tablet 100 mg (100 mg Oral Given 12/02/18 1100)  traZODone (DESYREL) tablet 100 mg (has no administration in time range)  methadone (DOLOPHINE) tablet 40 mg (40 mg Oral Given 12/02/18 1100)  enoxaparin (LOVENOX) injection 40 mg (has no administration in time range)  acetaminophen (TYLENOL) tablet 650 mg (has no administration in time range)    Or  acetaminophen (TYLENOL) suppository 650 mg (has no administration in time range)  senna-docusate (Senokot-S) tablet 1 tablet (has no administration in time range)  promethazine (PHENERGAN) tablet 12.5 mg (has no administration in time range)  pantoprazole (PROTONIX) EC tablet 40 mg (has no administration in time range)  EPINEPHrine (EPI-PEN) injection 0.3 mg (has no administration in time range)  tranexamic acid (CYKLOKAPRON) IVPB 1,000 mg ( Intravenous Stopped 12/02/18 0658)  famotidine (PEPCID) IVPB 20 mg premix (0 mg Intravenous Stopped 12/02/18 0747)    Mobility walks Low fall risk   Focused Assessments Pulmonary Assessment Handoff:  Lung sounds: L Breath Sounds: Clear, Diminished R Breath Sounds: Clear, Diminished O2 Device: (S) Room Air O2 Flow Rate (L/min): 1 L/min      R Recommendations: See Admitting Provider Note  Report given to: Suezanne Jacquet, RN 6 [facility]  Additional Notes:

## 2018-12-02 NOTE — Plan of Care (Signed)

## 2018-12-02 NOTE — Progress Notes (Signed)
Pt able to swallow thin liquids from bedside simple swallowing assessment. Pt also able to swallow protonix pill with no difficulty. MD aware of result of simple swallow assessment and considering ordering a diet

## 2018-12-02 NOTE — ED Triage Notes (Signed)
Pt woke up and felt he was having an allergic reaction. Pt arrives with oral swelling and hives. Airway intact

## 2018-12-02 NOTE — ED Notes (Signed)
MD at bedside. 

## 2018-12-02 NOTE — Progress Notes (Addendum)
SLP Cancellation Note  Patient Details Name: Timothy [name] MRN: [id number] DOB: 27-Oct-1970   Cancelled treatment:       Reason Eval/Treat Not Completed: Other (comment) Discussed pt with RN, who reports that pt has been taking sips of liquid and medications without overt difficulty and that swelling has gone down significantly. He says that MD is planning to order a diet. Will hold SLP evaluation at this time as his symptoms appear to be resolving. Please reorder SLP with any concerns.   Venita Sheffield Dyanna Seiter 12/02/2018, 2:57 PM  Pollyann Glen, M.A. [place] Acute Environmental education officer [phone] Office [phone]

## 2018-12-02 NOTE — H&P (Signed)
Date: 12/02/2018               Patient Name:  Timothy [name] MRN: [id number]  DOB: May 15, 1971 Age / Sex: 48 y.o., male   PCP: Patient, No Pcp Per         Medical Service: Internal Medicine Teaching Service         Attending Physician: Dr. Earl LagosNarendra, Nischal, MD    First Contact: Dr. Gwyneth RevelsKrienke Pager: [phone]  Second Contact: Dr. Delma Officerhundi Pager: [phone]       After Hours (After 5p/  First Contact Pager: [phone]  weekends / holidays): Second Contact Pager: [phone]   Chief Complaint: Tongue swelling, hives, and shortness of breath  History of Present Illness: This is a 48 year old male with a history of angioedema, TTE, substance abuse(heroin and methamphetamine) who presented after waking up this morning with her eyes, tongue swelling, shortness of breath. Patient stated that he woke up at 415am when he felt that his tongue was swollen and he noticed that he had hives over both arms and over his stomach. States that he has a similar episode 3 weeks ago, at that time the hives were on his legs as well. He notes that both times he mowed his lawn prior to the episodes. Doesn't know if something bit him.  Patient denies any changes in his diet, he states that he ate barbecue the night before, ate at around 5 PM. He feels that his tongues swelling is better now than before.    Has only had 3 allergic reactions (1 year ago, 3 weeks ago, and this hospitalization).  3 weeks ago patient noted a tick bite in his left inner thigh, he developed shortness of breath, wheezing, and high, he injected himself with epinephrine and given Solu-Medrol in the ED.  Patient was stabilized in the ED and discharged home.  Episode in 2019 required hospitalization and intubation due to tongue swelling and oral cavity swelling.  Patient was seen in allergy and immunology clinic and November 2019. At that time multiple allergy testing was ordered however patient was not able to get this done.  He is states that he used to  take heroin. He states that last use was heroin. He also used meth along with heroin which was last used 9 months. Both were snorted. He used to snort and he mentions that he noticed that inside of his nares are erythematous and sore occasionally. Used to take suboxone but one week ago he switched to methadone. [facility]   Patient has a history of VTE. Last blood clot was 6 months ago. Has not taken xarelto in the past 1 year due to being incarcerated, moving around often. He does not see a PCP and needs one in the area.   In the ED patient was noted to be afebrile, regular respiratory rate and heart rate, normotensive.  CBC was unremarkable, platelets are mildly decreased at 144.  BMP showed a potassium of 3.2.  She was treated with tranexmic acid, famotidine. Given his methadone and zoloft.   Meds:  Current Meds  Medication Sig  . EPINEPHrine (EPIPEN 2-PAK) 0.3 mg/0.3 mL IJ SOAJ injection Inject 0.3 mLs (0.3 mg total) into the muscle once as needed for up to 1 dose (for severe allergic reaction). CAll 911 immediately if you have to use this medicine  . methadone (DOLOPHINE) 10 MG/ML solution Take 40 mg by mouth daily.  Marland Kitchen. omeprazole (PRILOSEC) 40 MG capsule  Take 1 capsule (40 mg total) by mouth 2 (two) times daily.  . sertraline (ZOLOFT) 100 MG tablet Take 100 mg by mouth daily.   . traZODone (DESYREL) 100 MG tablet Take 100 mg by mouth at bedtime.   . ZUBSOLV 5.7-1.4 MG SUBL Place 1 tablet under the tongue 2 (two) times a day.      Allergies: Allergies as of 12/02/2018 - Review Complete 12/02/2018  Allergen Reaction Noted  . Ioxaglate Shortness Of Breath and Other (See Comments) 07/16/2014  . Ivp dye [iodinated diagnostic agents] Shortness Of Breath and Other (See Comments) 11/22/2011  . Shellfish allergy  04/17/2018   Past Medical History:  Diagnosis Date  . Carpal tunnel syndrome on both sides 03/28/2013  . Chronic headache   . DVT (deep venous thrombosis)  (HCC)    2017-2018: 4 dvts  . GERD (gastroesophageal reflux disease)   . Helicobacter pylori gastritis 06/30/06  . 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 Pulmonary embolism (HCC)    2013: 2 total  . TIA (transient ischemic attack)     Family History: No significant family history, mother has dementia, denies any allergic diseases in the family.  Social History: Denies any smoking, reports heroin and meth use, he was able to quit 9 months ago and has not done anything since.  Is currently in recovery.  Was taking Suboxone but has switched to methadone and is currently on 40 mg methadone daily.  Goes to crosswords.  Review of Systems: A complete ROS was negative except as per HPI.   Physical Exam: Blood pressure 108/71, pulse 68, temperature 98 F (36.7 C), temperature source Axillary, resp. rate 14, height 6\' 4"  (1.93 m), weight 119.1 kg, SpO2 94 %. Physical Exam  Constitutional: He is oriented to person, place, and time and well-developed, well-nourished, and in no distress.  HENT:  Head: Normocephalic and atraumatic.  Mildly swollen tongue  Eyes: Pupils are equal, round, and reactive to light. Conjunctivae and EOM are normal.  Neck: Normal range of motion. Neck supple. No thyromegaly present.  Cardiovascular: Normal rate, regular rhythm and normal heart sounds.  Pulmonary/Chest: Effort normal and breath sounds normal.  Abdominal: Soft. Bowel sounds are normal. He exhibits no distension.  Musculoskeletal: Normal range of motion.        General: No edema.  Neurological: He is alert and oriented to person, place, and time.  Skin: Skin is dry.  Warm, dry, after pressure patient develops a raised, erythematous area (dariers sign)  Psychiatric: Mood and affect normal.    Assessment & Plan by Problem: Active Problems:   Angioedema   Angioedema: Today patient presented after having tongue swelling, shortness of breath, and hives.  He has had 2 similar episodes in the past, one  in November 2019 and 1 about 3 weeks ago.  The episode in 2019 had no known precipitating factors, he had been eating red meat the night before and had recently started his Suboxone within 4 months of that episode.  He saw the allergy and immunologist, Dr. Lucie LeatherKozlow, and he had multiple tests ordered at that time, including allergen hymenopter, allergen fire ant, tryptase, C4 complement, and alpha gal panel.  However patient not get these done.  The episode 3 weeks ago occurred after mowing his lawn, patient denies any precipitating factor.  Patient only reports that he was mowing his lawn yesterday, and was eating barbecue last night, denies any other changes in his medications, changes in his detergent, or other changes in his environment.  He received 2 doses of epinephrine prior to arrival.  His hives and tongue swelling has improved now.  No hives or rashes noted exam.  Patient is protecting his airway and does not require any immediate intubation.  We will continue to monitor for now and ensure improvement. Unclear etiology at this time, will need further work up outpatient.   -Continue to monitor overnight -Telemetry -Bedside swallow eval was normal, will place diet order -CBC and BMP in AM -Patient will need allergy follow up outpatient , need to complete the ordered test  Hypokalemia: -Potassium down to 3.2.  Unclear of the cause.  Will replete and monitor for now.  -BMP in AM  History of DVT: -Patient is supposed to be on Xarelto, he states that he does not take any medications due to not having any PCP.  -Start Alen Blew per pharmacy -Social worker to assist with PCP  GERD: -On omeprazole 40 mg at home, continue this.   Substance use disorder: -Has a history of heroin use and methamphetamine use. Is on methadone 40 mg daily.  Patient reports that he has been clean for 9 months. -Continue methadone 40 mg daily  FEN: No fluids, replete lytes prn, HH diet  VTE ppx: Lovenox  Code  Status: FULL    Dispo: Admit patient to Observation with expected length of stay less than 2 midnights.  Signed: Asencion Noble, MD 12/02/2018, 4:13 PM  Pager: [phone]

## 2018-12-02 NOTE — Progress Notes (Signed)
ANTICOAGULATION CONSULT NOTE - Initial Consult  Pharmacy Consult for Xarelto Indication: DVT (history)  Allergies  Allergen Reactions  . Ioxaglate Shortness Of Breath and Other (See Comments)    dizziness  . Ivp Dye [Iodinated Diagnostic Agents] Shortness Of Breath and Other (See Comments)    dizziness  . Shellfish Allergy     Patient Measurements: Height: 6\' 4"  (193 cm) Weight: 262 lb 9.1 oz (119.1 kg) IBW/kg (Calculated) : 86.8  Vital Signs: Temp: 98.8 F (37.1 C) (06/28 2035) Temp Source: Oral (06/28 2035) BP: 116/66 (06/28 2035) Pulse Rate: 67 (06/28 2035)  Labs: Recent Labs    12/02/18 0621  HGB 14.9  HCT 46.0  PLT 144*  CREATININE 1.03    Estimated Creatinine Clearance: 125 mL/min (by C-G formula based on SCr of 1.03 mg/dL).   Medical History: Past Medical History:  Diagnosis Date  . Carpal tunnel syndrome on both sides 03/28/2013  . Chronic headache   . DVT (deep venous thrombosis) ([place])    2017-2018: 4 dvts  . GERD (gastroesophageal reflux disease)   . Helicobacter pylori gastritis 06/30/06  . 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 Pulmonary embolism ([place])    2013: 2 total  . TIA (transient ischemic attack)     Medications:  Medications Prior to Admission  Medication Sig Dispense Refill Last Dose  . EPINEPHrine (EPIPEN 2-PAK) 0.3 mg/0.3 mL IJ SOAJ injection Inject 0.3 mLs (0.3 mg total) into the muscle once as needed for up to 1 dose (for severe allergic reaction). CAll 911 immediately if you have to use this medicine 1 Device 1 unknown  . methadone (DOLOPHINE) 10 MG/ML solution Take 40 mg by mouth daily.   12/01/2018 at Unknown time  . omeprazole (PRILOSEC) 40 MG capsule Take 1 capsule (40 mg total) by mouth 2 (two) times daily. 60 capsule 1 Past Month at Unknown time  . sertraline (ZOLOFT) 100 MG tablet Take 100 mg by mouth daily.   1 Past Month at Unknown time  . traZODone (DESYREL) 100 MG tablet Take 100 mg by mouth at bedtime.   1 Past Month  at Unknown time  . ZUBSOLV 5.7-1.4 MG SUBL Place 1 tablet under the tongue 2 (two) times a day.   0 Past Month at Unknown time  . doxycycline (VIBRAMYCIN) 100 MG capsule Take 1 capsule (100 mg total) by mouth 2 (two) times daily. (Patient not taking: Reported on 12/02/2018) 28 capsule 0 Completed Course at Unknown time  . famotidine (PEPCID) 40 MG tablet Take 1 tablet (40 mg total) by mouth daily. (Patient not taking: Reported on 11/19/2018) 30 tablet 5 Not Taking at Unknown time  . predniSONE (DELTASONE) 20 MG tablet Take 40 mg by mouth daily for 3 days, then 20mg  by mouth daily for 3 days, then 10mg  daily for 3 days (Patient not taking: Reported on 12/02/2018) 12 tablet 0 Completed Course at Unknown time  . propranolol ER (INDERAL LA) 80 MG 24 hr capsule TAKE ONE CAPSULE EVERY DAY (Patient not taking: Reported on 11/19/2018) 30 capsule 0 Not Taking at Unknown time    Assessment: 48 yo M with hx of multiple DVTs.  Pt has not taken Xarelto for the last year due to multiple social complications.  Pharmacy has been asked to resume anticoagulation with Xarelto.  Given no acute DVT, will start at maintenance dose.    Goal of Therapy:  Therapeutic Anticoagulation Monitor platelets by anticoagulation protocol: Yes   Plan:  Xarelto 20mg  daily with dinner. Anticipate no  further dose adjustments. Rx will sign off.  Toys 'R' UsKimberly Imraan Wendell, Pharm.D., BCPS Clinical Pharmacist  **Pharmacist phone directory can now be found on [email or web address] ([email or web address]).  Listed under [facility].  12/02/2018 10:05 PM

## 2018-12-02 NOTE — ED Provider Notes (Signed)
I received this patient in signout from Dr. Leonides Schanz.  He had presented with angioedema, history of the same with previous intubation.  He had received Benadryl, Solu-Medrol, and epi by EMS prior to arrival and was given Pepcid, TXA  here. On re-examination, his tongue remains edematous 4 hours after treatment. He states mild improvement but is still having altered speech. Given significant h/o angioedema and ongoing sx, recommended obs admission. Discussed w/ internal med teaching service.   Timothy [name], Wenda Overland, MD 12/02/18 [phone]

## 2018-12-03 DIAGNOSIS — T783XXA Angioneurotic edema, initial encounter: Secondary | ICD-10-CM | POA: Diagnosis not present

## 2018-12-03 DIAGNOSIS — F329 Major depressive disorder, single episode, unspecified: Secondary | ICD-10-CM

## 2018-12-03 DIAGNOSIS — Z91013 Allergy to seafood: Secondary | ICD-10-CM

## 2018-12-03 DIAGNOSIS — E876 Hypokalemia: Secondary | ICD-10-CM | POA: Diagnosis not present

## 2018-12-03 DIAGNOSIS — F1511 Other stimulant abuse, in remission: Secondary | ICD-10-CM | POA: Diagnosis not present

## 2018-12-03 LAB — BASIC METABOLIC PANEL
Anion gap: 11 (ref 5–15)
BUN: 16 mg/dL (ref 6–20)
CO2: 22 mmol/L (ref 22–32)
Calcium: 9.3 mg/dL (ref 8.9–10.3)
Chloride: 108 mmol/L (ref 98–111)
Creatinine, Ser: 0.89 mg/dL (ref 0.61–1.24)
GFR calc Af Amer: >60 mL/min (ref >60)
GFR calc non Af Amer: >60 mL/min (ref >60)
Glucose, Bld: 113 mg/dL — ABNORMAL HIGH (ref 70–99)
Potassium: 4.5 mmol/L (ref 3.5–5.1)
Sodium: 141 mmol/L (ref 135–145)

## 2018-12-03 LAB — CBC
HCT: 40.4 % (ref 39.0–52.0)
Hemoglobin: 13.1 g/dL (ref 13.0–17.0)
MCH: 30.1 pg (ref 26.0–34.0)
MCHC: 32.4 g/dL (ref 30.0–36.0)
MCV: 92.9 fL (ref 80.0–100.0)
Platelets: 159 10*3/uL (ref 150–400)
RBC: 4.35 MIL/uL (ref 4.22–5.81)
RDW: 13.9 % (ref 11.5–15.5)
WBC: 7.1 10*3/uL (ref 4.0–10.5)
nRBC: 0 % (ref 0.0–0.2)

## 2018-12-03 MED ORDER — SERTRALINE HCL 100 MG PO TABS: 100.0000 mg | ORAL_TABLET | Freq: Every day | ORAL | 1 refills | Status: DC

## 2018-12-03 MED ORDER — EPINEPHRINE 0.3 MG/0.3ML IJ SOAJ: 0.3000 mg | Freq: Once | INTRAMUSCULAR | 0 refills | Status: DC | PRN

## 2018-12-03 MED ORDER — RIVAROXABAN 20 MG PO TABS: 20.0000 mg | ORAL_TABLET | Freq: Every day | ORAL | 0 refills | Status: DC

## 2018-12-03 MED FILL — XARELTO 20 MG TABLET: 20 | 30 days supply | Qty: 30 | Fill #0

## 2018-12-03 MED FILL — SERTRALINE HCL 100 MG TAB: 100 | 30 days supply | Qty: 30 | Fill #0

## 2018-12-03 NOTE — Progress Notes (Signed)
Date: 12/03/2018  Patient name: Timothy [name]  Medical record number: [id number]  Date of birth: 1970/09/27   I have seen and evaluated Timothy [name] and discussed their care with the Residency Team.  In brief, patient is a 48 year old male with past medical history of angioedema, substance abuse (heroin and methamphetamines) presented to the ED yesterday morning with tongue swelling, shortness of breath and hives.  Patient states that he woke up yesterday morning at approximately 4 AM and felt like his tongue was swollen and he had hives on his arms as well as over his stomach.  Patient had a similar episode approximately 3 weeks ago and had another episode 1 year ago.  Patient states that on the day prior to admission he mowed his lawn and he also had barbecue for dinner.  Patient's episode of 1 year ago required hospitalization and intubation secondary to tongue swelling.  He was seen in allergy and immunology clinic but did not follow-up to get his testing done.  Today patient states that he feels well and denies any new complaints.  He states that his hives and shortness of breath and tongue swelling have all resolved.  PMHx, Fam Hx, and/or Soc Hx : As per resident admit note  Vitals:   12/02/18 2035 12/03/18 0440  BP: 116/66 122/64  Pulse: 67 60  Resp: 17 18  Temp: 98.8 F (37.1 C) 98.5 F (36.9 C)  SpO2: 94% 95%   Physical Exam  Constitutional: He is oriented to person, place, and time and well-developed, well-nourished, and in no distress.  HENT:  Head: Normocephalic and atraumatic.  Eyes: Right eye exhibits no discharge. Left eye exhibits no discharge.  Cardiovascular: Normal rate, regular rhythm and normal heart sounds.  Pulmonary/Chest: Effort normal and breath sounds normal. No respiratory distress. He has no wheezes. He has no rales.  Abdominal: Soft. Bowel sounds are normal. He exhibits no distension. There is no abdominal tenderness.  Musculoskeletal:        General:  No tenderness or edema.  Neurological: He is alert and oriented to person, place, and time.  Skin: Skin is warm and dry. No rash noted.  Psychiatric: Mood and affect normal.    Assessment and Plan: I have seen and evaluated the patient as outlined above. I agree with the formulated Assessment and Plan as detailed in the residents' note, with the following changes:   1.  Recurrent angioedema: -Patient presented to ED with tongue swelling, shortness of breath and hives.  This is his third episode in the last year.  There is no clear etiology for his recurrent angioedema at this time.  It may be related to an allergy to red meat given that he ate red meat before to these episodes. -Patient symptoms have now resolved -Patient will need testing for tryptase, alpha gal, C4 levels as an outpatient -Patient will need refill of his epinephrine shots to keep with him at all times in case of recurrent symptoms -Patient will need follow-up with allergy and immunology as an outpatient -Patient to follow-up with [facility] on discharge. -Of note, patient does have a history of DVT/PE but is not been compliant with his Xarelto at home.  We will have patient resume Xarelto for now. -Patient also with history of substance use disorder and is on methadone 40 mg daily.  We will continue this for now -If patient continues remain stable he should be stable for DC home later today  Aldine Contes, MD 6/29/20201:39 PM

## 2018-12-03 NOTE — Discharge Instructions (Addendum)
Timothy [name],   It has been a pleasure working with you and we are glad you're feeling better. You were hospitalized for an allergic reaction, you were treated with steroids and epineprhine. It is unclear what caused your symptoms and it will be important for you to follow up with the allergist for any further testing. It's possible that this may be an allergy to red meat, please try to avoid this for now until you follow up with the allergist.   For your history of blood clots you are suppose to be on Xeralto daily, I have sent in a prescription for this medication. Please follow up with your primary care provider for further treatment.   For your depression I have sent in a prescription for sertraline, please follow up with your primary care provider for further treatment.  During your hospitalization you were given your methadone dose on 6/28 and 6/29  Follow up with your primary care provider in 1-2 weeks Please follow up with the allergist and immunologist  If your symptoms worsen or you develop new symptoms, please seek medical help whether it is your primary care provider or emergency department.   Angioedema  Angioedema is sudden swelling in the body. The swelling can happen in any part of the body. It often happens on the skin and causes itchy, bumpy patches (hives) to form. This condition may:  Happen only one time.  Happen more than one time. It may come back at random times.  Keep coming back for a number of years. Someday it may stop coming back. Follow these instructions at home:  Take over-the-counter and prescription medicines only as told by your doctor.  If you were given medicines for emergency allergy treatment, always carry them with you.  Wear a medical bracelet as told by your doctor.  Avoid the things that cause your attacks (triggers).  If this condition was passed to you from your parents and you want to have kids, talk to your doctor. Your kids may also  have this condition. Contact a doctor if:  You have another attack.  Your attacks happen more often, even after you take steps to prevent them.  This condition was passed to you by your parents and you want to have kids. Get help right away if:  Your mouth, tongue, or lips get very swollen.  You have trouble breathing.  You have trouble swallowing.  You pass out (faint). This information is not intended to replace advice given to you by your health care provider. Make sure you discuss any questions you have with your health care provider. Document Released: 05/11/2009 Document Revised: 05/05/2017 Document Reviewed: 12/01/2015 Elsevier Patient Education  2020 [employer].

## 2018-12-03 NOTE — Progress Notes (Signed)
Discharge: Pt d/c from room via wheelchair, Family member with the pt. Discharge instructions given to the patient and family members.  No questions from pt, reintegrated to the pt to call or go to the ED for chest discomfort. Pt dressed in street clothes and left with discharge papers and prescriptions in hand. IV d/ced, tele removed and no complaints of pain or discomfort. 

## 2018-12-03 NOTE — Progress Notes (Signed)
Pt alert and oriented x4, no complaints of pain or discomfort.  Bed in low position, call bell within reach.  Bed alarms on and functioning.  Assessment done and charted.  Will continue to monitor and do hourly rounding throughout the shift 

## 2018-12-03 NOTE — Progress Notes (Signed)
Subjective:   The patient stated that he is feeling better. He feels that his tongue swelling has decreased and that his hives have also decreased.  Denies any shortness of breath, diarrhea, constipation, or other rashes.  Objective:  Vital signs in last 24 hours: Vitals:   12/02/18 1434 12/02/18 2035 12/03/18 0440 12/03/18 0500  BP: 108/71 116/66 122/64   Pulse: 68 67 60   Resp: 14 17 18    Temp: 98 F (36.7 C) 98.8 F (37.1 C) 98.5 F (36.9 C)   TempSrc: Axillary Oral Oral   SpO2: 94% 94% 95%   Weight:    119.1 kg  Height:        General: Appearing male, no acute distress, lying in bed Cardiac: Either rate and rhythm, no murmurs rubs or gallops, no lower extremity edema Pulmonary: CTA BL, no wheezing, rhonchi, or rales Abdomen:, Nontender, nondistended normoactive bowel sounds   Assessment/Plan:  Active Problems:   Angioedema   Urticaria  This is a 48 year old male with a history of primary GI, substance use disorder, history of DVT/PE and Klinefelter syndrome who presented after an episode of tongue swelling, shortness of breath, swelling, and hives.  No known to precipitating factors, he does report mowing the lawn the day prior to this episode and eating barbecue.  Patient has followed with allergy and immunology in the past and most all lab tests were ordered on his last visit, however these are not done.  Angioedema: Patient received 2 doses of epinephrine and solu-medrol prior to arriving in the ED. He received tranexamic acid and famotadine in the ED. Patient had significant improvement in his tongue swelling, hives, and shortness of breath following this. Today he reports significant improvement in the hives and tongue swelling, he denies any issues with swallowing, shortness of breath, or other symptoms at this time.  Her to later this evening to ensure no recurrence of anaphylactic reaction.  Patient will need to have further evaluation for allergies outpatient,  will recommend to follow-up with the allergy and immunology specialist for further testing.  Given his history of tick bites and possible connection to the red meats, it is possible that this could be an alpha gal allergy.  Will defer further testing to outpatient work-up.  Patient also seems to have a increased level of histamine, did have a positive Darier sign on initial exam, could be indicative mastocytosis or could also just be related to his reaction.  Vitals have been stable, CBC and BMP have been unremarkable.  -No further testing inpatient -Follow-up with allergy and immunology -[facility]  Hypokalemia: Potassium of 3.2 on admission, today does not improve to 4.5.  No further testing at this time. -BMP on hospital follow-up  Substance use disorder: -Reports that he has been clean for 9 months, encouraged continued cessation. -Continue methadone 40 mg daily  History of DVT/PE: -Started Xarelto per pharmacy consult, on 20 mg daily.  Per chart review it appears that this is a lifelong requirement.  Patient does not have a PCP to follow-up with and is unable to obtain any medications.  -Continue Xarelto 20 mg daily -Care management to assist with medications  Depression: -Patient reported that he had been on sertraline in the past, has not been taking it for a while. Will resume this and make sure that he has prescription on discharge -Continue sertraline 100 mg daily  FEN: No fluids, replete lytes prn, regular diet  VTE ppx: Xeralto Code Status: FULL  Dispo: Anticipated discharge in approximately today.   Asencion Noble, MD 12/03/2018, 6:53 AM Pager: [phone]

## 2018-12-03 NOTE — Discharge Summary (Signed)
Name: Timothy [name] MRN: [id number] DOB: 05-Dec-1970 48 y.o. PCP: Patient, No Pcp Per  Date of Admission: 12/02/2018  6:02 AM Date of Discharge: 12/03/2018 12/03/18 Attending Physician: Dr. Earl LagosNischal [name]  Discharge Diagnosis: 1. Angioedema 2. History of DVT/PE 3. Substance use disorder   Discharge Medications: Allergies as of 12/03/2018      Reactions   Ioxaglate Shortness Of Breath, Other (See Comments)   dizziness   Ivp Dye [iodinated Diagnostic Agents] Shortness Of Breath, Other (See Comments)   dizziness   Shellfish Allergy       Medication List    STOP taking these medications   doxycycline 100 MG capsule Commonly known as: VIBRAMYCIN   famotidine 40 MG tablet Commonly known as: PEPCID   predniSONE 20 MG tablet Commonly known as: DELTASONE   propranolol ER 80 MG 24 hr capsule Commonly known as: INDERAL LA   traZODone 100 MG tablet Commonly known as: DESYREL   Zubsolv 5.7-1.4 MG Subl Generic drug: Buprenorphine HCl-Naloxone HCl     TAKE these medications   EPINEPHrine 0.3 mg/0.3 mL Soaj injection Commonly known as: EpiPen 2-Pak Inject 0.3 mLs (0.3 mg total) into the muscle once as needed for up to 1 dose (for severe allergic reaction). CAll 911 immediately if you have to use this medicine   methadone 10 MG/ML solution Commonly known as: DOLOPHINE Take 40 mg by mouth daily.   omeprazole 40 MG capsule Commonly known as: PRILOSEC Take 1 capsule (40 mg total) by mouth 2 (two) times daily.   rivaroxaban 20 MG Tabs tablet Commonly known as: XARELTO Take 1 tablet (20 mg total) by mouth daily with supper.   sertraline 100 MG tablet Commonly known as: ZOLOFT Take 1 tablet (100 mg total) by mouth daily.       Disposition and follow-up:   Mr.Timothy [name] [facility] St[facility] condition.  At the hospital follow up visit please address:  1.  Recurrent angioedema: should follow with allergy and immunology get  tryptase, alpha gal, c4 testing done. Please make sure patient has enough epipens.  Hx DVT/PE: continue xarelto  Substance use disorder: [facility] dosing  2.  Labs / imaging needed at time of follow-up: tryptase, alpha gal, c4  3.  Pending labs/ test needing follow-up: none  Follow-up Appointments: Follow-up Information    Timothy SeveranceKrienke, Marissa M, MD. Go to.   Specialty: Internal Medicine Why: July 2nd at 9:15 Contact information: [address] [place] [address] [phone]        [facility] [place]. Schedule an appointment as soon as possible for a visit in 1 week(s).   Specialty: Allergy and Asthma  Contact information: [address] [place] [id number] [phone]          Hospital Course by problem list: 1. Recurrent Angioedema Patient presented with tongue swelling, shortness of breath, hives over his upper extremities, chest, and abdomen.  He had two prior episodes similar to this (November 2019 requiring intubation secondary to tongue swelling and 3 weeks prior to this admission). He was admitted into the hospital for close monitoring to ensure that he was maintaining his airway and that his symptoms were controlled. He got two doses of epinephrine prior to admission, was given tranexamic acid in ED.   On day of discharge he did not have hives or rashes and his dermatographism had improved.  Etiology of the patient's symptoms were not clear. Patient  mentioned that he had mowed his law and eaten barbecue the day prior to admission. He should get a full allergy and immunology workup as an outpatient with particular consideration to alpha gal, tryptase, C4. We refilled his epinephrine.   2. History of DVT/PE Patient has a history of DVT (unknown when-?6 months ago) and is supposed to be on Xarelto but he is not been getting his medications as he does not have a PCP at this time.  Patient was given Xarelto at  discharge.  3. Substance use disorder  Patient has been taking methadone 40mg  qd as prescribed by [facility]. He was given methadone on 6/28 and 6/29.   Discharge Vitals:   BP 122/73 (BP Location: Right Arm)   Pulse (!) 56   Temp 98.3 F (36.8 C) (Oral)   Resp 18   Ht 6\' 4"  (1.93 [name])   Wt 119.1 kg   SpO2 96%   BMI 31.96 kg/[name]   Pertinent Labs, Studies, and Procedures:  CBC Latest Ref Rng & Units 12/03/2018 12/02/2018 04/17/2018  WBC 4.0 - 10.5 K/uL 7.1 6.7 9.1  Hemoglobin 13.0 - 17.0 g/dL 13.1 14.9 12.7(L)  Hematocrit 39.0 - 52.0 % 40.4 46.0 40.0  Platelets 150 - 400 K/uL 159 144(L) 148(L)   BMP Latest Ref Rng & Units 12/03/2018 12/02/2018 04/17/2018  Glucose 70 - 99 mg/dL 113(H) 160(H) 184(H)  BUN 6 - 20 mg/dL 16 14 20   Creatinine 0.61 - 1.24 mg/dL 0.89 1.03 0.88  BUN/Creat Ratio 9 - 20 - - -  Sodium 135 - 145 mmol/L 141 143 140  Potassium 3.5 - 5.1 mmol/L 4.5 3.2(L) 4.0  Chloride 98 - 111 mmol/L 108 105 110  CO2 22 - 32 mmol/L 22 26 23   Calcium 8.9 - 10.3 mg/dL 9.3 8.9 8.8(L)     Discharge Instructions: Discharge Instructions    Call MD for:  difficulty breathing, headache or visual disturbances   Complete by: As directed    Call MD for:  difficulty breathing, headache or visual disturbances   Complete by: As directed    Call MD for:  extreme fatigue   Complete by: As directed    Call MD for:  hives   Complete by: As directed    Call MD for:  hives   Complete by: As directed    Call MD for:  persistant dizziness or light-headedness   Complete by: As directed    Call MD for:  persistant dizziness or light-headedness   Complete by: As directed    Call MD for:  persistant nausea and vomiting   Complete by: As directed    Call MD for:  redness, tenderness, or signs of infection (pain, swelling, redness, odor or green/yellow discharge around incision site)   Complete by: As directed    Call MD for:  redness, tenderness, or signs of infection (pain,  swelling, redness, odor or green/yellow discharge around incision site)   Complete by: As directed    Call MD for:  severe uncontrolled pain   Complete by: As directed    Call MD for:  temperature >100.4   Complete by: As directed    Diet - low sodium heart healthy   Complete by: As directed    Increase activity slowly   Complete by: As directed    Increase activity slowly   Complete by: As directed       Signed: Lars Mage, MD 12/06/2018, 7:56 AM   Pager: [phone]

## 2018-12-04 ENCOUNTER — Telehealth: Payer: Self-pay

## 2018-12-04 LAB — NOVEL CORONAVIRUS, NAA (HOSP ORDER, SEND-OUT TO REF LAB; TAT 18-24 HRS): SARS-CoV-2, NAA: NOT DETECTED

## 2018-12-04 MED FILL — AMOXICILLIN 500 MG CAPSULE: 500 | 7 days supply | Qty: 21 | Fill #0

## 2018-12-04 NOTE — Telephone Encounter (Signed)
Patients states he got the COVID-19 test in the ED, he would like the results sent to his dentist office. Please call pt back.

## 2018-12-04 NOTE — Telephone Encounter (Signed)
Results not back yet, informed pt he may get a copy at his appt 7/2 in [facility], he is agreeable

## 2018-12-05 MED FILL — EPINEPHRINE 0.3 MG AUTO-INJ: 0.3 | 2 days supply | Qty: 2 | Fill #0

## 2018-12-05 MED FILL — OMEPRAZOLE 40 MG CPDR: 40 | 30 days supply | Qty: 60 | Fill #1

## 2018-12-06 ENCOUNTER — Encounter: Payer: Self-pay | Admitting: Internal Medicine

## 2018-12-06 ENCOUNTER — Ambulatory Visit (INDEPENDENT_AMBULATORY_CARE_PROVIDER_SITE_OTHER): Payer: 59 | Admitting: Internal Medicine

## 2018-12-06 ENCOUNTER — Other Ambulatory Visit: Payer: Self-pay

## 2018-12-06 VITALS — BP 134/90 | HR 85 | Temp 99.0°F | Ht 76.0 in | Wt 260.4 lb

## 2018-12-06 DIAGNOSIS — Z86718 Personal history of other venous thrombosis and embolism: Secondary | ICD-10-CM

## 2018-12-06 DIAGNOSIS — F339 Major depressive disorder, recurrent, unspecified: Secondary | ICD-10-CM | POA: Diagnosis not present

## 2018-12-06 DIAGNOSIS — I2782 Chronic pulmonary embolism: Secondary | ICD-10-CM

## 2018-12-06 DIAGNOSIS — R21 Rash and other nonspecific skin eruption: Secondary | ICD-10-CM

## 2018-12-06 DIAGNOSIS — T783XXD Angioneurotic edema, subsequent encounter: Secondary | ICD-10-CM

## 2018-12-06 DIAGNOSIS — Z79899 Other long term (current) drug therapy: Secondary | ICD-10-CM

## 2018-12-06 DIAGNOSIS — Z86711 Personal history of pulmonary embolism: Secondary | ICD-10-CM

## 2018-12-06 DIAGNOSIS — T783XXA Angioneurotic edema, initial encounter: Secondary | ICD-10-CM

## 2018-12-06 DIAGNOSIS — F329 Major depressive disorder, single episode, unspecified: Secondary | ICD-10-CM

## 2018-12-06 DIAGNOSIS — K219 Gastro-esophageal reflux disease without esophagitis: Secondary | ICD-10-CM | POA: Insufficient documentation

## 2018-12-06 DIAGNOSIS — I2699 Other pulmonary embolism without acute cor pulmonale: Secondary | ICD-10-CM | POA: Insufficient documentation

## 2018-12-06 MED ORDER — SERTRALINE HCL 100 MG PO TABS: 100.0000 mg | ORAL_TABLET | Freq: Every day | ORAL | 1 refills | Status: DC

## 2018-12-06 MED ORDER — OMEPRAZOLE 40 MG PO CPDR: 40.0000 mg | DELAYED_RELEASE_CAPSULE | Freq: Two times a day (BID) | ORAL | 1 refills | Status: DC

## 2018-12-06 MED ORDER — RIVAROXABAN 20 MG PO TABS: 20.0000 mg | ORAL_TABLET | Freq: Every day | ORAL | 1 refills | Status: DC

## 2018-12-06 NOTE — Patient Instructions (Addendum)
It was our pleasure taking care of you in our clinic today.  You came in for a hospital follow up.  I am glad you feel better.   Make sure to carry 1 Epipen with you all the time. Avoid probable allergen (try to avoid going to your garden unless you see Allergy-immunology doctor). Send [place] (or ask the phramacy) refill request for Epipen when you have only 1 or 2 left.  Make sure to follow your appointment with alergy immunology doctor on 7/11.  I send refill for Xarelto, Prilosec, and Zoloft. Please take them as instructed before.  (make sure to take xarelto every day because you had history or DVT and PE (clot in our leg and lung) and you need to take Xarelto to prevent another clot formation in your body.  Contact us if you have any question or concern.  Please come back to clinic in 1 month or earlier if your symptoms get worse or not improved. As always, if having severe symptoms, please seek medical attention at emergency room.  Thanks, Dr. Linna Hoff

## 2018-12-06 NOTE — Assessment & Plan Note (Addendum)
Patient presented for hospital follow up. (He was hospitalized due to angioedema that required intubation). He denies any further episode of swelling or difficulty breathing.  He still had few mild rash on his abdomen and his arm with itching since last hospitalization but otherwise has been doing well.   He confirmes that he has 4 Epi pen at home and he always carries one of them in his pocket. Has an appointment with allergy-immunologist on  7/11.   -F/u with allergy-immunologist on  7/11 -Avoid going to the garden and other possible allergens -Recommended to ask for refill on Epi pen before running out of it -Return to clinic in a month or sooner if needed (he would like to establish care in clinic)

## 2018-12-06 NOTE — Assessment & Plan Note (Addendum)
Patient has a history of PE and DVT (unknown when- maybe 6 months ago?) Supposed to be on Xarelto but has been off of it for a while, because he did not have PCP at this time and was not able to get refills. It seems like patient was given Xarelto at discharge last hospitalization but he mentions that he does not have so many pills.  -Sent refill for xarelto 20 mg QD

## 2018-12-06 NOTE — Progress Notes (Signed)
   CC: Hospital follow up for angioedema  HPI:  Mr.Mehtaab L Kriesel is a 48 y.o. .  Past Medical History:  Diagnosis Date  . Carpal tunnel syndrome on both sides 03/28/2013  . Chronic headache   . DVT (deep venous thrombosis) ([place])    2017-2018: 4 dvts  . GERD (gastroesophageal reflux disease)   . Helicobacter pylori gastritis 06/30/06  . 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 Pulmonary embolism ([place])    2013: 2 total  . TIA (transient ischemic attack)    Review of Systems:  Review of Systems  Respiratory: Negative for cough, shortness of breath and wheezing.   Cardiovascular: Negative for chest pain and leg swelling.  Gastrointestinal: Positive for heartburn. Negative for abdominal pain, diarrhea, nausea and vomiting.  Skin: Positive for rash. Negative for itching.  Neurological: Negative for dizziness and headaches.    Physical Exam:  Vitals:   12/06/18 0938 12/06/18 0949  BP: (!) 150/88 134/90  Pulse: 84 85  Temp: 99 F (37.2 C)   TempSrc: Oral   SpO2: 100%   Weight: 260 lb 6.4 oz (118.1 kg)   Height: 6\' 4"  (1.93 m)   Physical Exam  Assessment & Plan:   See Encounters Tab for problem based charting.  Patient discussed with Dr. Dareen Piano

## 2018-12-06 NOTE — Assessment & Plan Note (Signed)
-  Send refill for omeprazole

## 2018-12-06 NOTE — Assessment & Plan Note (Addendum)
Has been off of anti depression medications because he did not have PCP and was not able to ask for refill.  Now feels having low energy for doing what he needs to do some times.  Sleeps well. Appetite is ok. No suicidal ideation or thought. He mentions that Zoloft usually helps with his mood.  -Send refill for Zoloft 100 mg QD -F/u in clinic in a month or sooner as needed

## 2018-12-06 NOTE — Assessment & Plan Note (Deleted)
efgb

## 2018-12-10 NOTE — Progress Notes (Signed)
Internal Medicine Clinic Attending  Case discussed with Dr. Masoudi  at the time of the visit.  We reviewed the resident's history and exam and pertinent patient test results.  I agree with the assessment, diagnosis, and plan of care documented in the resident's note.  

## 2018-12-25 ENCOUNTER — Encounter: Payer: Self-pay | Admitting: Allergy and Immunology

## 2018-12-25 ENCOUNTER — Ambulatory Visit (INDEPENDENT_AMBULATORY_CARE_PROVIDER_SITE_OTHER): Payer: 59 | Admitting: Allergy and Immunology

## 2018-12-25 ENCOUNTER — Other Ambulatory Visit: Payer: Self-pay

## 2018-12-25 VITALS — BP 122/78 | HR 70 | Temp 98.1°F | Resp 18 | Ht 76.0 in | Wt 245.0 lb

## 2018-12-25 DIAGNOSIS — T783XXD Angioneurotic edema, subsequent encounter: Secondary | ICD-10-CM

## 2018-12-25 DIAGNOSIS — T782XXD Anaphylactic shock, unspecified, subsequent encounter: Secondary | ICD-10-CM | POA: Diagnosis not present

## 2018-12-25 MED ORDER — FAMOTIDINE 40 MG PO TABS: 40.0000 mg | ORAL_TABLET | Freq: Every day | ORAL | 5 refills | Status: DC

## 2018-12-25 NOTE — Progress Notes (Signed)
[place]   Follow-up Note  Referring Provider: No ref. provider found Primary Provider: Patient, No Pcp Per Date of Office Visit: 12/25/2018  Subjective:   Timothy [name] (DOB: January 26, 1971) is a 48 y.o. male who [facility]2018 in re-evaluation of the following:  HPI: Timothy [name] returns to this clinic in evaluation of recurrent allergic reaction.  I last saw him in this clinic during his initial evaluation of 19 April 2018 at which point in time he developed a reaction defined by urticaria and tongue swelling.  He was instructed to use an H1 and H2 receptor blocker on a consistent basis as a preventative for recurrent allergic reaction but he only did so for short period in time and came off this medication and did very well without any issues until the past month.  He has had 2 episodes of similar presentation with total body urticaria and intense itchiness and tongue swelling both treated with home delivery of an EpiPen and subsequent emergency room evaluation.  Both these reactions occurred 6 hours after weed eating.  However, he been weed eating many times during the spring with no difficulty at all.  He did not take any type of over-the-counter medication that could account for this reaction and he has not really been utilizing any medications on a regular basis that can account for this reaction.  There was a question of whether or not he might be having reactivity directed against Suboxone but he is no longer on that medication at this point.  Allergies as of 12/25/2018      Reactions   Ioxaglate Shortness Of Breath, Other (See Comments)   dizziness   Ivp Dye [iodinated Diagnostic Agents] Shortness Of Breath, Other (See Comments)   dizziness   Shellfish Allergy       Medication List       EPINEPHrine 0.3 mg/0.3 mL Soaj injection Commonly known as: EpiPen 2-Pak Inject 0.3 mLs (0.3 mg total)  into the muscle once as needed for up to 1 dose (for severe allergic reaction). CAll 911 immediately if you have to use this medicine   methadone 10 MG/ML solution Commonly known as: DOLOPHINE Take 45 mg by mouth daily.   omeprazole 40 MG capsule Commonly known as: PRILOSEC Take 1 capsule (40 mg total) by mouth 2 (two) times daily.   rivaroxaban 20 MG Tabs tablet Commonly known as: XARELTO Take 1 tablet (20 mg total) by mouth daily with supper.   sertraline 100 MG tablet Commonly known as: ZOLOFT Take 1 tablet (100 mg total) by mouth daily.       Past Medical History:  Diagnosis Date  . Carpal tunnel syndrome on both sides 03/28/2013  . Chronic headache   . DVT (deep venous thrombosis) ([facility])    2017-2018: 4 dvts  . GERD (gastroesophageal reflux disease)   . Helicobacter pylori gastritis 06/30/06  . 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 Pulmonary embolism ([facility])    2013: 2 total  . TIA (transient ischemic attack)     Past Surgical History:  Procedure Laterality Date  . KNEE SURGERY Right 10/2016    Review of systems negative except as noted in HPI / PMHx or noted below:  Review of Systems  Constitutional: Negative.   HENT: Negative.   Eyes: Negative.   Respiratory: Negative.   Cardiovascular: Negative.   Gastrointestinal: Negative.   Genitourinary: Negative.   Musculoskeletal: Negative.  Skin: Negative.   Neurological: Negative.   Endo/Heme/Allergies: Negative.   Psychiatric/Behavioral: Negative.      Objective:   Vitals:   12/25/18 1753  BP: 122/78  Pulse: 70  Resp: 18  Temp: 98.1 F (36.7 C)  SpO2: 97%   Height: 6\' 4"  (193 cm)  Weight: 245 lb (111.1 kg)   Physical Exam Constitutional:      Appearance: He is not diaphoretic.  HENT:     Head: Normocephalic.     Right Ear: Tympanic membrane, ear canal and external ear normal.     Left Ear: Tympanic membrane, ear canal and external ear normal.     Nose: Nose normal. No mucosal edema or  rhinorrhea.     Mouth/Throat:     Pharynx: Uvula midline. No oropharyngeal exudate.  Eyes:     Conjunctiva/sclera: Conjunctivae normal.  Neck:     Thyroid: No thyromegaly.     Trachea: Trachea normal. No tracheal tenderness or tracheal deviation.  Cardiovascular:     Rate and Rhythm: Normal rate and regular rhythm.     Heart sounds: Normal heart sounds, S1 normal and S2 normal. No murmur.  Pulmonary:     Effort: No respiratory distress.     Breath sounds: Normal breath sounds. No stridor. No wheezing or rales.  Lymphadenopathy:     Head:     Right side of head: No tonsillar adenopathy.     Left side of head: No tonsillar adenopathy.     Cervical: No cervical adenopathy.  Skin:    Findings: No erythema or rash.     Nails: There is no clubbing.   Neurological:     Mental Status: He is alert.     Diagnostics: none  Assessment and Plan:   1. Anaphylaxis, subsequent encounter   2. Angioedema, subsequent encounter     1.  Use a combination of the following every day:   A.  Cetirizine 10 mg -1 tablet 1 time per day  B.  Famotidine 40 mg -1 tablet 1 time per day  2.  If needed: Epi-Pen, Benadryl, MD/ER evaluation for allergic reaction  3.  Review blood test results for November 2019  4.  Return to clinic in 4 weeks or earlier if problem  Obviously Timothy [name] is having immunological hyperreactivity of unknown etiologic factor.  We did have him obtain several blood test during his last visit in investigation of a systemic disease that may be contributing to this immunological hyperactivity.  Unfortunately, we cannot find the results of these blood tests and we are currently attempting to contact the laboratory to get these blood tests results.  Timothy [name] does remember receiving a phone call from our clinic telling him that all the blood tests were normal.  He needs to use an H1 and H2 receptor blocker on a consistent basis.  I will regroup with him in 4 weeks.  Allena Katz, MD Allergy  / Immunology [place]

## 2018-12-25 NOTE — Patient Instructions (Addendum)
  1.  Use a combination of the following every day:   A.  Cetirizine 10 mg -1 tablet 1 time per day  B.  Famotidine 40 mg -1 tablet 1 time per day  2.  If needed: Epi-Pen, Benadryl, MD/ER evaluation for allergic reaction  3.  Review blood test results for November 2019  4.  Return to clinic in 4 weeks or earlier if problem

## 2018-12-26 ENCOUNTER — Encounter: Payer: Self-pay | Admitting: Allergy and Immunology

## 2018-12-26 MED FILL — FAMOTIDINE 40 MG TABLET: 40 | 30 days supply | Qty: 30 | Fill #0

## 2019-01-15 ENCOUNTER — Other Ambulatory Visit: Payer: Self-pay

## 2019-01-15 ENCOUNTER — Encounter: Payer: Self-pay | Admitting: Internal Medicine

## 2019-01-15 ENCOUNTER — Ambulatory Visit (INDEPENDENT_AMBULATORY_CARE_PROVIDER_SITE_OTHER): Payer: 59 | Admitting: Internal Medicine

## 2019-01-15 VITALS — BP 114/74 | HR 68 | Temp 98.4°F | Ht 76.0 in | Wt 266.3 lb

## 2019-01-15 DIAGNOSIS — K219 Gastro-esophageal reflux disease without esophagitis: Secondary | ICD-10-CM

## 2019-01-15 DIAGNOSIS — Z79899 Other long term (current) drug therapy: Secondary | ICD-10-CM

## 2019-01-15 DIAGNOSIS — G250 Essential tremor: Secondary | ICD-10-CM

## 2019-01-15 DIAGNOSIS — R61 Generalized hyperhidrosis: Secondary | ICD-10-CM | POA: Diagnosis not present

## 2019-01-15 MED ORDER — OMEPRAZOLE 40 MG PO CPDR: 40.0000 mg | DELAYED_RELEASE_CAPSULE | Freq: Two times a day (BID) | ORAL | 1 refills | Status: DC

## 2019-01-15 MED ORDER — PROPRANOLOL HCL 10 MG PO TABS: 10.0000 mg | ORAL_TABLET | Freq: Every day | ORAL | 3 refills | Status: DC

## 2019-01-15 MED ORDER — PROPRANOLOL HCL ER 60 MG PO CP24: 60.0000 mg | ORAL_CAPSULE | Freq: Every day | ORAL | 3 refills | Status: DC

## 2019-01-15 MED ORDER — ALUMINUM CHLORIDE HEXAHYDRATE POWD: 0 refills | Status: DC

## 2019-01-15 MED FILL — PROPRANOLOL HCL 10 MG TAB: 10 | 30 days supply | Qty: 30 | Fill #0

## 2019-01-15 MED FILL — OMEPRAZOLE DR 40 MG CAPSULE: 40 | 30 days supply | Qty: 60 | Fill #0

## 2019-01-15 NOTE — Patient Instructions (Signed)
Timothy [name],   It was a pleasure taking care of your the clinic today.  Overall, seems to be that you are doing well and I will refill the Prilosec and the propranolol.  However it seems that you are taking a higher dose of propranolol at 80 mg.  Your blood pressure has been a little lower in the past and today it is 114/74 and your pulse is 68.  I do get worried with my patients who take blood pressure medicines and have low blood pressures.  If you take this medication and you feel dizzy I would advise you to stop, do not drive and if the symptoms get worse, stop the medication altogether.  Take Care! Dr. Eileen Stanford  Please call the internal medicine center clinic if you have any questions or concerns, we may be able to help and keep you from a long and expensive emergency room wait. Our clinic and after hours phone number is [phone], the best time to call is Monday through Friday 9 am to 4 pm but there is always someone available 24/7 if you have an emergency. If you need medication refills please notify your pharmacy one week in advance and they will send [place] a request.

## 2019-01-15 NOTE — Progress Notes (Signed)
   CC: Medication refill, hyperhidrosis  HPI:  Mr.Timothy [name] is a 48 y.o. very pleasant gentleman with medical history listed below presenting for evaluation of hyperhidrosis.  Please see problem based charting for further details.  Past Medical History:  Diagnosis Date  . Carpal tunnel syndrome on both sides 03/28/2013  . Chronic headache   . DVT (deep venous thrombosis) ([place])    2017-2018: 4 dvts  . GERD (gastroesophageal reflux disease)   . Helicobacter pylori gastritis 06/30/06  . 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 Pulmonary embolism ([place])    2013: 2 total  . TIA (transient ischemic attack)    Review of Systems: As per HPI  Physical Exam:  Vitals:   01/15/19 0928  BP: 114/74  Pulse: 68  Temp: 98.4 F (36.9 C)  TempSrc: Oral  SpO2: 97%  Weight: 266 lb 4.8 oz (120.8 kg)  Height: 6\' 4"  (1.93 m)   Physical Exam  Constitutional: He is well-developed, well-nourished, and in no distress.  HENT:  Head: Normocephalic and atraumatic.  Cardiovascular: Normal rate, regular rhythm and normal heart sounds.  No murmur heard. Pulmonary/Chest: Effort normal and breath sounds normal. He has no wheezes. He has no rales.  Skin:  No evidence of excessive sweating on the forehead, bilateral upper extremities or lower extremity.  Vitals reviewed.   Assessment & Plan:   See Encounters Tab for problem based charting.  Patient discussed with Dr. Lynnae January

## 2019-01-15 NOTE — Progress Notes (Signed)
Internal Medicine Clinic Attending  Case discussed with Dr. Agyei at the time of the visit.  We reviewed the resident's history and exam and pertinent patient test results.  I agree with the assessment, diagnosis, and plan of care documented in the resident's note.    

## 2019-01-15 NOTE — Assessment & Plan Note (Signed)
Hyperhidrosis: Timothy [name] states that he has been experiencing hyperhidrosis for couple years now however for the past couple weeks it has been worse.  He works in the yard a lot and usually hyperhidrosis is worse when he is out in the sun.  He has had to change his clothes several times a day.  The sweating usually starts on the forehead, arms.  He denies any systemic findings including headaches, palpitation.  He has never tried any treatment for this.  On physical exams, no evidence of sweating on the forehead, arms or armpit.  Plan: -Advised to limit the amount of time he spends outdoors -Advised to keep up with fluid intake -Trial of Aluminum Chloride Hexahydrate powder to be applied to the axilla and night before going to bed.

## 2019-01-15 NOTE — Assessment & Plan Note (Signed)
Essential tremor: [name] has a longstanding history of benign essential tremor dating back to 2017 and has been maintained on propranolol 80 mg daily.  He states that he has been off his medications recently and have noticed worsening tremors over the bilateral upper extremities.  On my assessment today, he did have intention tremors of the upper extremity during the physical exams.   Per chart review, his blood pressures have been soft with range 100s- 130s/60s- 90s.  His heart rate has ranged from 50s- 80s.  He tells me he does experience any symptoms regarding his labile blood pressures.  I did discuss with him precautions for resuming propranolol and also advised him to pay close attention if he begins to experience dizziness, lightheadedness and not to drive if this does happen.  Plan: - Refill of propranolol today at 60 mg daily

## 2019-01-16 ENCOUNTER — Other Ambulatory Visit: Payer: Self-pay | Admitting: Internal Medicine

## 2019-01-16 ENCOUNTER — Telehealth: Payer: Self-pay

## 2019-01-16 MED ORDER — DRYSOL 20 % EX SOLN: Freq: Every day | CUTANEOUS | 0 refills | Status: DC

## 2019-01-16 MED FILL — DRYSOL DAB-O-MATIC SOLUTION: 20 | 30 days supply | Qty: 35 | Fill #0

## 2019-01-16 NOTE — Telephone Encounter (Signed)
Timothy [name] with [facility] want to switch   Aluminum Chloride Hexahydrate POWD  To Drysol. Please call back.

## 2019-01-16 NOTE — Telephone Encounter (Signed)
Hi Hme,   I've made the switch. Its the same formulary.   Thanks

## 2019-01-20 ENCOUNTER — Other Ambulatory Visit: Payer: Self-pay

## 2019-01-20 ENCOUNTER — Encounter (HOSPITAL_COMMUNITY): Payer: Self-pay | Admitting: Emergency Medicine

## 2019-01-20 ENCOUNTER — Ambulatory Visit (HOSPITAL_COMMUNITY)
Admission: EM | Admit: 2019-01-20 | Discharge: 2019-01-20 | Disposition: A | Discharge status: Home / Self Care | Payer: 59 | Attending: Emergency Medicine | Admitting: Emergency Medicine

## 2019-01-20 DIAGNOSIS — K0889 Other specified disorders of teeth and supporting structures: Secondary | ICD-10-CM

## 2019-01-20 DIAGNOSIS — R200 Anesthesia of skin: Secondary | ICD-10-CM | POA: Diagnosis not present

## 2019-01-20 DIAGNOSIS — K029 Dental caries, unspecified: Secondary | ICD-10-CM

## 2019-01-20 MED ORDER — AMOXICILLIN 500 MG PO CAPS: 500.0000 mg | ORAL_CAPSULE | Freq: Three times a day (TID) | ORAL | 0 refills | Status: DC

## 2019-01-20 NOTE — ED Provider Notes (Signed)
[facility]    CSN: [id number] Arrival date & time: 01/20/19  1540     History   Chief Complaint Chief Complaint  Patient presents with  . Dental Pain  . Appointment    4:00pm    HPI Timothy [name] is a 48 y.o. male.   Patient presents with tooth ache on his right lower side x2 weeks.  He denies difficulty swallowing or breathing.  He denies fever, chills, sore throat, cough, shortness of breath.  He states he does not have a dentist.  He also notes numbness on the bottom of both of his feet just below his toes x2 months.  He denies injury or falls. He denies difficulty with ambulation. He denies weakness or pain in his LE.   The history is provided by the patient.    Past Medical History:  Diagnosis Date  . Carpal tunnel syndrome on both sides 03/28/2013  . Chronic headache   . DVT (deep venous thrombosis) ([facility])    2017-2018: 4 dvts  . GERD (gastroesophageal reflux disease)   . Helicobacter pylori gastritis 06/30/06  . 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 Pulmonary embolism ([facility])    2013: 2 total  . TIA (transient ischemic attack)     Patient Active Problem List   Diagnosis Date Noted  . Hyperhidrosis 01/15/2019  . 'light-for-dates' infant with signs of fetal malnutrition 12/06/2018  . Pulmonary embolism ([facility]) 12/06/2018  . GERD (gastroesophageal reflux disease) 12/06/2018  . Urticaria   . Angioedema 04/16/2018  . Bipolar affect, depressed ([facility]) 12/06/2015  . Severe opioid use disorder ([facility])   . Recurrent vertigo 02/12/2015  . Generalized anxiety disorder 12/31/2014  . Major depression, chronic 12/31/2014  . Hypersomnolence disorder, acute, moderate 12/31/2014  . Migraine without aura and without status migrainosus, not intractable 10/15/2014  . Benign essential tremor 10/15/2014  . Upper airway resistance syndrome 09/22/2014  . Carpal tunnel syndrome on both sides 03/28/2013  . 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 Pleuritic chest pain  11/22/2011  . Cough 11/22/2011  . Hypersomnia 11/05/2010    Past Surgical History:  Procedure Laterality Date  . KNEE SURGERY Right 10/2016       Home Medications    Prior to Admission medications   Medication Sig Start Date End Date Taking? Authorizing Provider  omeprazole (PRILOSEC) 40 MG capsule Take 1 capsule (40 mg total) by mouth 2 (two) times daily. 01/15/19  Yes Agyei, Hermina Staggersbed K, MD  propranolol ER (INDERAL LA) 60 MG 24 hr capsule Take 1 capsule (60 mg total) by mouth daily. 01/15/19 05/15/19 Yes Agyei, Hermina Staggersbed K, MD  rivaroxaban (XARELTO) 20 MG TABS tablet Take 1 tablet (20 mg total) by mouth daily with supper. 12/06/18  Yes Masoudi, Elhamalsadat, MD  sertraline (ZOLOFT) 100 MG tablet Take 1 tablet (100 mg total) by mouth daily. 12/06/18  Yes Masoudi, Elhamalsadat, MD  aluminum chloride (DRYSOL) 20 % external solution Apply topically at bedtime. 01/16/19   Yvette RackAgyei, Obed K, MD  amoxicillin (AMOXIL) 500 MG capsule Take 1 capsule (500 mg total) by mouth 3 (three) times daily. 01/20/19   Mickie Bailate, Maliik Karner H, NP  EPINEPHrine (EPIPEN 2-PAK) 0.3 mg/0.3 mL IJ SOAJ injection Inject 0.3 mLs (0.3 mg total) into the muscle once as needed for up to 1 dose (for severe allergic reaction). CAll 911 immediately if you have to use this medicine 12/03/18   Lorenso Courierhundi, Vahini, MD  famotidine (PEPCID) 40 MG tablet Take 1 tablet (40 mg total) by mouth daily. 12/25/18  Kozlow, Alvira PhilipsEric J, MD  methadone (DOLOPHINE) 10 MG/ML solution Take 45 mg by mouth daily.     [provider]    Family History Family History  Problem Relation Age of Onset  . Barrett's esophagus Mother   . Breast cancer Mother   . Irritable bowel syndrome Brother   . Allergies Brother   . Diabetes Brother   . Heart disease Father   . Heart attack Father   . Diabetes Sister   . Diabetes Paternal Grandfather   . Heart disease Paternal Grandfather   . Heart disease Paternal Grandmother   . Heart disease Maternal Grandmother   . Heart disease  Maternal Grandfather   . Colon cancer Neg Hx   . Esophageal cancer Neg Hx     Social History Social History   Tobacco Use  . Smoking status: Never Smoker  . Smokeless tobacco: Former Engineer, waterUser  Substance Use Topics  . Alcohol use: No    Alcohol/week: 0.0 standard drinks  . Drug use: Yes    Types: Other-see comments, Oxycodone, Heroin    Comment: Former Use - 12/04/15     Allergies   Ioxaglate, Ivp dye [iodinated diagnostic agents], and Shellfish allergy   Review of Systems Review of Systems  Constitutional: Negative for chills and fever.  HENT: Positive for dental problem. Negative for ear pain, sore throat and trouble swallowing.   Eyes: Negative for pain and visual disturbance.  Respiratory: Negative for cough and shortness of breath.   Cardiovascular: Negative for chest pain and palpitations.  Gastrointestinal: Negative for abdominal pain and vomiting.  Genitourinary: Negative for dysuria and hematuria.  Musculoskeletal: Negative for arthralgias, back pain and gait problem.  Skin: Negative for color change and rash.  Neurological: Positive for numbness. Negative for dizziness, seizures, syncope and weakness.  All other systems reviewed and are negative.    Physical Exam Triage Vital Signs ED Triage Vitals  Enc Vitals Group     BP      Pulse      Resp      Temp      Temp src      SpO2      Weight      Height      Head Circumference      Peak Flow      Pain Score      Pain Loc      Pain Edu?      Excl. in GC?    No data found.  Updated Vital Signs BP 133/72 (BP Location: Left Arm) Comment: large cuff  Pulse (!) 56   Temp 98.2 F (36.8 C) (Oral)   Resp 18   SpO2 98%   Visual Acuity Right Eye Distance:   Left Eye Distance:   Bilateral Distance:    Right Eye Near:   Left Eye Near:    Bilateral Near:     Physical Exam Vitals signs and nursing note reviewed.  Constitutional:      Appearance: He is well-developed.  HENT:     Head: Normocephalic  and atraumatic.     Right Ear: Tympanic membrane normal.     Left Ear: Tympanic membrane normal.     Mouth/Throat:     Mouth: Mucous membranes are moist.     Dentition: Dental caries present.     Pharynx: Oropharynx is clear. Uvula midline.      Comments: Teeth in very poor repair.  Broken teeth and dental caries throughout.   Eyes:  Conjunctiva/sclera: Conjunctivae normal.  Neck:     Musculoskeletal: Neck supple.  Cardiovascular:     Rate and Rhythm: Normal rate and regular rhythm.     Heart sounds: No murmur.  Pulmonary:     Effort: Pulmonary effort is normal. No respiratory distress.     Breath sounds: Normal breath sounds.  Abdominal:     Palpations: Abdomen is soft.     Tenderness: There is no abdominal tenderness.  Musculoskeletal:        General: No swelling or deformity.  Skin:    General: Skin is warm and dry.     Findings: No bruising or erythema.  Neurological:     General: No focal deficit present.     Mental Status: He is alert and oriented to person, place, and time.     Sensory: No sensory deficit.     Motor: No weakness.     Gait: Gait normal.     Deep Tendon Reflexes: Reflexes normal.      UC Treatments / Results  Labs (all labs ordered are listed, but only abnormal results are displayed) Labs Reviewed - No data to display  EKG   Radiology No results found.  Procedures Procedures (including critical care time)  Medications Ordered in UC Medications - No data to display  Initial Impression / Assessment and Plan / UC Course  I have reviewed the triage vital signs and the nursing notes.  Pertinent labs & imaging results that were available during my care of the patient were reviewed by me and considered in my medical decision making (see chart for details).   Tooth ache, dental caries.  Numbness in bilateral feet, subjective.  Treating with amoxicillin.  Dental resource guide provided and patient instructed to call a dentist to schedule the  soonest available appointment.  Instructed patient to follow-up with his PCP to discuss the ongoing numbness in his feet.  Instructed patient to return here or go to the ED if he has acute pain or weakness in his LE.     Final Clinical Impressions(s) / UC Diagnoses   Final diagnoses:  Toothache  Dental caries  Numbness of foot     Discharge Instructions     Take the antibiotic amoxicillin as prescribed for your dental pain.  Make an appointment with a dentist as soon as possible.  A dental resource guide is attached.    Follow-up with your primary care provider to discuss your ongoing numbness in your feet.        ED Prescriptions    Medication Sig Dispense Auth. Provider   amoxicillin (AMOXIL) 500 MG capsule Take 1 capsule (500 mg total) by mouth 3 (three) times daily. 21 capsule Sharion Balloon, NP     Controlled Substance Prescriptions [place] Controlled Substance Registry consulted? Yes, I have consulted the [place] Controlled Substances Registry for this patient, and feel the risk/benefit ratio today is favorable for proceeding with this prescription for a controlled substance.   Sharion Balloon, NP 01/20/19 [phone]

## 2019-01-20 NOTE — ED Triage Notes (Addendum)
Dental pain on lower right.  Noticed this episode of pain 2 weeks ago  Patient is also concerned for numbness on bottom of both feet, this has been noticeable over the past 2 months

## 2019-01-20 NOTE — Discharge Instructions (Addendum)
Take the antibiotic amoxicillin as prescribed for your dental pain.  Make an appointment with a dentist as soon as possible.  A dental resource guide is attached.    Follow-up with your primary care provider to discuss your ongoing numbness in your feet.

## 2019-01-21 MED FILL — AMOXICILLIN 500 MG CAPSULE: 500 | 7 days supply | Qty: 21 | Fill #0

## 2019-01-22 ENCOUNTER — Ambulatory Visit (INDEPENDENT_AMBULATORY_CARE_PROVIDER_SITE_OTHER): Payer: 59 | Admitting: Allergy and Immunology

## 2019-01-22 ENCOUNTER — Encounter: Payer: Self-pay | Admitting: Allergy and Immunology

## 2019-01-22 ENCOUNTER — Other Ambulatory Visit: Payer: Self-pay

## 2019-01-22 VITALS — BP 140/76 | HR 66 | Resp 18

## 2019-01-22 DIAGNOSIS — T783XXD Angioneurotic edema, subsequent encounter: Secondary | ICD-10-CM | POA: Diagnosis not present

## 2019-01-22 DIAGNOSIS — T782XXD Anaphylactic shock, unspecified, subsequent encounter: Secondary | ICD-10-CM

## 2019-01-22 MED ORDER — CETIRIZINE HCL 10 MG PO TABS: 10.0000 mg | ORAL_TABLET | Freq: Every day | ORAL | 5 refills | Status: DC

## 2019-01-22 NOTE — Patient Instructions (Addendum)
  1.  Use a combination of the following every day:   A.  Cetirizine 10 mg -1 tablet 1 time per day  B.  Famotidine 40 mg -1 tablet 1 time per day  2.  If needed: Epi-Pen, Benadryl, MD/ER evaluation for allergic reaction  3.  Blood - C4, tryptase, alpha gal panel, TSH, T4, TP, fire ant panel, hymenoptera panel, area 2 aeroallergen panel  4.  Return to clinic in January 2021 or earlier if problem  5. Obtain fall flu vaccine (and COVID vaccine)

## 2019-01-22 NOTE — Progress Notes (Signed)
[place]   Follow-up Note  Referring Provider: No ref. provider found Primary Provider: Kirt BoysAlexander, Andrew, MD Date of Office Visit: 01/22/2019  Subjective:   Timothy ChimesJason L [name] (DOB: [birth year]) is a 48 y.o. male who [facility] in re-evaluation of the following:  HPI: Barbara CowerJason returns to this clinic in evaluation of his recurrent allergic reactions.  His last visit to this clinic was 25 December 2018.  While consistently using a combination of an H1 and H2 receptor blocker has not had any allergic reactions.  We did ask him to obtain some blood tests earlier this year but unfortunately we cannot find the results of those blood tests and he is not sure if he had unsuccessful phlebotomy attempt when he did go to the phlebotomy center.  Allergies as of 01/22/2019      Reactions   Ioxaglate Shortness Of Breath, Other (See Comments)   dizziness   Ivp Dye [iodinated Diagnostic Agents] Shortness Of Breath, Other (See Comments)   dizziness   Shellfish Allergy       Medication List    Drysol 20 % external solution Generic drug: aluminum chloride Apply topically at bedtime.   EPINEPHrine 0.3 mg/0.3 mL Soaj injection Commonly known as: EpiPen 2-Pak Inject 0.3 mLs (0.3 mg total) into the muscle once as needed for up to 1 dose (for severe allergic reaction). CAll 911 immediately if you have to use this medicine   famotidine 40 MG tablet Commonly known as: PEPCID Take 1 tablet (40 mg total) by mouth daily.   methadone 10 MG/ML solution Commonly known as: DOLOPHINE Take 45 mg by mouth daily.   omeprazole 40 MG capsule Commonly known as: PRILOSEC Take 1 capsule (40 mg total) by mouth 2 (two) times daily.   propranolol ER 60 MG 24 hr capsule Commonly known as: Inderal LA Take 1 capsule (60 mg total) by mouth daily.   rivaroxaban 20 MG Tabs tablet Commonly known as: XARELTO Take 1 tablet (20 mg  total) by mouth daily with supper.   sertraline 100 MG tablet Commonly known as: ZOLOFT Take 1 tablet (100 mg total) by mouth daily.       Past Medical History:  Diagnosis Date  . Carpal tunnel syndrome on both sides 03/28/2013  . Chronic headache   . DVT (deep venous thrombosis) ([facility])    2017-2018: 4 dvts  . GERD (gastroesophageal reflux disease)   . Helicobacter pylori gastritis 06/30/06  . 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 Pulmonary embolism ([facility])    2013: 2 total  . TIA (transient ischemic attack)     Past Surgical History:  Procedure Laterality Date  . KNEE SURGERY Right 10/2016    Review of systems negative except as noted in HPI / PMHx or noted below:  Review of Systems  Constitutional: Negative.   HENT: Negative.   Eyes: Negative.   Respiratory: Negative.   Cardiovascular: Negative.   Gastrointestinal: Negative.   Genitourinary: Negative.   Musculoskeletal: Negative.   Skin: Negative.   Neurological: Negative.   Endo/Heme/Allergies: Negative.   Psychiatric/Behavioral: Negative.      Objective:   Vitals:   01/22/19 1510  BP: 140/76  Pulse: 66  Resp: 18  SpO2: 99%          Physical Exam-deferred  Diagnostics:    Assessment and Plan:   1. Anaphylaxis, subsequent encounter   2. Angioedema, subsequent encounter  1.  Use a combination of the following every day:   A.  Cetirizine 10 mg -1 tablet 1 time per day  B.  Famotidine 40 mg -1 tablet 1 time per day  2.  If needed: Epi-Pen, Benadryl, MD/ER evaluation for allergic reaction  3.  Blood - C4, tryptase, alpha gal panel, TSH, T4, TP, fire ant panel, hymenoptera panel, area 2 aeroallergen panel  4.  Return to clinic in January 2021 or earlier if problem  5. Obtain fall flu vaccine (and COVID vaccine)  Ashaun will continue on an H1 and H2 receptor blocker on a consistent basis.  We will have him undergo blood test looking for the etiologic agent responsible for his  immunological hyperactivity.  Because these reactions appear to occur around the time that he weed eats we will check an area two aero allergen profile among other tests.  I will see him back in this clinic in January 2021 or earlier if there is a problem.  Allena Katz, MD Allergy / Immunology [place]

## 2019-01-23 ENCOUNTER — Encounter: Payer: Self-pay | Admitting: Allergy and Immunology

## 2019-01-29 LAB — ALLERGENS W/TOTAL IGE AREA 2
Alternaria Alternata IgE: <0.1 kU/L
Aspergillus Fumigatus IgE: <0.1 kU/L
Bermuda Grass IgE: <0.1 kU/L
Cat Dander IgE: 0.16 kU/L — AB
Cedar, Mountain IgE: <0.1 kU/L
Cladosporium Herbarum IgE: <0.1 kU/L
Cockroach, German IgE: <0.1 kU/L
Common Silver Birch IgE: <0.1 kU/L
Cottonwood IgE: <0.1 kU/L
D Farinae IgE: <0.1 kU/L
D Pteronyssinus IgE: <0.1 kU/L
Dog Dander IgE: 0.15 kU/L — AB
Elm, American IgE: <0.1 kU/L
IgE (Immunoglobulin E), Serum: 75 IU/mL (ref 6–495)
Johnson Grass IgE: <0.1 kU/L
Maple/Box Elder IgE: <0.1 kU/L
Mouse Urine IgE: <0.1 kU/L
Oak, White IgE: <0.1 kU/L
Pecan, Hickory IgE: <0.1 kU/L
Penicillium Chrysogen IgE: <0.1 kU/L
Pigweed, Rough IgE: <0.1 kU/L
Ragweed, Short IgE: <0.1 kU/L
Sheep Sorrel IgE Qn: <0.1 kU/L
Timothy Grass IgE: <0.1 kU/L
White Mulberry IgE: <0.1 kU/L

## 2019-01-29 LAB — ALLERGEN HYMENOPTERA PANEL
Bumblebee: <0.1 kU/L
Honeybee IgE: 0.32 kU/L — AB
Hornet, White Face, IgE: 0.35 kU/L — AB
Hornet, Yellow, IgE: <0.1 kU/L
Paper Wasp IgE: 1.77 kU/L — AB
Yellow Jacket, IgE: 3.63 kU/L — AB

## 2019-01-29 LAB — ALPHA-GAL PANEL
Alpha Gal IgE*: 13.6 kU/L — ABNORMAL HIGH (ref <0.10)
Beef (Bos spp) IgE: 3.3 kU/L — ABNORMAL HIGH (ref <0.35)
Class Interpretation: 1
Class Interpretation: 2
Class Interpretation: 2
Lamb/Mutton (Ovis spp) IgE: 0.38 kU/L — ABNORMAL HIGH (ref <0.35)
Pork (Sus spp) IgE: 1.54 kU/L — ABNORMAL HIGH (ref <0.35)

## 2019-01-29 LAB — THYROID PEROXIDASE ANTIBODY: Thyroperoxidase Ab SerPl-aCnc: 14 IU/mL (ref 0–34)

## 2019-01-29 LAB — TRYPTASE: Tryptase: 5.5 ug/L (ref 2.2–13.2)

## 2019-01-29 LAB — ALLERGEN FIRE ANT: I070-IgE Fire Ant (Invicta): 10.2 kU/L — AB

## 2019-01-29 LAB — TSH+FREE T4
Free T4: 0.98 ng/dL (ref 0.82–1.77)
TSH: 1.39 u[IU]/mL (ref 0.450–4.500)

## 2019-01-29 LAB — C4 COMPLEMENT: Complement C4, Serum: 20 mg/dL (ref 14–44)

## 2019-01-31 ENCOUNTER — Encounter: Payer: Self-pay | Admitting: *Deleted

## 2019-02-04 MED FILL — SERTRALINE HCL 100 MG TAB: 100 | 30 days supply | Qty: 30 | Fill #1

## 2019-02-12 ENCOUNTER — Encounter (HOSPITAL_COMMUNITY): Payer: Self-pay | Admitting: Emergency Medicine

## 2019-02-12 ENCOUNTER — Other Ambulatory Visit: Payer: Self-pay

## 2019-02-12 ENCOUNTER — Ambulatory Visit (HOSPITAL_COMMUNITY)
Admission: EM | Admit: 2019-02-12 | Discharge: 2019-02-12 | Disposition: A | Discharge status: Home / Self Care | Payer: 59 | Attending: Emergency Medicine | Admitting: Emergency Medicine

## 2019-02-12 DIAGNOSIS — K0889 Other specified disorders of teeth and supporting structures: Secondary | ICD-10-CM

## 2019-02-12 MED ORDER — CLINDAMYCIN HCL 300 MG PO CAPS: 300.0000 mg | ORAL_CAPSULE | Freq: Three times a day (TID) | ORAL | 0 refills | Status: DC

## 2019-02-12 MED FILL — CLINDAMYCIN HCL 300 MG CAPS: 300 | 7 days supply | Qty: 21 | Fill #0

## 2019-02-12 NOTE — ED Provider Notes (Signed)
[facility]    CSN: [id number] Arrival date & time: 02/12/19  1013      History   Chief Complaint Chief Complaint  Patient presents with  . Dental Pain    HPI Timothy [name] is a 48 y.o. male.   Patient presents with right upper tooth pain and gum redness x 1 day.  He was seen here for dental pain on 01/20/2019 and treated with amoxicillin.  He states his symptoms improved and he went to the dentist and had the problematic teeth pulled.  He denies fever, chills, difficulty swallowing, or other symptoms.  Past medical history is significant for severe opioid use disorder; Patient is currently on methadone per medical record.  The history is provided by the patient.    Past Medical History:  Diagnosis Date  . Carpal tunnel syndrome on both sides 03/28/2013  . Chronic headache   . DVT (deep venous thrombosis) (HCC)    2017-2018: 4 dvts  . GERD (gastroesophageal reflux disease)   . Helicobacter pylori gastritis 06/30/06  . 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 Pulmonary embolism (HCC)    2013: 2 total  . TIA (transient ischemic attack)     Patient Active Problem List   Diagnosis Date Noted  . Hyperhidrosis 01/15/2019  . 'light-for-dates' infant with signs of fetal malnutrition 12/06/2018  . Pulmonary embolism (HCC) 12/06/2018  . GERD (gastroesophageal reflux disease) 12/06/2018  . Urticaria   . Angioedema 04/16/2018  . Bipolar affect, depressed (HCC) 12/06/2015  . Severe opioid use disorder (HCC)   . Recurrent vertigo 02/12/2015  . Generalized anxiety disorder 12/31/2014  . Major depression, chronic 12/31/2014  . Hypersomnolence disorder, acute, moderate 12/31/2014  . Migraine without aura and without status migrainosus, not intractable 10/15/2014  . Benign essential tremor 10/15/2014  . Upper airway resistance syndrome 09/22/2014  . Carpal tunnel syndrome on both sides 03/28/2013  . 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 Pleuritic chest pain  11/22/2011  . Cough 11/22/2011  . Hypersomnia 11/05/2010    Past Surgical History:  Procedure Laterality Date  . KNEE SURGERY Right 10/2016       Home Medications    Prior to Admission medications   Medication Sig Start Date End Date Taking? Authorizing Provider  aluminum chloride (DRYSOL) 20 % external solution Apply topically at bedtime. 01/16/19   Yvette RackAgyei, Obed K, MD  cetirizine (ZYRTEC) 10 MG tablet Take 1 tablet (10 mg total) by mouth daily. 01/22/19   Kozlow, Alvira PhilipsEric J, MD  clindamycin (CLEOCIN) 300 MG capsule Take 1 capsule (300 mg total) by mouth 3 (three) times daily. 02/12/19   Mickie Bailate, Cyrus Ramsburg H, NP  EPINEPHrine (EPIPEN 2-PAK) 0.3 mg/0.3 mL IJ SOAJ injection Inject 0.3 mLs (0.3 mg total) into the muscle once as needed for up to 1 dose (for severe allergic reaction). CAll 911 immediately if you have to use this medicine 12/03/18   Lorenso Courierhundi, Vahini, MD  famotidine (PEPCID) 40 MG tablet Take 1 tablet (40 mg total) by mouth daily. 12/25/18   Kozlow, Alvira PhilipsEric J, MD  methadone (DOLOPHINE) 10 MG/ML solution Take 45 mg by mouth daily.     [provider]  omeprazole (PRILOSEC) 40 MG capsule Take 1 capsule (40 mg total) by mouth 2 (two) times daily. 01/15/19   Yvette RackAgyei, Obed K, MD  propranolol ER (INDERAL LA) 60 MG 24 hr capsule Take 1 capsule (60 mg total) by mouth daily. 01/15/19 05/15/19  Yvette RackAgyei, Obed K, MD  rivaroxaban (XARELTO) 20 MG TABS tablet Take  1 tablet (20 mg total) by mouth daily with supper. 12/06/18   Masoudi, Shawna Orleans, MD  sertraline (ZOLOFT) 100 MG tablet Take 1 tablet (100 mg total) by mouth daily. 12/06/18   MasoudiShawna Orleans, MD    Family History Family History  Problem Relation Age of Onset  . Barrett's esophagus Mother   . Breast cancer Mother   . Irritable bowel syndrome Brother   . Allergies Brother   . Diabetes Brother   . Heart disease Father   . Heart attack Father   . Diabetes Sister   . Diabetes Paternal Grandfather   . Heart disease Paternal Grandfather   .  Heart disease Paternal Grandmother   . Heart disease Maternal Grandmother   . Heart disease Maternal Grandfather   . Colon cancer Neg Hx   . Esophageal cancer Neg Hx     Social History Social History   Tobacco Use  . Smoking status: Never Smoker  . Smokeless tobacco: Former Engineer, water Use Topics  . Alcohol use: No    Alcohol/week: 0.0 standard drinks  . Drug use: Yes    Types: Other-see comments, Oxycodone, Heroin    Comment: Former Use - 12/04/15     Allergies   Ioxaglate, Ivp dye [iodinated diagnostic agents], and Shellfish allergy   Review of Systems Review of Systems  Constitutional: Negative for chills and fever.  HENT: Positive for dental problem. Negative for ear pain, sore throat and trouble swallowing.   Eyes: Negative for pain and visual disturbance.  Respiratory: Negative for cough and shortness of breath.   Cardiovascular: Negative for chest pain and palpitations.  Gastrointestinal: Negative for abdominal pain and vomiting.  Genitourinary: Negative for dysuria and hematuria.  Musculoskeletal: Negative for arthralgias and back pain.  Skin: Negative for color change and rash.  Neurological: Negative for seizures and syncope.  All other systems reviewed and are negative.    Physical Exam Triage Vital Signs ED Triage Vitals  Enc Vitals Group     BP      Pulse      Resp      Temp      Temp src      SpO2      Weight      Height      Head Circumference      Peak Flow      Pain Score      Pain Loc      Pain Edu?      Excl. in GC?    No data found.  Updated Vital Signs BP (!) 146/84 (BP Location: Left Arm)   Pulse 75   Temp 98.5 F (36.9 C) (Oral)   Resp 18   SpO2 96%   Visual Acuity Right Eye Distance:   Left Eye Distance:   Bilateral Distance:    Right Eye Near:   Left Eye Near:    Bilateral Near:     Physical Exam Vitals signs and nursing note reviewed.  Constitutional:      Appearance: He is well-developed.  HENT:      Head: Normocephalic and atraumatic.     Mouth/Throat:     Mouth: Mucous membranes are moist.     Dentition: Dental tenderness, gingival swelling and dental caries present.     Pharynx: Oropharynx is clear. Uvula midline.      Comments: Gingival redness and mild edema.     Poor dentition throughout.    Eyes:     Conjunctiva/sclera: Conjunctivae normal.  Neck:     Musculoskeletal: Neck supple.  Cardiovascular:     Rate and Rhythm: Normal rate and regular rhythm.     Heart sounds: No murmur.  Pulmonary:     Effort: Pulmonary effort is normal. No respiratory distress.     Breath sounds: Normal breath sounds.  Abdominal:     Palpations: Abdomen is soft.     Tenderness: There is no abdominal tenderness.  Skin:    General: Skin is warm and dry.  Neurological:     Mental Status: He is alert.      UC Treatments / Results  Labs (all labs ordered are listed, but only abnormal results are displayed) Labs Reviewed - No data to display  EKG   Radiology No results found.  Procedures Procedures (including critical care time)  Medications Ordered in UC Medications - No data to display  Initial Impression / Assessment and Plan / UC Course  I have reviewed the triage vital signs and the nursing notes.  Pertinent labs & imaging results that were available during my care of the patient were reviewed by me and considered in my medical decision making (see chart for details).   Toothache.  Treating with clindamycin.  Instructed patient to follow up with his dentist for soonest available appointment; dental resource guide provided.   Instructed patient to return here or go to the emergency department if he develops increased pain, swelling, fever, chills, or other concerning symptoms.  Patient agrees with plan of care.     Final Clinical Impressions(s) / UC Diagnoses   Final diagnoses:  Toothache     Discharge Instructions     Take the antibiotic as prescribed.    A dental  resource guide is attached.  Please call to make an appointment with a dentist as soon as possible.    Return here or go to the emergency department if you develop increased pain, swelling, fever, chills, or other concerning symptoms.        ED Prescriptions    Medication Sig Dispense Auth. Provider   clindamycin (CLEOCIN) 300 MG capsule Take 1 capsule (300 mg total) by mouth 3 (three) times daily. 21 capsule Sharion Balloon, NP     Controlled Substance Prescriptions [place] Controlled Substance Registry consulted? Not Applicable   Sharion Balloon, NP 02/12/19 1137

## 2019-02-12 NOTE — ED Triage Notes (Signed)
Pt sts right upper dental pain with swelling

## 2019-02-12 NOTE — Discharge Instructions (Addendum)
Take the antibiotic as prescribed.    A dental resource guide is attached.  Please call to make an appointment with a dentist as soon as possible.    Return here or go to the emergency department if you develop increased pain, swelling, fever, chills, or other concerning symptoms.     

## 2019-03-07 ENCOUNTER — Other Ambulatory Visit: Payer: Self-pay | Admitting: *Deleted

## 2019-03-07 MED FILL — EPINEPHRINE 0.3 MG AUTO-INJ: 0.3 | 30 days supply | Qty: 2 | Fill #0

## 2019-03-12 NOTE — Telephone Encounter (Signed)
Called patient's cell phone and was not able to reach the patient. and left a message inquiring about Epi Pen refill. It appears he was prescribed one 3 months ago by Dr. Maricela Bo. I would like to get more information about it before approving the refill. I left the clinic's number [phone]) in his voice mail.  Earlene Plater, MD Internal Medicine, PGY1 Pager: [phone]  03/12/2019,1:43 PM

## 2019-03-20 MED ORDER — EPINEPHRINE 0.3 MG/0.3ML IJ SOAJ: 0.3000 mg | Freq: Once | INTRAMUSCULAR | 0 refills | Status: DC | PRN

## 2019-04-01 ENCOUNTER — Other Ambulatory Visit: Payer: Self-pay | Admitting: Allergy and Immunology

## 2019-04-01 MED ORDER — EPINEPHRINE 0.3 MG/0.3ML IJ SOAJ: 0.3000 mg | Freq: Once | INTRAMUSCULAR | 0 refills | Status: DC | PRN

## 2019-04-01 NOTE — Telephone Encounter (Signed)
Patient called stating that he had to use 2 Epi Pens to stop a reaction and needs more refills. Patient states that he no longer has insurance and does not know how to go about getting another Epi Pen without paying a high cost.  Patient would like to know if there is a way to get a discount on Epi Pens without insurance.  Uses [name].  Please advise.

## 2019-04-01 NOTE — Telephone Encounter (Signed)
Rx for Epi-pen printed for patient to collect to search to see which pharmacy has it the cheapest. Patient will pick up Rx when he can.

## 2019-09-24 IMAGING — DX DG CHEST 1V PORT
1 series · 2 of 2 positions shown · non-contrast
Comparison: Portable chest x-ray of earlier today

CLINICAL DATA: Assess endotracheal tube position

EXAM:
PORTABLE CHEST 1 VIEW

[Series 1: chest · 0.14mm/px · 2 of 2 slices shown]
[im 1/2]
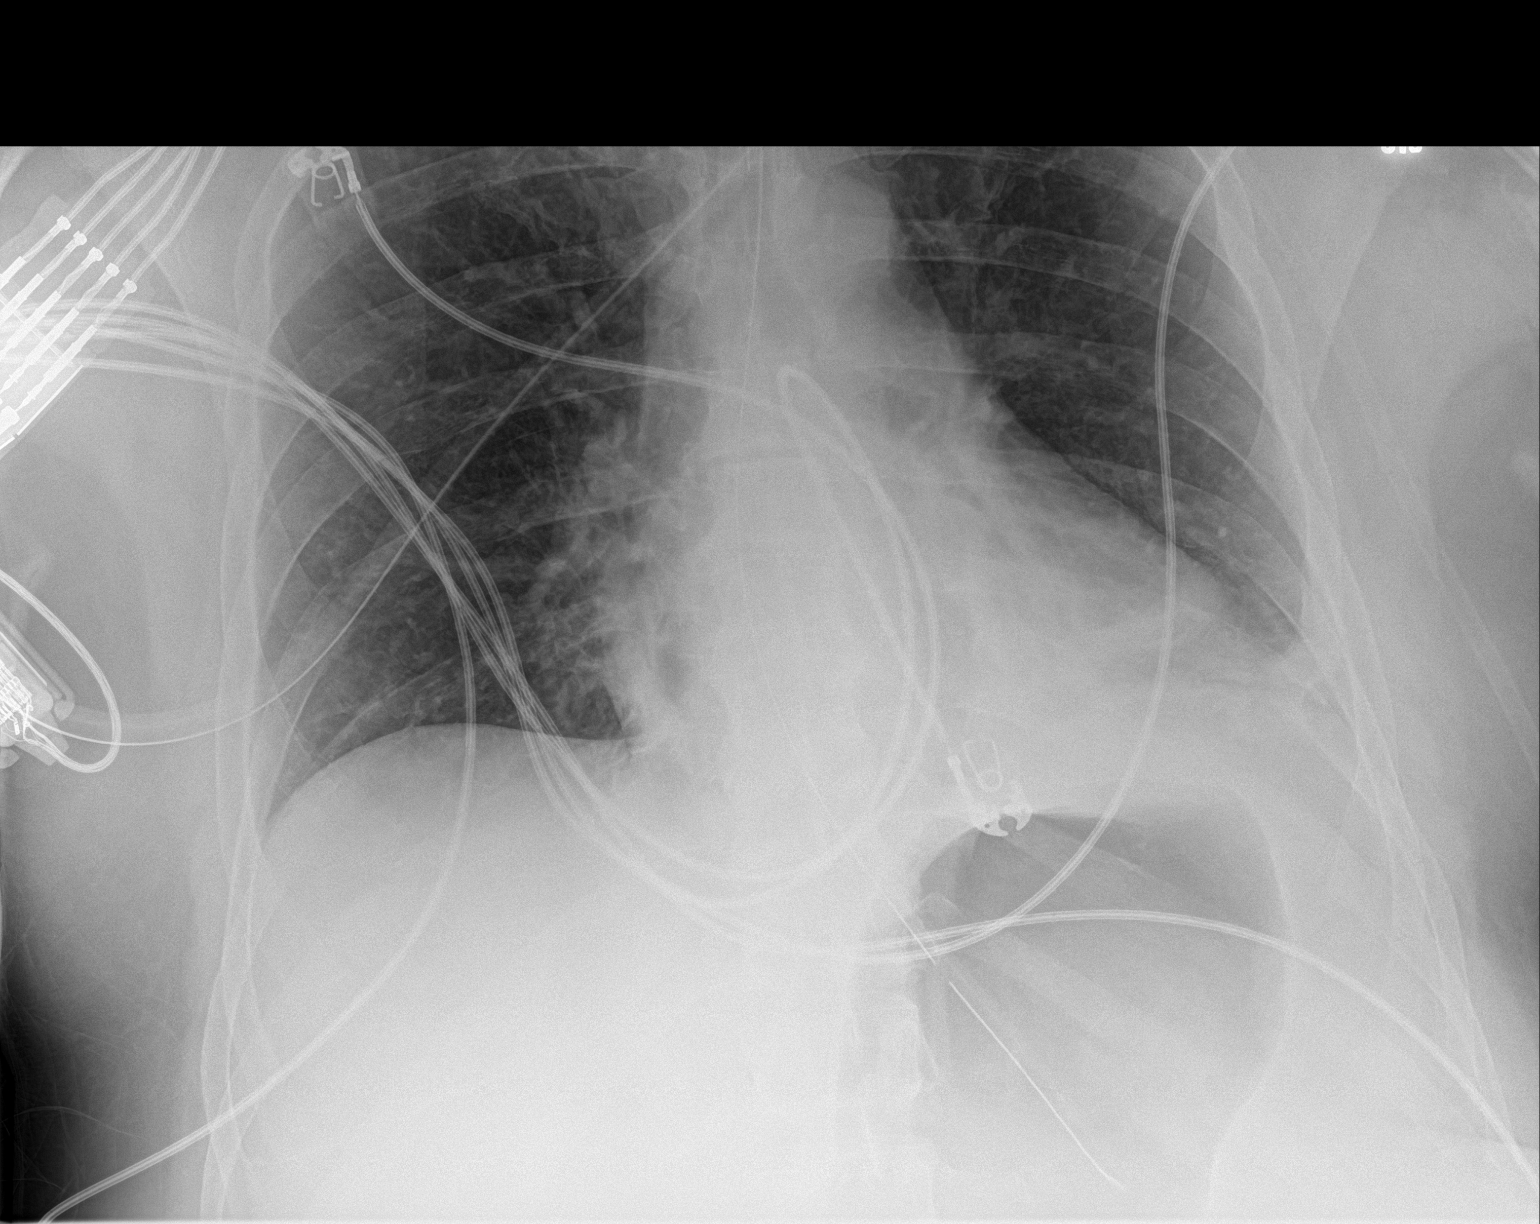
[im 2/2]
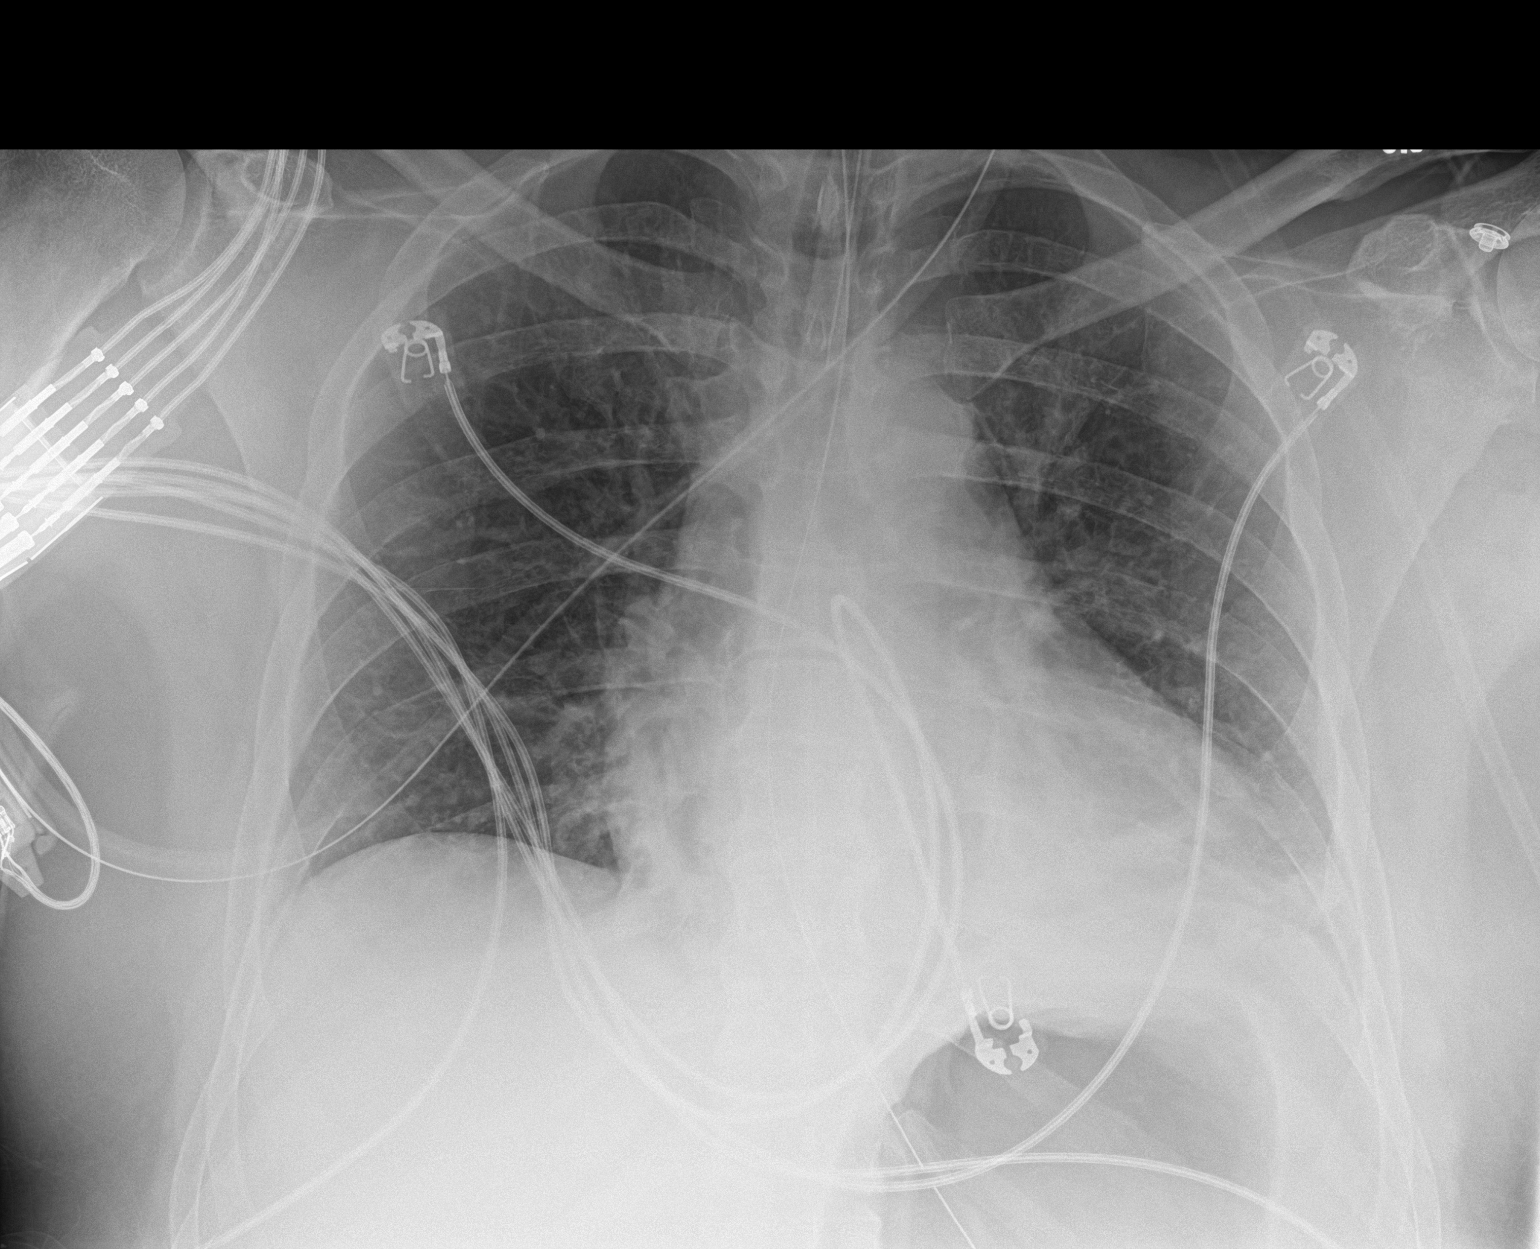

[2 of 2 positions shown; findings below may reference images not displayed]

FINDINGS: The endotracheal tube tip projects approximately 4.0 cm above the
carina. The esophagogastric tube tip in proximal port project below
the left hemidiaphragm. There is persistent patchy density at the
left lung base. The right lung is grossly clear. The heart is
top-normal in size. The pulmonary vascularity is normal.
IMPRESSION: The endotracheal tube tip projects 4 cm above the carina.

## 2019-10-14 ENCOUNTER — Encounter: Payer: Self-pay | Admitting: *Deleted

## 2020-02-11 ENCOUNTER — Encounter: Payer: Self-pay | Admitting: Student

## 2020-03-08 ENCOUNTER — Other Ambulatory Visit: Payer: Self-pay

## 2020-03-08 ENCOUNTER — Encounter (HOSPITAL_COMMUNITY): Payer: Self-pay | Admitting: Emergency Medicine

## 2020-03-08 ENCOUNTER — Ambulatory Visit (HOSPITAL_COMMUNITY)
Admission: EM | Admit: 2020-03-08 | Discharge: 2020-03-08 | Disposition: A | Discharge status: Home / Self Care | Payer: Self-pay | Attending: Internal Medicine | Admitting: Internal Medicine

## 2020-03-08 DIAGNOSIS — K047 Periapical abscess without sinus: Secondary | ICD-10-CM

## 2020-03-08 MED ORDER — HYDROCODONE-ACETAMINOPHEN 5-325 MG PO TABS: 1.0000 | ORAL_TABLET | Freq: Four times a day (QID) | ORAL | 0 refills | Status: DC | PRN

## 2020-03-08 MED ORDER — AMOXICILLIN-POT CLAVULANATE 875-125 MG PO TABS: 1.0000 | ORAL_TABLET | Freq: Two times a day (BID) | ORAL | 0 refills | Status: DC

## 2020-03-08 NOTE — ED Triage Notes (Signed)
Pt presents with dental abscess xs 3 days. States painful to eat or drink.

## 2020-03-08 NOTE — Discharge Instructions (Addendum)
Please take medications as directed Complete antibiotic course Follow-up with the dentist for evaluation Return to urgent care if your symptoms worsen

## 2020-03-09 NOTE — ED Provider Notes (Signed)
[facility]    CSN: [id number] Arrival date & time: 03/08/20  1009      History   Chief Complaint Chief Complaint  Patient presents with  . Dental Pain    HPI Timothy [name] is a 49 y.o. male comes to the urgent care with complaint of dental pain of 3 days duration.  Patient has poor dental hygiene.  His symptoms started 3 days ago and has been persistent since then.  Is associated with jaw swelling on the left side.  Patient's oral intake is decreased.  No fever or chills.  Pain is constant, throbbing, aggravated by chewing, not relieved by over-the-counter pain medications and associated with left jaw swelling and jaw pain and opening his mouth. HPI  Past Medical History:  Diagnosis Date  . Carpal tunnel syndrome on both sides 03/28/2013  . Chronic headache   . DVT (deep venous thrombosis) (HCC)    2017-2018: 4 dvts  . GERD (gastroesophageal reflux disease)   . Helicobacter pylori gastritis 06/30/06  . 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 Pulmonary embolism (HCC)    2013: 2 total  . TIA (transient ischemic attack)     Patient Active Problem List   Diagnosis Date Noted  . Hyperhidrosis 01/15/2019  . 'light-for-dates' infant with signs of fetal malnutrition 12/06/2018  . Pulmonary embolism (HCC) 12/06/2018  . GERD (gastroesophageal reflux disease) 12/06/2018  . Urticaria   . Angioedema 04/16/2018  . Bipolar affect, depressed (HCC) 12/06/2015  . Severe opioid use disorder (HCC)   . Recurrent vertigo 02/12/2015  . Generalized anxiety disorder 12/31/2014  . Major depression, chronic 12/31/2014  . Hypersomnolence disorder, acute, moderate 12/31/2014  . Migraine without aura and without status migrainosus, not intractable 10/15/2014  . Benign essential tremor 10/15/2014  . Upper airway resistance syndrome 09/22/2014  . Carpal tunnel syndrome on both sides 03/28/2013  . 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 Pleuritic chest pain 11/22/2011  . Cough  11/22/2011  . Hypersomnia 11/05/2010    Past Surgical History:  Procedure Laterality Date  . KNEE SURGERY Right 10/2016       Home Medications    Prior to Admission medications   Medication Sig Start Date End Date Taking? Authorizing Provider  aluminum chloride (DRYSOL) 20 % external solution Apply topically at bedtime. 01/16/19   Yvette Rack, MD  amoxicillin-clavulanate (AUGMENTIN) 875-125 MG tablet Take 1 tablet by mouth every 12 (twelve) hours. 03/08/20   Brentley Horrell, Britta Mccreedy, MD  cetirizine (ZYRTEC) 10 MG tablet Take 1 tablet (10 mg total) by mouth daily. 01/22/19   Kozlow, Alvira Philips, MD  EPINEPHrine (EPIPEN 2-PAK) 0.3 mg/0.3 mL IJ SOAJ injection Inject 0.3 mLs (0.3 mg total) into the muscle once as needed for up to 1 dose (for severe allergic reaction). CAll 911 immediately if you have to use this medicine 04/01/19   Kozlow, Alvira Philips, MD  famotidine (PEPCID) 40 MG tablet Take 1 tablet (40 mg total) by mouth daily. 12/25/18   Kozlow, Alvira Philips, MD  HYDROcodone-acetaminophen (NORCO/VICODIN) 5-325 MG tablet Take 1 tablet by mouth every 6 (six) hours as needed for moderate pain. 03/08/20   Merrilee Jansky, MD  methadone (DOLOPHINE) 10 MG/ML solution Take 45 mg by mouth daily.     [provider]  omeprazole (PRILOSEC) 40 MG capsule Take 1 capsule (40 mg total) by mouth 2 (two) times daily. 01/15/19   Yvette Rack, MD  propranolol ER (INDERAL LA) 60 MG 24 hr capsule Take 1 capsule (  60 mg total) by mouth daily. 01/15/19 05/15/19  Yvette Rack, MD  rivaroxaban (XARELTO) 20 MG TABS tablet Take 1 tablet (20 mg total) by mouth daily with supper. 12/06/18   Masoudi, Shawna Orleans, MD  sertraline (ZOLOFT) 100 MG tablet Take 1 tablet (100 mg total) by mouth daily. 12/06/18 03/08/20  MasoudiShawna Orleans, MD    Family History Family History  Problem Relation Age of Onset  . Barrett's esophagus Mother   . Breast cancer Mother   . Irritable bowel syndrome Brother   . Allergies Brother   . Diabetes  Brother   . Heart disease Father   . Heart attack Father   . Diabetes Sister   . Diabetes Paternal Grandfather   . Heart disease Paternal Grandfather   . Heart disease Paternal Grandmother   . Heart disease Maternal Grandmother   . Heart disease Maternal Grandfather   . Colon cancer Neg Hx   . Esophageal cancer Neg Hx     Social History Social History   Tobacco Use  . Smoking status: Never Smoker  . Smokeless tobacco: Former Clinical biochemist  . Vaping Use: Never used  Substance Use Topics  . Alcohol use: No    Alcohol/week: 0.0 standard drinks  . Drug use: Yes    Types: Other-see comments, Oxycodone, Heroin    Comment: Former Use - 12/04/15     Allergies   Ioxaglate, Ivp dye [iodinated diagnostic agents], and Shellfish allergy   Review of Systems Review of Systems  HENT: Positive for facial swelling. Negative for ear discharge, ear pain, sore throat and tinnitus.   Respiratory: Negative.   Cardiovascular: Negative.   Gastrointestinal: Negative.      Physical Exam Triage Vital Signs ED Triage Vitals  Enc Vitals Group     BP 03/08/20 1047 137/61     Pulse Rate 03/08/20 1047 64     Resp 03/08/20 1047 17     Temp 03/08/20 1047 98 F (36.7 C)     Temp Source 03/08/20 1047 Oral     SpO2 03/08/20 1047 97 %     Weight --      Height --      Head Circumference --      Peak Flow --      Pain Score 03/08/20 1046 8     Pain Loc --      Pain Edu? --      Excl. in GC? --    No data found.  Updated Vital Signs BP 137/61 (BP Location: Right Arm)   Pulse 64   Temp 98 F (36.7 C) (Oral)   Resp 17   SpO2 97%   Visual Acuity Right Eye Distance:   Left Eye Distance:   Bilateral Distance:    Right Eye Near:   Left Eye Near:    Bilateral Near:     Physical Exam Vitals and nursing note reviewed.  Constitutional:      General: He is not in acute distress.    Appearance: He is not ill-appearing.  HENT:     Right Ear: Tympanic membrane normal.     Left  Ear: Tympanic membrane normal.     Nose: Nose normal.     Mouth/Throat:     Mouth: Mucous membranes are moist.     Pharynx: No posterior oropharyngeal erythema.     Comments: Poor dental hygiene.  Swelling of the left jaw area.  Gum swelling in the left lower premolar. Cardiovascular:  Rate and Rhythm: Normal rate and regular rhythm.  Neurological:     Mental Status: He is alert.      UC Treatments / Results  Labs (all labs ordered are listed, but only abnormal results are displayed) Labs Reviewed - No data to display  EKG   Radiology No results found.  Procedures Procedures (including critical care time)  Medications Ordered in UC Medications - No data to display  Initial Impression / Assessment and Plan / UC Course  I have reviewed the triage vital signs and the nursing notes.  Pertinent labs & imaging results that were available during my care of the patient were reviewed by me and considered in my medical decision making (see chart for details).    1.  Dental abscess: Augmentin 875-125 mg twice daily for 7 days Hydrocodone-acetaminophen as needed for pain Patient is advised to see a dentist for some dental extraction Return precautions given. Final Clinical Impressions(s) / UC Diagnoses   Final diagnoses:  Dental abscess     Discharge Instructions     Please take medications as directed Complete antibiotic course Follow-up with the dentist for evaluation Return to urgent care if your symptoms worsen   ED Prescriptions    Medication Sig Dispense Auth. Provider   amoxicillin-clavulanate (AUGMENTIN) 875-125 MG tablet Take 1 tablet by mouth every 12 (twelve) hours. 14 tablet Shamela Haydon, Britta Mccreedy, MD   HYDROcodone-acetaminophen (NORCO/VICODIN) 5-325 MG tablet Take 1 tablet by mouth every 6 (six) hours as needed for moderate pain. 12 tablet Kaelyn Innocent, Britta Mccreedy, MD     I have reviewed the PDMP during this encounter.   Merrilee Jansky, MD 03/09/20  1659

## 2020-09-17 ENCOUNTER — Ambulatory Visit (HOSPITAL_COMMUNITY): Payer: Self-pay

## 2020-10-31 ENCOUNTER — Encounter (HOSPITAL_COMMUNITY): Payer: Self-pay

## 2020-10-31 ENCOUNTER — Ambulatory Visit (HOSPITAL_COMMUNITY)
Admission: EM | Admit: 2020-10-31 | Discharge: 2020-10-31 | Disposition: A | Discharge status: Home / Self Care | Payer: Self-pay | Attending: Medical Oncology | Admitting: Medical Oncology

## 2020-10-31 DIAGNOSIS — K047 Periapical abscess without sinus: Secondary | ICD-10-CM

## 2020-10-31 DIAGNOSIS — K0889 Other specified disorders of teeth and supporting structures: Secondary | ICD-10-CM

## 2020-10-31 MED ORDER — GABAPENTIN 100 MG PO CAPS: 100.0000 mg | ORAL_CAPSULE | Freq: Every day | ORAL | 0 refills | Status: DC

## 2020-10-31 MED ORDER — AMOXICILLIN-POT CLAVULANATE 875-125 MG PO TABS: 1.0000 | ORAL_TABLET | Freq: Two times a day (BID) | ORAL | 0 refills | Status: DC

## 2020-10-31 NOTE — ED Provider Notes (Signed)
[facility]    CSN: [id number] Arrival date & time: 10/31/20  1604      History   Chief Complaint Chief Complaint  Patient presents with  . Dental Pain    HPI Timothy [name] is a 50 y.o. male.   HPI  Dental Pain: Patient reports that he has bad dentition.  He lost his insurance recently but is in the process of obtaining it again.  He states that he has had multiple dental abscesses and typically is able to drain them himself through using pressure.  He has had a recent abscess for the last few days and despite trying to get the pus with pressure he has not had much relief of the swelling and pain.  He has a low-grade fever today.  He denies any trouble breathing or swallowing.  No vomiting.  He has tried ice to help with the pain.  Past Medical History:  Diagnosis Date  . Carpal tunnel syndrome on both sides 03/28/2013  . Chronic headache   . DVT (deep venous thrombosis) (HCC)    2017-2018: 4 dvts  . GERD (gastroesophageal reflux disease)   . Helicobacter pylori gastritis 06/30/06  . 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 Pulmonary embolism (HCC)    2013: 2 total  . TIA (transient ischemic attack)     Patient Active Problem List   Diagnosis Date Noted  . Hyperhidrosis 01/15/2019  . 'light-for-dates' infant with signs of fetal malnutrition 12/06/2018  . Pulmonary embolism (HCC) 12/06/2018  . GERD (gastroesophageal reflux disease) 12/06/2018  . Urticaria   . Angioedema 04/16/2018  . Bipolar affect, depressed (HCC) 12/06/2015  . Severe opioid use disorder (HCC)   . Recurrent vertigo 02/12/2015  . Generalized anxiety disorder 12/31/2014  . Major depression, chronic 12/31/2014  . Hypersomnolence disorder, acute, moderate 12/31/2014  . Migraine without aura and without status migrainosus, not intractable 10/15/2014  . Benign essential tremor 10/15/2014  . Upper airway resistance syndrome 09/22/2014  . Carpal tunnel syndrome on both sides 03/28/2013  .  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 Pleuritic chest pain 11/22/2011  . Cough 11/22/2011  . Hypersomnia 11/05/2010    Past Surgical History:  Procedure Laterality Date  . KNEE SURGERY Right 10/2016       Home Medications    Prior to Admission medications   Medication Sig Start Date End Date Taking? Authorizing Provider  gabapentin (NEURONTIN) 100 MG capsule Take 1 capsule (100 mg total) by mouth at bedtime. 10/31/20  Yes Korinna Tat, Maralyn Sago M, PA-C  aluminum chloride (DRYSOL) 20 % external solution Apply topically at bedtime. 01/16/19   Yvette Rack, MD  amoxicillin-clavulanate (AUGMENTIN) 875-125 MG tablet Take 1 tablet by mouth every 12 (twelve) hours. 10/31/20   Rushie Chestnut, PA-C  cetirizine (ZYRTEC) 10 MG tablet Take 1 tablet (10 mg total) by mouth daily. 01/22/19   Kozlow, Alvira Philips, MD  EPINEPHrine (EPIPEN 2-PAK) 0.3 mg/0.3 mL IJ SOAJ injection Inject 0.3 mLs (0.3 mg total) into the muscle once as needed for up to 1 dose (for severe allergic reaction). CAll 911 immediately if you have to use this medicine 04/01/19   Kozlow, Alvira Philips, MD  famotidine (PEPCID) 40 MG tablet Take 1 tablet (40 mg total) by mouth daily. 12/25/18   Kozlow, Alvira Philips, MD  HYDROcodone-acetaminophen (NORCO/VICODIN) 5-325 MG tablet Take 1 tablet by mouth every 6 (six) hours as needed for moderate pain. 03/08/20   Merrilee Jansky, MD  methadone (DOLOPHINE) 10 MG/ML solution Take  45 mg by mouth daily.     [provider]  omeprazole (PRILOSEC) 40 MG capsule Take 1 capsule (40 mg total) by mouth 2 (two) times daily. 01/15/19   Yvette Rack, MD  propranolol ER (INDERAL LA) 60 MG 24 hr capsule Take 1 capsule (60 mg total) by mouth daily. 01/15/19 05/15/19  Yvette Rack, MD  rivaroxaban (XARELTO) 20 MG TABS tablet Take 1 tablet (20 mg total) by mouth daily with supper. 12/06/18   Masoudi, Shawna Orleans, MD  sertraline (ZOLOFT) 100 MG tablet Take 1 tablet (100 mg total) by mouth daily. 12/06/18 03/08/20  MasoudiShawna Orleans, MD    Family History Family History  Problem Relation Age of Onset  . Barrett's esophagus Mother   . Breast cancer Mother   . Irritable bowel syndrome Brother   . Allergies Brother   . Diabetes Brother   . Heart disease Father   . Heart attack Father   . Diabetes Sister   . Diabetes Paternal Grandfather   . Heart disease Paternal Grandfather   . Heart disease Paternal Grandmother   . Heart disease Maternal Grandmother   . Heart disease Maternal Grandfather   . Colon cancer Neg Hx   . Esophageal cancer Neg Hx     Social History Social History   Tobacco Use  . Smoking status: Never Smoker  . Smokeless tobacco: Former Clinical biochemist  . Vaping Use: Never used  Substance Use Topics  . Alcohol use: No    Alcohol/week: 0.0 standard drinks  . Drug use: Yes    Types: Other-see comments, Oxycodone, Heroin    Comment: Former Use - 12/04/15     Allergies   Ioxaglate, Ivp dye [iodinated diagnostic agents], Pork-derived products, and Shellfish allergy   Review of Systems Review of Systems  As stated above in HPI Physical Exam Triage Vital Signs ED Triage Vitals  Enc Vitals Group     BP 10/31/20 1643 139/80     Pulse Rate 10/31/20 1643 77     Resp 10/31/20 1643 20     Temp 10/31/20 1643 99.6 F (37.6 C)     Temp src --      SpO2 10/31/20 1643 98 %     Weight --      Height --      Head Circumference --      Peak Flow --      Pain Score 10/31/20 1639 8     Pain Loc --      Pain Edu? --      Excl. in GC? --    No data found.  Updated Vital Signs BP 139/80 (BP Location: Right Arm)   Pulse 77   Temp 99.6 F (37.6 C)   Resp 20   SpO2 98%   Physical Exam Vitals and nursing note reviewed.  Constitutional:      General: He is not in acute distress.    Appearance: Normal appearance. He is obese. He is not ill-appearing, toxic-appearing or diaphoretic.  HENT:     Head: Normocephalic and atraumatic.     Nose: Nose normal.     Mouth/Throat:      Mouth: Mucous membranes are moist.     Dentition: Abnormal dentition. Dental tenderness, dental abscesses and gum lesions present.     Tongue: No lesions.     Palate: No mass and lesions.     Pharynx: Oropharynx is clear.     Tonsils: No tonsillar exudate or tonsillar  abscesses.      Comments: No edema, bogginess or tenderness of superior or inferior palate Cardiovascular:     Rate and Rhythm: Normal rate and regular rhythm.     Heart sounds: Normal heart sounds.  Pulmonary:     Effort: Pulmonary effort is normal.     Breath sounds: Normal breath sounds.  Lymphadenopathy:     Cervical: No cervical adenopathy.  Neurological:     Mental Status: He is alert.      UC Treatments / Results  Labs (all labs ordered are listed, but only abnormal results are displayed) Labs Reviewed - No data to display  EKG   Radiology No results found.  Procedures Procedures (including critical care time)  Medications Ordered in UC Medications - No data to display  Initial Impression / Assessment and Plan / UC Course  I have reviewed the triage vital signs and the nursing notes.  Pertinent labs & imaging results that were available during my care of the patient were reviewed by me and considered in my medical decision making (see chart for details).     New.  Discussed the importance of dental care follow-up with patient and for now prescribing Augmentin to help with his infection.  Discussed how to use along with common potential side effects and precautions.  Discussed red flag signs and symptoms. He states that gabapentin has been prescribed in the past with success of dental pain and asks for a short supply.  Final Clinical Impressions(s) / UC Diagnoses   Final diagnoses:  Dental abscess  Pain, dental   Discharge Instructions   None    ED Prescriptions    Medication Sig Dispense Auth. Provider   amoxicillin-clavulanate (AUGMENTIN) 875-125 MG tablet Take 1 tablet by mouth every  12 (twelve) hours. 14 tablet Hjalmer Iovino M, PA-C   gabapentin (NEURONTIN) 100 MG capsule Take 1 capsule (100 mg total) by mouth at bedtime. 14 capsule Rushie Chestnut, [place]     PDMP not reviewed this encounter.   Rushie Chestnut, [place] 10/31/20 1743

## 2020-10-31 NOTE — ED Triage Notes (Signed)
Pt in with c/o dental abscess. Pt states he gets dental abscess often and the pain is everyday  Pt states he has been using cold ice to soothe pain

## 2020-12-08 ENCOUNTER — Encounter: Payer: Self-pay | Admitting: *Deleted

## 2020-12-13 ENCOUNTER — Ambulatory Visit (HOSPITAL_COMMUNITY): Payer: Self-pay

## 2020-12-18 ENCOUNTER — Encounter (HOSPITAL_COMMUNITY): Payer: Self-pay | Admitting: Emergency Medicine

## 2020-12-18 ENCOUNTER — Other Ambulatory Visit: Payer: Self-pay

## 2020-12-18 ENCOUNTER — Ambulatory Visit (HOSPITAL_COMMUNITY)
Admission: EM | Admit: 2020-12-18 | Discharge: 2020-12-18 | Disposition: A | Discharge status: Home / Self Care | Payer: Self-pay | Attending: Physician Assistant | Admitting: Physician Assistant

## 2020-12-18 DIAGNOSIS — K0889 Other specified disorders of teeth and supporting structures: Secondary | ICD-10-CM

## 2020-12-18 DIAGNOSIS — R030 Elevated blood-pressure reading, without diagnosis of hypertension: Secondary | ICD-10-CM

## 2020-12-18 DIAGNOSIS — K047 Periapical abscess without sinus: Secondary | ICD-10-CM

## 2020-12-18 MED ORDER — CLINDAMYCIN HCL 300 MG PO CAPS: 300.0000 mg | ORAL_CAPSULE | Freq: Three times a day (TID) | ORAL | 0 refills | Status: DC

## 2020-12-18 NOTE — Discharge Instructions (Addendum)
Please take clindamycin 300 mg 3 times a day to cover for dental infection.  It is very important that you follow-up with a dentist and if you are unable to get Medicaid quickly and may be worthwhile to see dentist we discussed.  Clindamycin can cause you to have an intestinal infection so if you develop any severe diarrhea please stop the medication and come back and see [place].  If you have any trouble speaking, swallowing, shortness of breath, swelling of your mouth please be seen immediately.  Your blood pressure was elevated as we discussed.  Once the pain has improved with the antibiotic use please monitor this and if this is consistently above 140/90 you need to return for reevaluation.  If you develop any chest pain, shortness of breath, vision changes, headache, dizziness with elevated blood pressure you need to go to the emergency room.

## 2020-12-18 NOTE — ED Provider Notes (Signed)
[facility]    CSN: [id number] Arrival date & time: 12/18/20  0803      History   Chief Complaint Chief Complaint  Patient presents with   Dental Problem    HPI Timothy [name] is a 50 y.o. male.   Patient presents today with a 2-week history of dental pain.  He has a history of poor dentition related to previous drug use and is missing several teeth at the gum level.  He reports getting frequent dental infections as a result of this and states current symptoms are similar to previous episodes of this condition.  Reports pain is rated 7 on a 0-10 pain scale, localized to right lower jaw, described as aching/throbbing, worse with mastication, no relieving factors identified.  Patient has been subsisting on soft foods.  He has not seen a dentist recently as he is in the process of obtaining Medicaid.  He denies any throat/tongue swelling, changes to voice, difficulty swallowing, fevers, dizziness, syncope.  He was recently treated approximately 6 weeks ago with Augmentin for similar symptoms and reports symptoms significantly improved but never completely resolved.  He denies additional antibiotic use.  Patient's blood pressure was noted to be elevated on intake today.  He believes this is related to pain.  He denies history of hypertension and has not taken antihypertensive medications recently.  He denies any increased caffeine intake, salt intake, decongestant use.  He denies any chest pain, shortness of breath, peripheral edema, headache, visual disturbance.   Past Medical History:  Diagnosis Date   Carpal tunnel syndrome on both sides 03/28/2013   Chronic headache    DVT (deep venous thrombosis) (HCC)    2017-2018: 4 dvts   GERD (gastroesophageal reflux disease)    Helicobacter pylori gastritis 06/30/06   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   Pulmonary embolism (HCC)    2013: 2 total   TIA (transient ischemic attack)     Patient Active Problem List   Diagnosis  Date Noted   Hyperhidrosis 01/15/2019   'light-for-dates' infant with signs of fetal malnutrition 12/06/2018   Pulmonary embolism (HCC) 12/06/2018   GERD (gastroesophageal reflux disease) 12/06/2018   Urticaria    Angioedema 04/16/2018   Bipolar affect, depressed (HCC) 12/06/2015   Severe opioid use disorder (HCC)    Recurrent vertigo 02/12/2015   Generalized anxiety disorder 12/31/2014   Major depression, chronic 12/31/2014   Hypersomnolence disorder, acute, moderate 12/31/2014   Migraine without aura and without status migrainosus, not intractable 10/15/2014   Benign essential tremor 10/15/2014   Upper airway resistance syndrome 09/22/2014   Carpal tunnel syndrome on both sides 03/28/2013   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   Pleuritic chest pain 11/22/2011   Cough 11/22/2011   Hypersomnia 11/05/2010    Past Surgical History:  Procedure Laterality Date   KNEE SURGERY Right 10/2016       Home Medications    Prior to Admission medications   Medication Sig Start Date End Date Taking? Authorizing Provider  clindamycin (CLEOCIN) 300 MG capsule Take 1 capsule (300 mg total) by mouth 3 (three) times daily. 12/18/20  Yes Chevy Virgo K, PA-C  aluminum chloride (DRYSOL) 20 % external solution Apply topically at bedtime. 01/16/19   Yvette RackAgyei, Obed K, MD  cetirizine (ZYRTEC) 10 MG tablet Take 1 tablet (10 mg total) by mouth daily. 01/22/19   Kozlow, Alvira PhilipsEric J, MD  EPINEPHrine (EPIPEN 2-PAK) 0.3 mg/0.3 mL IJ SOAJ injection Inject 0.3 mLs (0.3 mg total) into the muscle once as needed  for up to 1 dose (for severe allergic reaction). CAll 911 immediately if you have to use this medicine 04/01/19   Kozlow, Alvira Philips, MD  famotidine (PEPCID) 40 MG tablet Take 1 tablet (40 mg total) by mouth daily. 12/25/18   Kozlow, Alvira Philips, MD  gabapentin (NEURONTIN) 100 MG capsule Take 1 capsule (100 mg total) by mouth at bedtime. 10/31/20   Rushie Chestnut, PA-C  HYDROcodone-acetaminophen (NORCO/VICODIN) 5-325  MG tablet Take 1 tablet by mouth every 6 (six) hours as needed for moderate pain. 03/08/20   Merrilee Jansky, MD  methadone (DOLOPHINE) 10 MG/ML solution Take 45 mg by mouth daily.     [provider]  omeprazole (PRILOSEC) 40 MG capsule Take 1 capsule (40 mg total) by mouth 2 (two) times daily. 01/15/19   Yvette Rack, MD  propranolol ER (INDERAL LA) 60 MG 24 hr capsule Take 1 capsule (60 mg total) by mouth daily. 01/15/19 05/15/19  Yvette Rack, MD  rivaroxaban (XARELTO) 20 MG TABS tablet Take 1 tablet (20 mg total) by mouth daily with supper. 12/06/18   Masoudi, Shawna Orleans, MD  sertraline (ZOLOFT) 100 MG tablet Take 1 tablet (100 mg total) by mouth daily. 12/06/18 03/08/20  MasoudiShawna Orleans, MD    Family History Family History  Problem Relation Age of Onset   Barrett's esophagus Mother    Breast cancer Mother    Irritable bowel syndrome Brother    Allergies Brother    Diabetes Brother    Heart disease Father    Heart attack Father    Diabetes Sister    Diabetes Paternal Grandfather    Heart disease Paternal Grandfather    Heart disease Paternal Grandmother    Heart disease Maternal Grandmother    Heart disease Maternal Grandfather    Colon cancer Neg Hx    Esophageal cancer Neg Hx     Social History Social History   Tobacco Use   Smoking status: Never   Smokeless tobacco: Former    Quit date: 2013  Vaping Use   Vaping Use: Never used  Substance Use Topics   Alcohol use: No    Alcohol/week: 0.0 standard drinks   Drug use: Yes    Types: Other-see comments, Oxycodone, Heroin    Comment: Former Use - 12/04/15     Allergies   Ioxaglate, Ivp dye [iodinated diagnostic agents], Pork-derived products, and Shellfish allergy   Review of Systems Review of Systems  Constitutional:  Negative for activity change, appetite change, fatigue and fever.  HENT:  Positive for dental problem. Negative for drooling, sore throat, trouble swallowing and voice change.   Eyes:   Negative for visual disturbance.  Respiratory:  Negative for cough and shortness of breath.   Cardiovascular:  Negative for chest pain.  Gastrointestinal:  Negative for abdominal pain, diarrhea, nausea and vomiting.  Neurological:  Negative for dizziness, light-headedness and headaches.    Physical Exam Triage Vital Signs ED Triage Vitals  Enc Vitals Group     BP 12/18/20 0811 (!) 161/102     Pulse Rate 12/18/20 0811 77     Resp 12/18/20 0811 16     Temp 12/18/20 0811 99.3 F (37.4 C)     Temp Source 12/18/20 0811 Oral     SpO2 12/18/20 0811 93 %     Weight --      Height --      Head Circumference --      Peak Flow --  Pain Score 12/18/20 0812 7     Pain Loc --      Pain Edu? --      Excl. in GC? --    No data found.  Updated Vital Signs BP 138/85 (BP Location: Right Arm)   Pulse 77   Temp 99.3 F (37.4 C) (Oral)   Resp 16   SpO2 93%   Visual Acuity Right Eye Distance:   Left Eye Distance:   Bilateral Distance:    Right Eye Near:   Left Eye Near:    Bilateral Near:     Physical Exam Vitals reviewed.  Constitutional:      General: He is awake.     Appearance: Normal appearance. He is normal weight. He is not ill-appearing.     Comments: Very pleasant male appears stated age in no acute distress sitting comfortably on exam table  HENT:     Head: Normocephalic and atraumatic.     Right Ear: External ear normal.     Left Ear: External ear normal.     Nose: Nose normal.     Mouth/Throat:     Dentition: Abnormal dentition. Dental tenderness, gingival swelling, dental caries and dental abscesses present.     Pharynx: Uvula midline. No oropharyngeal exudate or posterior oropharyngeal erythema.      Comments: Patient has been seeing numerous teeth to the gum level with surrounding erythema and bogginess of gingiva.  No evidence of Ludwig angina. Cardiovascular:     Rate and Rhythm: Normal rate and regular rhythm.     Heart sounds: Normal heart sounds, S1  normal and S2 normal. No murmur heard. Pulmonary:     Effort: Pulmonary effort is normal. No accessory muscle usage or respiratory distress.     Breath sounds: Normal breath sounds. No stridor. No wheezing, rhonchi or rales.     Comments: Clear to auscultation bilaterally Abdominal:     General: Bowel sounds are normal.     Palpations: Abdomen is soft.     Tenderness: There is no abdominal tenderness.  Neurological:     Mental Status: He is alert.  Psychiatric:        Behavior: Behavior is cooperative.     UC Treatments / Results  Labs (all labs ordered are listed, but only abnormal results are displayed) Labs Reviewed - No data to display  EKG   Radiology No results found.  Procedures Procedures (including critical care time)  Medications Ordered in UC Medications - No data to display  Initial Impression / Assessment and Plan / UC Course  I have reviewed the triage vital signs and the nursing notes.  Pertinent labs & imaging results that were available during my care of the patient were reviewed by me and considered in my medical decision making (see chart for details).      Dental infection noted on exam.  Patient has recently taken several courses of Augmentin not had complete resolution of symptoms we will try clindamycin.  Discussed potential risk of C. difficile with this medication and encourage patient to follow-up with our clinic if he develops any diarrhea.  Discussed the importance of following up with a dentist as you likely have recurrent infection symptoms until underlying dental abnormalities are managed.  He can use over-the-counter medications for symptom management.  Discussed alarm symptoms that warrant emergent evaluation.  Strict return precautions given to which patient expressed understanding.  Blood pressure was noted to be elevated today likely due to pain.  Patient was  encouraged to monitor this at home and if this is persistently above 140/90  after dental infection is treated needs to return for reevaluation.  Recommended he avoid caffeine, decongestants, salt.  Discussed alarm symptoms that warrant emergent evaluation.  Strict return precautions given to which patient expressed understanding.  Final Clinical Impressions(s) / UC Diagnoses   Final diagnoses:  Dental infection  Dentalgia  Elevated blood pressure reading     Discharge Instructions      Please take clindamycin 300 mg 3 times a day to cover for dental infection.  It is very important that you follow-up with a dentist and if you are unable to get Medicaid quickly and may be worthwhile to see dentist we discussed.  Clindamycin can cause you to have an intestinal infection so if you develop any severe diarrhea please stop the medication and come back and see [place].  If you have any trouble speaking, swallowing, shortness of breath, swelling of your mouth please be seen immediately.  Your blood pressure was elevated as we discussed.  Once the pain has improved with the antibiotic use please monitor this and if this is consistently above 140/90 you need to return for reevaluation.  If you develop any chest pain, shortness of breath, vision changes, headache, dizziness with elevated blood pressure you need to go to the emergency room.     ED Prescriptions     Medication Sig Dispense Auth. Provider   clindamycin (CLEOCIN) 300 MG capsule Take 1 capsule (300 mg total) by mouth 3 (three) times daily. 21 capsule Jaylea Plourde K, PA-C      PDMP not reviewed this encounter.   Jeani Hawking, PA-C 12/18/20 [id number]

## 2020-12-18 NOTE — ED Triage Notes (Signed)
C/o dental abscess x 2 weeks, asking for antibiotic.

## 2021-01-16 ENCOUNTER — Other Ambulatory Visit: Payer: Self-pay

## 2021-01-16 ENCOUNTER — Ambulatory Visit (HOSPITAL_COMMUNITY)
Admission: EM | Admit: 2021-01-16 | Discharge: 2021-01-16 | Disposition: A | Discharge status: Home / Self Care | Payer: Self-pay | Attending: Student | Admitting: Student

## 2021-01-16 ENCOUNTER — Encounter (HOSPITAL_COMMUNITY): Payer: Self-pay | Admitting: Emergency Medicine

## 2021-01-16 DIAGNOSIS — K047 Periapical abscess without sinus: Secondary | ICD-10-CM

## 2021-01-16 DIAGNOSIS — S8001XA Contusion of right knee, initial encounter: Secondary | ICD-10-CM

## 2021-01-16 MED ORDER — KETOROLAC TROMETHAMINE 10 MG PO TABS: 10.0000 mg | ORAL_TABLET | Freq: Four times a day (QID) | ORAL | 0 refills | Status: DC | PRN

## 2021-01-16 MED ORDER — CEPHALEXIN 500 MG PO CAPS: 500.0000 mg | ORAL_CAPSULE | Freq: Four times a day (QID) | ORAL | 0 refills | Status: DC
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
## 2021-01-16 NOTE — Discharge Instructions (Addendum)
-  Start the antibiotic: Keflex, 4x daily x5 days. You can take this with food if you have a sensitive stomach. -Toradol (Ketorolac), one pill up to every 6 hours for pain. Make sure to take this with food. Avoid other NSAIDs like ibuprofen, alleve, naproxen while taking this medication.  -You can also take Tylenol 1000mg  up to 3x daily -For your knee- rest, ice, elevation -Establish care with dentist when possible
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
## 2021-01-22 ENCOUNTER — Emergency Department (HOSPITAL_BASED_OUTPATIENT_CLINIC_OR_DEPARTMENT_OTHER): Payer: Self-pay

## 2021-01-22 ENCOUNTER — Emergency Department (HOSPITAL_BASED_OUTPATIENT_CLINIC_OR_DEPARTMENT_OTHER)
Admission: EM | Admit: 2021-01-22 | Discharge: 2021-01-22 | Disposition: A | Discharge status: Home / Self Care | Payer: Self-pay | Attending: Emergency Medicine | Admitting: Emergency Medicine

## 2021-01-22 ENCOUNTER — Other Ambulatory Visit: Payer: Self-pay

## 2021-01-22 ENCOUNTER — Encounter (HOSPITAL_BASED_OUTPATIENT_CLINIC_OR_DEPARTMENT_OTHER): Payer: Self-pay | Admitting: *Deleted

## 2021-01-22 DIAGNOSIS — Z7984 Long term (current) use of oral hypoglycemic drugs: Secondary | ICD-10-CM | POA: Insufficient documentation

## 2021-01-22 DIAGNOSIS — E119 Type 2 diabetes mellitus without complications: Secondary | ICD-10-CM | POA: Insufficient documentation

## 2021-01-22 DIAGNOSIS — Z87891 Personal history of nicotine dependence: Secondary | ICD-10-CM | POA: Insufficient documentation

## 2021-01-22 LAB — CBG MONITORING, ED
Glucose-Capillary: 370 mg/dL — ABNORMAL HIGH (ref 70–99)
Glucose-Capillary: 264 mg/dL — ABNORMAL HIGH (ref 70–99)
Glucose-Capillary: 269 mg/dL — ABNORMAL HIGH (ref 70–99)

## 2021-01-22 LAB — CBC
HCT: 40.8 % (ref 39.0–52.0)
Hemoglobin: 13.3 g/dL (ref 13.0–17.0)
MCH: 29.4 pg (ref 26.0–34.0)
MCHC: 32.6 g/dL (ref 30.0–36.0)
MCV: 90.1 fL (ref 80.0–100.0)
Platelets: 145 10*3/uL — ABNORMAL LOW (ref 150–400)
RBC: 4.53 MIL/uL (ref 4.22–5.81)
RDW: 14.5 % (ref 11.5–15.5)
WBC: 8 10*3/uL (ref 4.0–10.5)
nRBC: 0 % (ref 0.0–0.2)

## 2021-01-22 LAB — URINALYSIS, ROUTINE W REFLEX MICROSCOPIC
Bilirubin Urine: NEGATIVE
Glucose, UA: >1000 mg/dL — AB
Hgb urine dipstick: NEGATIVE
Ketones, ur: NEGATIVE mg/dL
Leukocytes,Ua: NEGATIVE
Nitrite: NEGATIVE
Protein, ur: NEGATIVE mg/dL
Specific Gravity, Urine: 1.04 — ABNORMAL HIGH (ref 1.005–1.030)
pH: 5.5 (ref 5.0–8.0)

## 2021-01-22 LAB — BASIC METABOLIC PANEL
Anion gap: 8 (ref 5–15)
BUN: 17 mg/dL (ref 6–20)
CO2: 29 mmol/L (ref 22–32)
Calcium: 9.3 mg/dL (ref 8.9–10.3)
Chloride: 100 mmol/L (ref 98–111)
Creatinine, Ser: 0.88 mg/dL (ref 0.61–1.24)
GFR, Estimated: >60 mL/min (ref >60)
Glucose, Bld: 355 mg/dL — ABNORMAL HIGH (ref 70–99)
Potassium: 4.5 mmol/L (ref 3.5–5.1)
Sodium: 137 mmol/L (ref 135–145)

## 2021-01-22 LAB — I-STAT VENOUS BLOOD GAS, ED
Acid-Base Excess: 1 mmol/L (ref 0.0–2.0)
Bicarbonate: 28.7 mmol/L — ABNORMAL HIGH (ref 20.0–28.0)
Calcium, Ion: 1.26 mmol/L (ref 1.15–1.40)
HCT: 42 % (ref 39.0–52.0)
Hemoglobin: 14.3 g/dL (ref 13.0–17.0)
O2 Saturation: 26 %
Patient temperature: 98.6
Potassium: 4.4 mmol/L (ref 3.5–5.1)
Sodium: 137 mmol/L (ref 135–145)
TCO2: 30 mmol/L (ref 22–32)
pCO2, Ven: 55.6 mmHg (ref 44.0–60.0)
pH, Ven: 7.32 (ref 7.250–7.430)
pO2, Ven: 20 mmHg — CL (ref 32.0–45.0)

## 2021-01-22 MED ORDER — SODIUM CHLORIDE 0.9 % IV BOLUS
1000.0000 mL | Freq: Once | INTRAVENOUS | Status: AC
  Administered 2021-01-22: 1000 mL via INTRAVENOUS

## 2021-01-22 MED ORDER — METFORMIN HCL 500 MG PO TABS: 500.0000 mg | ORAL_TABLET | Freq: Two times a day (BID) | ORAL | 0 refills | Status: DC

## 2021-01-22 NOTE — Discharge Instructions (Addendum)
Check your sugars regularly.  Start taking the metformin as soon as possible.  If you do not hear from the internal medicine clinic call on Monday to make an appointment.  Return here as needed if you have any worsening symptoms.

## 2021-01-22 NOTE — ED Notes (Signed)
RT Note: Pt. to room-12 from lobby, v/s updated from triage.

## 2021-01-22 NOTE — ED Provider Notes (Signed)
MSE was initiated and I personally evaluated the patient and placed orders (if any) at  2:37 PM on January 22, 2021.  The patient appears stable so that the remainder of the MSE may be completed by another provider.  Pt has had extreme thirst and frequent urination for a week.  No hx of DM.  Pt's parents have DM, so he checked his BS and it was 590.  BS 370 in triage.  Labs ordered and fluids started.   Jacalyn Lefevre, MD 01/22/21 [phone]

## 2021-01-22 NOTE — ED Notes (Signed)
CXR at bedside

## 2021-01-22 NOTE — ED Triage Notes (Signed)
Pt states for 2 weeks blurry vision with extreme  thirst and frequent urination for a week. CBG 590 at home now in triage 370

## 2021-01-22 NOTE — ED Provider Notes (Signed)
[facility] EMERGENCY DEPT Provider Note   CSN: [id number] Arrival date & time: 01/22/21  1257     History Chief Complaint  Patient presents with   Hyperglycemia   Blurred Vision    Timothy [name] is a 50 y.o. male.  Patient is a 50 year old male who has a prior history of DVT/PE, bipolar disorder, GERD who presents with elevated blood sugar.  He said he has had some blurry vision for the last 2 weeks and started feeling fatigued yesterday.  He checked his blood sugar today and it was in the 500 range.  He does not have a known diagnosis of diabetes but his parents do have diabetes.  He has not checked his blood sugar previously.  He does say that he normally drinks about 2 large cherry Cokes per day.  He denies any recent illnesses.  No fevers, cough or cold symptoms.  He has some ongoing teeth problems but nothing new.  No swelling or fevers.  No drainage from his teeth.  No nausea or vomiting.  No abdominal pain.  He does have some increased thirst and increased urination.      Past Medical History:  Diagnosis Date   Carpal tunnel syndrome on both sides 03/28/2013   Chronic headache    DVT (deep venous thrombosis) (HCC)    2017-2018: 4 dvts   GERD (gastroesophageal reflux disease)    Helicobacter pylori gastritis 06/30/06   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   Pulmonary embolism (HCC)    2013: 2 total   TIA (transient ischemic attack)     Patient Active Problem List   Diagnosis Date Noted   Hyperhidrosis 01/15/2019   'light-for-dates' infant with signs of fetal malnutrition 12/06/2018   Pulmonary embolism (HCC) 12/06/2018   GERD (gastroesophageal reflux disease) 12/06/2018   Urticaria    Angioedema 04/16/2018   Bipolar affect, depressed (HCC) 12/06/2015   Severe opioid use disorder (HCC)    Recurrent vertigo 02/12/2015   Generalized anxiety disorder 12/31/2014   Major depression, chronic 12/31/2014   Hypersomnolence disorder, acute, moderate  12/31/2014   Migraine without aura and without status migrainosus, not intractable 10/15/2014   Benign essential tremor 10/15/2014   Upper airway resistance syndrome 09/22/2014   Carpal tunnel syndrome on both sides 03/28/2013   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   Pleuritic chest pain 11/22/2011   Cough 11/22/2011   Hypersomnia 11/05/2010    Past Surgical History:  Procedure Laterality Date   KNEE SURGERY Right 10/2016       Family History  Problem Relation Age of Onset   Barrett's esophagus Mother    Breast cancer Mother    Irritable bowel syndrome Brother    Allergies Brother    Diabetes Brother    Heart disease Father    Heart attack Father    Diabetes Sister    Diabetes Paternal Grandfather    Heart disease Paternal Grandfather    Heart disease Paternal Grandmother    Heart disease Maternal Grandmother    Heart disease Maternal Grandfather    Colon cancer Neg Hx    Esophageal cancer Neg Hx     Social History   Tobacco Use   Smoking status: Never   Smokeless tobacco: Former    Quit date: 2013  Vaping Use   Vaping Use: Former  Substance Use Topics   Alcohol use: No    Alcohol/week: 0.0 standard drinks   Drug use: Not Currently    Types: Other-see comments, Oxycodone, Heroin  Comment: Former Use - 12/04/15    Home Medications Prior to Admission medications   Medication Sig Start Date End Date Taking? Authorizing Provider  cephALEXin (KEFLEX) 500 MG capsule Take 1 capsule (500 mg total) by mouth 4 (four) times daily. 01/16/21  Yes Rhys MartiniGraham, Laura E, PA-C  metFORMIN (GLUCOPHAGE) 500 MG tablet Take 1 tablet (500 mg total) by mouth 2 (two) times daily with a meal. 01/22/21  Yes Rolan BuccoBelfi, Yahayra Geis, MD  cetirizine (ZYRTEC) 10 MG tablet Take 1 tablet (10 mg total) by mouth daily. 01/22/19   Kozlow, Alvira PhilipsEric J, MD  EPINEPHrine (EPIPEN 2-PAK) 0.3 mg/0.3 mL IJ SOAJ injection Inject 0.3 mLs (0.3 mg total) into the muscle once as needed for up to 1 dose (for severe allergic  reaction). CAll 911 immediately if you have to use this medicine 04/01/19   Kozlow, Alvira PhilipsEric J, MD  ketorolac (TORADOL) 10 MG tablet Take 1 tablet (10 mg total) by mouth every 6 (six) hours as needed. 01/16/21   Rhys MartiniGraham, Laura E, PA-C  methadone (DOLOPHINE) 10 MG/ML solution Take 45 mg by mouth daily.     [provider]  sertraline (ZOLOFT) 100 MG tablet Take 1 tablet (100 mg total) by mouth daily. 12/06/18 03/08/20  Masoudi, Shawna OrleansElhamalsadat, MD    Allergies    Ioxaglate, Ivp dye [iodinated diagnostic agents], Pork-derived products, and Shellfish allergy  Review of Systems   Review of Systems  Constitutional:  Positive for fatigue. Negative for chills, diaphoresis and fever.  HENT:  Negative for congestion, rhinorrhea and sneezing.   Eyes:  Positive for visual disturbance.  Respiratory:  Negative for cough, chest tightness and shortness of breath.   Cardiovascular:  Negative for chest pain and leg swelling.  Gastrointestinal:  Negative for abdominal pain, blood in stool, diarrhea, nausea and vomiting.  Endocrine: Positive for polydipsia and polyuria.  Genitourinary:  Positive for frequency. Negative for difficulty urinating, flank pain and hematuria.  Musculoskeletal:  Negative for arthralgias and back pain.  Skin:  Negative for rash.  Neurological:  Negative for dizziness, speech difficulty, weakness, numbness and headaches.   Physical Exam Updated Vital Signs BP 127/90   Pulse 78   Temp 98.6 F (37 C) (Oral)   Resp 16   Ht 6\' 4"  (1.93 m)   Wt 127 kg   SpO2 94%   BMI 34.08 kg/m   Physical Exam Constitutional:      Appearance: He is well-developed.  HENT:     Head: Normocephalic and atraumatic.     Mouth/Throat:     Comments: Poor dentition Eyes:     Pupils: Pupils are equal, round, and reactive to light.  Cardiovascular:     Rate and Rhythm: Normal rate and regular rhythm.     Heart sounds: Normal heart sounds.  Pulmonary:     Effort: Pulmonary effort is normal. No  respiratory distress.     Breath sounds: Normal breath sounds. No wheezing or rales.  Chest:     Chest wall: No tenderness.  Abdominal:     General: Bowel sounds are normal.     Palpations: Abdomen is soft.     Tenderness: There is no abdominal tenderness. There is no guarding or rebound.  Musculoskeletal:        General: Normal range of motion.     Cervical back: Normal range of motion and neck supple.  Lymphadenopathy:     Cervical: No cervical adenopathy.  Skin:    General: Skin is warm and dry.  Findings: No rash.  Neurological:     Mental Status: He is alert and oriented to person, place, and time.    ED Results / Procedures / Treatments   Labs (all labs ordered are listed, but only abnormal results are displayed) Labs Reviewed  BASIC METABOLIC PANEL - Abnormal; Notable for the following components:      Result Value   Glucose, Bld 355 (*)    All other components within normal limits  CBC - Abnormal; Notable for the following components:   Platelets 145 (*)    All other components within normal limits  URINALYSIS, ROUTINE W REFLEX MICROSCOPIC - Abnormal; Notable for the following components:   Specific Gravity, Urine 1.040 (*)    Glucose, UA >1,000 (*)    All other components within normal limits  CBG MONITORING, ED - Abnormal; Notable for the following components:   Glucose-Capillary 370 (*)    All other components within normal limits  CBG MONITORING, ED - Abnormal; Notable for the following components:   Glucose-Capillary 264 (*)    All other components within normal limits  I-STAT VENOUS BLOOD GAS, ED - Abnormal; Notable for the following components:   pO2, Ven 20.0 (*)    Bicarbonate 28.7 (*)    All other components within normal limits  BLOOD GAS, VENOUS  CBG MONITORING, ED  CBG MONITORING, ED    EKG EKG Interpretation  Date/Time:  Friday January 22 2021 14:50:51 EDT Ventricular Rate:  72 PR Interval:  162 QRS Duration: 111 QT Interval:  414 QTC  Calculation: 454 R Axis:   -3 Text Interpretation: Sinus rhythm Borderline low voltage, extremity leads RSR' in V1 or V2, probably normal variant Confirmed by Rolan Bucco [phone]) on 01/22/2021 5:33:26 PM  Radiology DG Chest Portable 1 View  Result Date: 01/22/2021 CLINICAL DATA:  Hyperglycemia.  Two weeks of blurry vision. EXAM: PORTABLE CHEST 1 VIEW COMPARISON:  Chest x-ray 04/16/2018, CT chest 04/13/2017 FINDINGS: The heart and mediastinal contours are within normal limits. No focal consolidation. No pulmonary edema. No pleural effusion. No pneumothorax. No acute osseous abnormality. IMPRESSION: No active disease. Electronically Signed   By: Tish Frederickson M.D.   On: 01/22/2021 15:38    Procedures Procedures   Medications Ordered in ED Medications  sodium chloride 0.9 % bolus 1,000 mL (0 mLs Intravenous Stopped 01/22/21 1608)  sodium chloride 0.9 % bolus 1,000 mL (1,000 mLs Intravenous New Bag/Given 01/22/21 1649)    ED Course  I have reviewed the triage vital signs and the nursing notes.  Pertinent labs & imaging results that were available during my care of the patient were reviewed by me and considered in my medical decision making (see chart for details).    MDM Rules/Calculators/A&P                           Patient is a 50 year old male who presents with elevated blood sugars.  He is otherwise well-appearing.  His blood sugars were elevated but there is no suggestions of DKA.  His sugar has improved into the 200 range with IV fluids.  He has no vomiting or other significant symptoms.  He has previously been seen in the internal medicine resident clinic.  I did contact the resident on-call who will send a message to schedule him for a close follow-up appointment.  I will start him on metformin.  He was given nutritional education.  He has family members who have diabetes so he  can check his blood sugars at home.  Return precautions were given.  He does not have any fevers or other  suggestions of infection. Final Clinical Impression(s) / ED Diagnoses Final diagnoses:  New onset type 2 diabetes mellitus (HCC)    Rx / DC Orders ED Discharge Orders          Ordered    metFORMIN (GLUCOPHAGE) 500 MG tablet  2 times daily with meals        01/22/21 1730             Rolan Bucco, MD 01/22/21 1733

## 2021-01-27 ENCOUNTER — Encounter: Payer: Self-pay | Admitting: Student

## 2021-01-31 ENCOUNTER — Encounter (HOSPITAL_BASED_OUTPATIENT_CLINIC_OR_DEPARTMENT_OTHER): Payer: Self-pay | Admitting: Urology

## 2021-01-31 ENCOUNTER — Emergency Department (HOSPITAL_BASED_OUTPATIENT_CLINIC_OR_DEPARTMENT_OTHER)
Admission: EM | Admit: 2021-01-31 | Discharge: 2021-01-31 | Disposition: A | Discharge status: Home / Self Care | Payer: Self-pay | Attending: Emergency Medicine | Admitting: Emergency Medicine

## 2021-01-31 ENCOUNTER — Other Ambulatory Visit: Payer: Self-pay

## 2021-01-31 DIAGNOSIS — Z86718 Personal history of other venous thrombosis and embolism: Secondary | ICD-10-CM | POA: Insufficient documentation

## 2021-01-31 DIAGNOSIS — R2241 Localized swelling, mass and lump, right lower limb: Secondary | ICD-10-CM | POA: Insufficient documentation

## 2021-01-31 DIAGNOSIS — R739 Hyperglycemia, unspecified: Secondary | ICD-10-CM

## 2021-01-31 DIAGNOSIS — M7989 Other specified soft tissue disorders: Secondary | ICD-10-CM

## 2021-01-31 DIAGNOSIS — E781 Pure hyperglyceridemia: Secondary | ICD-10-CM | POA: Insufficient documentation

## 2021-01-31 LAB — BASIC METABOLIC PANEL
Anion gap: 10 (ref 5–15)
BUN: 16 mg/dL (ref 6–20)
CO2: 25 mmol/L (ref 22–32)
Calcium: 9.4 mg/dL (ref 8.9–10.3)
Chloride: 104 mmol/L (ref 98–111)
Creatinine, Ser: 0.69 mg/dL (ref 0.61–1.24)
GFR, Estimated: >60 mL/min (ref >60)
Glucose, Bld: 209 mg/dL — ABNORMAL HIGH (ref 70–99)
Potassium: 4.2 mmol/L (ref 3.5–5.1)
Sodium: 139 mmol/L (ref 135–145)

## 2021-01-31 LAB — CBC WITH DIFFERENTIAL/PLATELET
Abs Immature Granulocytes: 0.01 10*3/uL (ref 0.00–0.07)
Basophils Absolute: 0 10*3/uL (ref 0.0–0.1)
Basophils Relative: 0 %
Eosinophils Absolute: 0.2 10*3/uL (ref 0.0–0.5)
Eosinophils Relative: 2 %
HCT: 39.1 % (ref 39.0–52.0)
Hemoglobin: 12.7 g/dL — ABNORMAL LOW (ref 13.0–17.0)
Immature Granulocytes: 0 %
Lymphocytes Relative: 29 %
Lymphs Abs: 2.2 10*3/uL (ref 0.7–4.0)
MCH: 29.5 pg (ref 26.0–34.0)
MCHC: 32.5 g/dL (ref 30.0–36.0)
MCV: 90.9 fL (ref 80.0–100.0)
Monocytes Absolute: 0.6 10*3/uL (ref 0.1–1.0)
Monocytes Relative: 8 %
Neutro Abs: 4.6 10*3/uL (ref 1.7–7.7)
Neutrophils Relative %: 61 %
Platelets: 141 10*3/uL — ABNORMAL LOW (ref 150–400)
RBC: 4.3 MIL/uL (ref 4.22–5.81)
RDW: 14.7 % (ref 11.5–15.5)
WBC: 7.7 10*3/uL (ref 4.0–10.5)
nRBC: 0 % (ref 0.0–0.2)

## 2021-01-31 MED ORDER — RIVAROXABAN 15 MG PO TABS
15.0000 mg | ORAL_TABLET | Freq: Once | ORAL | Status: AC
  Administered 2021-01-31: 15 mg via ORAL
  Filled 2021-01-31: qty 1

## 2021-01-31 MED ORDER — RIVAROXABAN (XARELTO) VTE STARTER PACK (15 & 20 MG): ORAL_TABLET | ORAL | 0 refills | Status: DC

## 2021-01-31 NOTE — ED Triage Notes (Signed)
Right leg swelling and pain x 1 week, h/o blood clots, pain behind the right knee.

## 2021-01-31 NOTE — ED Notes (Signed)
[place] scheduled for tomorrow at 1300. Instructed patient where to go for [place].

## 2021-01-31 NOTE — ED Provider Notes (Signed)
[facility] EMERGENCY DEPT Provider Note   CSN: [id number] Arrival date & time: 01/31/21  1634     History Chief Complaint  Patient presents with   Leg Swelling    Timothy [name] is a 50 y.o. male.  History of multiple DVTs and PEs.  Was on Xarelto but lost his insurance and can no longer afford it.  He is in the process of filing for Medicaid.  About a week ago he started having pain and swelling of the right lower extremity.  The history is provided by the patient.  Leg Pain Location:  Leg Leg location:  R lower leg Pain details:    Quality:  Aching   Radiates to:  Does not radiate   Severity:  Moderate   Onset quality:  Sudden   Duration:  1 week   Timing:  Constant   Progression:  Worsening Chronicity:  New Relieved by:  Nothing Worsened by:  Nothing Ineffective treatments:  None tried Associated symptoms: swelling   Associated symptoms: no back pain and no fever       Past Medical History:  Diagnosis Date   Carpal tunnel syndrome on both sides 03/28/2013   Chronic headache    DVT (deep venous thrombosis) (HCC)    2017-2018: 4 dvts   GERD (gastroesophageal reflux disease)    Helicobacter pylori gastritis 06/30/06   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   Pulmonary embolism (HCC)    2013: 2 total   TIA (transient ischemic attack)     Patient Active Problem List   Diagnosis Date Noted   Pulmonary embolism (HCC) 12/06/2018   GERD (gastroesophageal reflux disease) 12/06/2018   Angioedema 04/16/2018   Bipolar affect, depressed (HCC) 12/06/2015   Severe opioid use disorder (HCC)    Generalized anxiety disorder 12/31/2014   Major depression, chronic 12/31/2014   Migraine without aura and without status migrainosus, not intractable 10/15/2014   Benign essential tremor 10/15/2014   Upper airway resistance syndrome 09/22/2014   Carpal tunnel syndrome on both sides 03/28/2013   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   Hypersomnia 11/05/2010     Past Surgical History:  Procedure Laterality Date   KNEE SURGERY Right 10/2016       Family History  Problem Relation Age of Onset   Barrett's esophagus Mother    Breast cancer Mother    Irritable bowel syndrome Brother    Allergies Brother    Diabetes Brother    Heart disease Father    Heart attack Father    Diabetes Sister    Diabetes Paternal Grandfather    Heart disease Paternal Grandfather    Heart disease Paternal Grandmother    Heart disease Maternal Grandmother    Heart disease Maternal Grandfather    Colon cancer Neg Hx    Esophageal cancer Neg Hx     Social History   Tobacco Use   Smoking status: Never   Smokeless tobacco: Former    Quit date: 2013  Vaping Use   Vaping Use: Former  Substance Use Topics   Alcohol use: No    Alcohol/week: 0.0 standard drinks   Drug use: Not Currently    Types: Other-see comments, Oxycodone, Heroin    Comment: Former Use - 12/04/15    Home Medications Prior to Admission medications   Medication Sig Start Date End Date Taking? Authorizing Provider  cephALEXin (KEFLEX) 500 MG capsule Take 1 capsule (500 mg total) by mouth 4 (four) times daily. 01/16/21   Rhys Martini, PA-C  cetirizine (ZYRTEC) 10 MG tablet Take 1 tablet (10 mg total) by mouth daily. 01/22/19   Kozlow, Alvira Philips, MD  EPINEPHrine (EPIPEN 2-PAK) 0.3 mg/0.3 mL IJ SOAJ injection Inject 0.3 mLs (0.3 mg total) into the muscle once as needed for up to 1 dose (for severe allergic reaction). CAll 911 immediately if you have to use this medicine 04/01/19   Kozlow, Alvira Philips, MD  ketorolac (TORADOL) 10 MG tablet Take 1 tablet (10 mg total) by mouth every 6 (six) hours as needed. 01/16/21   Rhys Martini, PA-C  metFORMIN (GLUCOPHAGE) 500 MG tablet Take 1 tablet (500 mg total) by mouth 2 (two) times daily with a meal. 01/22/21   Rolan Bucco, MD  methadone (DOLOPHINE) 10 MG/ML solution Take 45 mg by mouth daily.     [provider]  sertraline (ZOLOFT) 100 MG  tablet Take 1 tablet (100 mg total) by mouth daily. 12/06/18 03/08/20  Masoudi, Shawna Orleans, MD    Allergies    Ioxaglate, Ivp dye [iodinated diagnostic agents], Pork-derived products, and Shellfish allergy  Review of Systems   Review of Systems  Constitutional:  Negative for chills and fever.  HENT:  Negative for ear pain and sore throat.   Eyes:  Negative for pain and visual disturbance.  Respiratory:  Negative for cough and shortness of breath.   Cardiovascular:  Positive for leg swelling. Negative for chest pain and palpitations.  Gastrointestinal:  Negative for abdominal pain and vomiting.  Genitourinary:  Negative for dysuria and hematuria.  Musculoskeletal:  Negative for arthralgias and back pain.  Skin:  Negative for color change and rash.  Neurological:  Negative for seizures and syncope.  All other systems reviewed and are negative.  Physical Exam Updated Vital Signs BP (!) 152/81   Pulse 77   Temp 98.4 F (36.9 C) (Oral)   Resp 17   Ht 6\' 4"  (1.93 m)   Wt 120.2 kg   SpO2 98%   BMI 32.26 kg/m   Physical Exam Vitals and nursing note reviewed.  Constitutional:      Appearance: Normal appearance.  HENT:     Head: Normocephalic and atraumatic.  Eyes:     Conjunctiva/sclera: Conjunctivae normal.  Pulmonary:     Effort: Pulmonary effort is normal. No respiratory distress.  Musculoskeletal:        General: No deformity. Normal range of motion.     Cervical back: Normal range of motion.     Comments: Right lower leg is diffusely swollen and tender to palpation at the posterior lateral aspect of the leg.  Mild tenderness in the popliteal fossa.  Leg is warm and well-perfused.  Skin:    General: Skin is warm and dry.  Neurological:     General: No focal deficit present.     Mental Status: He is alert and oriented to person, place, and time. Mental status is at baseline.  Psychiatric:        Mood and Affect: Mood normal.    ED Results / Procedures / Treatments    Labs (all labs ordered are listed, but only abnormal results are displayed) Labs Reviewed  CBC WITH DIFFERENTIAL/PLATELET - Abnormal; Notable for the following components:      Result Value   Hemoglobin 12.7 (*)    Platelets 141 (*)    All other components within normal limits  BASIC METABOLIC PANEL - Abnormal; Notable for the following components:   Glucose, Bld 209 (*)    All other components within normal  limits    EKG None  Radiology No results found.  Procedures Procedures   Medications Ordered in ED Medications  Rivaroxaban (XARELTO) tablet 15 mg (has no administration in time range)    ED Course  I have reviewed the triage vital signs and the nursing notes.  Pertinent labs & imaging results that were available during my care of the patient were reviewed by me and considered in my medical decision making (see chart for details).    MDM Rules/Calculators/A&P                           [name] presents with leg swelling.  He has a history of multiple venous thromboembolism's and requires long-term anticoagulation.  However, secondary to financial concerns, he has been unable to take these.  He presents with a likely DVT of the right leg.  Ultrasound is not available at this facility today.  He was given a dose of Xarelto.  He will be scheduled for an outpatient ultrasound.  He has been given information on cost saving programs for this medication.  I have also placed a social work consult in order to help provide medication assistance, and he was referred to [facility] for further assistance.  He was noted to be slightly hyperglycemic.  He will need follow-up for this issue as well. Final Clinical Impression(s) / ED Diagnoses Final diagnoses:  Leg swelling  History of venous thromboembolism  Hyperglycemia    Rx / DC Orders ED Discharge Orders          Ordered    US Venous Img Lower Unilateral Right        01/31/21 1947     RIVAROXABAN (XARELTO) VTE STARTER PACK (15 & 20 MG)        01/31/21 2032             Koleen Distance, MD 01/31/21 2035

## 2021-01-31 NOTE — ED Notes (Signed)
Pt verbalizes understanding of discharge instructions. Opportunity for questioning and answers were provided. Armand removed by staff, pt discharged from ED to home. Educated to pick up Rx with savings card.

## 2021-01-31 NOTE — ED Notes (Signed)
Pt given education and pharmacy cards for Xarelto. RN and patient wrote start day of 21 days of BID po med. Pt does not have insurance and gave the numbers for savings.

## 2021-02-01 ENCOUNTER — Ambulatory Visit (HOSPITAL_BASED_OUTPATIENT_CLINIC_OR_DEPARTMENT_OTHER)
Admission: RE | Admit: 2021-02-01 | Discharge: 2021-02-01 | Disposition: A | Discharge status: Home / Self Care | Payer: Self-pay | Source: Ambulatory Visit | Attending: Emergency Medicine | Admitting: Emergency Medicine

## 2021-02-01 DIAGNOSIS — I82411 Acute embolism and thrombosis of right femoral vein: Secondary | ICD-10-CM | POA: Insufficient documentation

## 2021-02-01 DIAGNOSIS — I82431 Acute embolism and thrombosis of right popliteal vein: Secondary | ICD-10-CM | POA: Insufficient documentation

## 2021-02-02 NOTE — ED Provider Notes (Addendum)
  Physical Exam  BP 114/73   Pulse 78   Temp 98.4 F (36.9 C) (Oral)   Resp 17   Ht 6\' 4"  (1.93 m)   Wt 120.2 kg   SpO2 98%   BMI 32.26 kg/m   Physical Exam  ED Course/Procedures     Procedures  MDM  Called to tell patient about results of ultrasound.  Has DVT from femoral and popliteal potentially more distally on the right side.  Already has had his anticoagulation filled. will follow-up with PCP.         , MD 02/02/21 02/04/21, MD 02/02/21 [phone]

## 2021-02-15 ENCOUNTER — Ambulatory Visit (HOSPITAL_COMMUNITY): Admission: EM | Admit: 2021-02-15 | Discharge: 2021-02-15 | Discharge status: Against Medical Advice | Payer: Self-pay

## 2021-02-15 ENCOUNTER — Other Ambulatory Visit: Payer: Self-pay

## 2021-02-17 ENCOUNTER — Ambulatory Visit (INDEPENDENT_AMBULATORY_CARE_PROVIDER_SITE_OTHER): Payer: Self-pay | Admitting: Student

## 2021-02-17 ENCOUNTER — Other Ambulatory Visit (HOSPITAL_COMMUNITY): Payer: Self-pay

## 2021-02-17 ENCOUNTER — Other Ambulatory Visit: Payer: Self-pay

## 2021-02-17 ENCOUNTER — Encounter: Payer: Self-pay | Admitting: Student

## 2021-02-17 VITALS — BP 125/69 | HR 69 | Temp 99.8°F | Resp 32 | Ht 76.0 in | Wt 298.4 lb

## 2021-02-17 DIAGNOSIS — E119 Type 2 diabetes mellitus without complications: Secondary | ICD-10-CM

## 2021-02-17 DIAGNOSIS — I82411 Acute embolism and thrombosis of right femoral vein: Secondary | ICD-10-CM

## 2021-02-17 LAB — POCT GLYCOSYLATED HEMOGLOBIN (HGB A1C): Hemoglobin A1C: 9.9 % — AB (ref 4.0–5.6)

## 2021-02-17 LAB — GLUCOSE, CAPILLARY: Glucose-Capillary: 154 mg/dL — ABNORMAL HIGH (ref 70–99)

## 2021-02-17 MED ORDER — METFORMIN HCL 1000 MG PO TABS
1000.0000 mg | ORAL_TABLET | Freq: Two times a day (BID) | ORAL | 11 refills | Status: DC
  Filled 2021-02-17: qty 30, 15d supply, fill #0

## 2021-02-17 MED ORDER — RIVAROXABAN 20 MG PO TABS
20.0000 mg | ORAL_TABLET | Freq: Every day | ORAL | 11 refills | Status: DC
  Filled 2021-02-17: qty 30, 30d supply, fill #0
  Filled 2021-03-26 – 2021-04-23 (×2): qty 30, 30d supply, fill #1

## 2021-02-17 MED ORDER — METFORMIN HCL 1000 MG PO TABS
1000.0000 mg | ORAL_TABLET | Freq: Two times a day (BID) | ORAL | 11 refills | Status: DC
  Filled 2021-02-17: qty 60, 30d supply, fill #0
  Filled 2021-03-26 – 2021-04-23 (×2): qty 60, 30d supply, fill #1
  Filled 2021-07-06: qty 60, 30d supply, fill #2

## 2021-02-17 MED ORDER — RIVAROXABAN (XARELTO) VTE STARTER PACK (15 & 20 MG)
20.0000 mg | ORAL_TABLET | Freq: Every day | ORAL | 11 refills | Status: DC
  Filled 2021-02-17: qty 30, fill #0

## 2021-02-17 NOTE — Patient Instructions (Signed)
It was a pleasure seeing you in clinic. Today we discussed:   Blood clots Please continue xarellto, I will send you medications to the outpatient pharmacy Please follow in 1-2 weeks if the swelling and pain in you leg gets worse for does not improve  Diabetes Please increase you metformin to 1000 mg twice a day We will check labs today and I will call you with the results Please schedule a follow up tele visit next week to discuss your diabetes medications   If you have any questions or concerns, please call our clinic at [phone] between 9am-5pm and after hours call [phone] and ask for the internal medicine resident on call. If you feel you are having a medical emergency please call 911.   Thank you, we look forward to helping you remain healthy!

## 2021-02-18 LAB — LIPID PANEL
Chol/HDL Ratio: 6.4 ratio — ABNORMAL HIGH (ref 0.0–5.0)
Cholesterol, Total: 153 mg/dL (ref 100–199)
HDL: 24 mg/dL — ABNORMAL LOW (ref >39)
LDL Chol Calc (NIH): 81 mg/dL (ref 0–99)
Triglycerides: 293 mg/dL — ABNORMAL HIGH (ref 0–149)
VLDL Cholesterol Cal: 48 mg/dL — ABNORMAL HIGH (ref 5–40)

## 2021-02-18 LAB — MICROALBUMIN / CREATININE URINE RATIO
Creatinine, Urine: 118.6 mg/dL
Microalb/Creat Ratio: 3 mg/g creat (ref 0–29)
Microalbumin, Urine: 3.4 ug/mL

## 2021-02-19 DIAGNOSIS — I82411 Acute embolism and thrombosis of right femoral vein: Secondary | ICD-10-CM | POA: Insufficient documentation

## 2021-02-19 DIAGNOSIS — E119 Type 2 diabetes mellitus without complications: Secondary | ICD-10-CM | POA: Insufficient documentation

## 2021-02-19 NOTE — Assessment & Plan Note (Addendum)
Recently diagnosed with diabetes given elevated sugars over 200 multiple times in recent ED visits in August.  Patient does note that he has been feeling more thirsty than usual.  He has not noticed weight loss, frequent urination or dysuria.  He was started on metformin 500 mg twice a day in the ED which she has been taking.  He denies any side effects or GI distress associated with this medication.  Plan hA1C Lipid profile Microalbumin creatinine ratio We will have patient make a telehealth visit in about 1 week as he may need more diabetes medications  Addendum A1c 9.9 may benefit from sglt2/glp-1 Microalbumin creatinine ratio within normal limits Lipid profile was normal total cholesterol and LDL ASCVD risk is 9.1% May benefit from moderate intensity statin Triglycerides are also high we will need to discuss lifestyle modifications  Called patient with the results he would like to make an [facility] visit next week to discuss additional medications diabetes further

## 2021-02-19 NOTE — Progress Notes (Signed)
   CC: Hospital follow-up of acute right-sided DVT, new diabetes  HPI:  Timothy [name] is a 50 y.o. male with past medical history as listed below presents for follow-up of right-sided DVT and diabetes.  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  HENT:     Head: Normocephalic and atraumatic.     Right Ear: External ear normal.     Left Ear: External ear normal.     Mouth/Throat:     Mouth: Mucous membranes are moist.     Pharynx: Oropharynx is clear.  Eyes:     Extraocular Movements: Extraocular movements intact.     Pupils: Pupils are equal, round, and reactive to light.  Cardiovascular:     Rate and Rhythm: Normal rate and regular rhythm.     Heart sounds: No murmur heard.   No friction rub. No gallop.  Pulmonary:     Effort: Pulmonary effort is normal. No respiratory distress.     Breath sounds: Normal breath sounds. No rhonchi.  Abdominal:     General: Abdomen is flat. Bowel sounds are normal.     Palpations: Abdomen is soft.  Musculoskeletal:     Cervical back: Normal range of motion and neck supple.     Comments: 1+ pitting edema of the right lower extremity, with pain to palpation of the right calf  Skin:    General: Skin is warm and dry.     Comments: Chronic venous stasis changes of the RLE   Neurological:     General: No focal deficit present.     Mental Status: He is alert and oriented to person, place, and time.    Assessment & Plan:   See Encounters Tab for problem based charting.  Patient discussed with Dr. 

## 2021-02-19 NOTE — Assessment & Plan Note (Signed)
Patient presents for follow-up of right leg pain.  He has a history of DVTs and PE and twice lifelong anticoagulation.  However he lost his insurance and has not been taking his Xarelto for the past 2 years.  He presented to [facility] ED with right posterior thigh pain.  Ultrasound study revealed DVT of the femoral popliteal veins.  He was subsequently restarted on his Xarelto and and has been taking it.  Notes that the pain in his right thigh has improved but now has migrated to the right calf with associated swelling.  He denies any new shortness of breath, cough, hemoptysis, chest pain.  On exam he appears hemodynamically stable.  Right calf appears larger than left with pitting edema.  Suspect that he may have developed further DVT in his right lower leg.  Patient is self-pay and should be able to do his Xarelto through the IM program.  Discussed this with the patient and sister who states that he would be able to afford $4 prescriptions.  Plan  continue Xarelto 20 mg daily

## 2021-02-26 NOTE — Progress Notes (Signed)
Internal Medicine Clinic Attending ? ?Case discussed with Dr. Liang  At the time of the visit.  We reviewed the resident?s history and exam and pertinent patient test results.  I agree with the assessment, diagnosis, and plan of care documented in the resident?s note. ? ?

## 2021-03-27 ENCOUNTER — Other Ambulatory Visit (HOSPITAL_COMMUNITY): Payer: Self-pay

## 2021-03-29 ENCOUNTER — Other Ambulatory Visit (HOSPITAL_COMMUNITY): Payer: Self-pay

## 2021-04-06 ENCOUNTER — Other Ambulatory Visit (HOSPITAL_COMMUNITY): Payer: Self-pay

## 2021-04-23 ENCOUNTER — Other Ambulatory Visit (HOSPITAL_COMMUNITY): Payer: Self-pay

## 2021-07-06 ENCOUNTER — Other Ambulatory Visit (HOSPITAL_COMMUNITY): Payer: Self-pay

## 2021-08-04 ENCOUNTER — Encounter (HOSPITAL_COMMUNITY): Payer: Self-pay | Admitting: *Deleted

## 2021-08-04 ENCOUNTER — Other Ambulatory Visit: Payer: Self-pay

## 2021-08-04 ENCOUNTER — Ambulatory Visit (HOSPITAL_COMMUNITY)
Admission: EM | Admit: 2021-08-04 | Discharge: 2021-08-04 | Disposition: A | Discharge status: Home / Self Care | Payer: Self-pay | Attending: Family Medicine | Admitting: Family Medicine

## 2021-08-04 DIAGNOSIS — B9689 Other specified bacterial agents as the cause of diseases classified elsewhere: Secondary | ICD-10-CM

## 2021-08-04 DIAGNOSIS — L089 Local infection of the skin and subcutaneous tissue, unspecified: Secondary | ICD-10-CM

## 2021-08-04 MED ORDER — MUPIROCIN 2 % EX OINT
TOPICAL_OINTMENT | CUTANEOUS | 0 refills | Status: DC
  Filled 2021-08-04: qty 22, 5d supply, fill #0

## 2021-08-04 MED ORDER — DOXYCYCLINE HYCLATE 100 MG PO CAPS
100.0000 mg | ORAL_CAPSULE | Freq: Two times a day (BID) | ORAL | 0 refills | Status: DC
  Filled 2021-08-04: qty 20, 10d supply, fill #0

## 2021-08-04 NOTE — ED Triage Notes (Signed)
T reports abscess in rt side of nose. ?

## 2021-08-05 ENCOUNTER — Other Ambulatory Visit (HOSPITAL_COMMUNITY): Payer: Self-pay

## 2021-08-05 NOTE — ED Provider Notes (Signed)
?[place] ? ? ?[id number] ?08/04/21 Arrival Time: 1855 ? ?ASSESSMENT & PLAN: ? ?1. Localized bacterial skin infection   ? ?No sign of abscess requiring I&D at this time. Begin antibiotic therapy. ? ?Meds ordered this encounter  ?Medications  ? doxycycline (VIBRAMYCIN) 100 MG capsule  ?  Sig: Take 1 capsule (100 mg total) by mouth 2 (two) times daily.  ?  Dispense:  20 capsule  ?  Refill:  0  ? mupirocin ointment (BACTROBAN) 2 %  ?  Sig: Apply to inner nose 2-3 times daily for 5 days.  ?  Dispense:  22 g  ?  Refill:  0  ? ?Will follow up with PCP or here if worsening or failing to improve as anticipated. ?Reviewed expectations re: course of current medical issues. Questions answered. ?Outlined signs and symptoms indicating need for more acute intervention. ?Patient verbalized understanding. ?After Visit Summary given. ? ? ?SUBJECTIVE: ? ?Timothy [name] is a 51 y.o. male who presents with a skin complaint. R naris; few days; increasing erythema with skin thickening. "Pulled a scab from inside my nose and stuff came out; felt a little better." Afebrile.  ? ? ?OBJECTIVE: ?Vitals:  ? 08/04/21 1920  ?BP: 139/82  ?Pulse: 70  ?Resp: 18  ?Temp: 98.2 ?F (36.8 ?C)  ?SpO2: 95%  ?  ?General appearance: alert; no distress ?HEENT: [place]; AT ?Neck: supple with FROM ?Extremities: no edema; moves all extremities normally ?Skin: warm and dry; R lateral naris with thickened slightly erythematous skin; TTP; no drainage or bleeding; no areas of fluctuance ?Psychological: alert and cooperative; normal mood and affect ? ?Allergies  ?Allergen Reactions  ? Beef-Derived Products Anaphylaxis  ?  Tick bite ?  ? Ioxaglate Shortness Of Breath and Other (See Comments)  ?  dizziness  ? Ivp Dye [Iodinated Contrast Media] Shortness Of Breath and Other (See Comments)  ?  dizziness  ? Pork-Derived Products Anaphylaxis  ?  From Tick bite  ? Shellfish Allergy   ? ? ?Past Medical History:  ?Diagnosis Date  ? Carpal tunnel syndrome on both sides  03/28/2013  ? Chronic headache   ? DVT (deep venous thrombosis) ([place])   ? 2017-2018: 4 dvts  ? GERD (gastroesophageal reflux disease)   ? Helicobacter pylori gastritis 06/30/06  ? 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 Pulmonary embolism ([place])   ? 2013: 2 total  ? TIA (transient ischemic attack)   ? ?Social History  ? ?Socioeconomic History  ? Marital status: Legally Separated  ?  Spouse name: Not on file  ? Number of children: 1  ? Years of education: Not on file  ? Highest education level: Not on file  ?Occupational History  ? Occupation: Full time student  ?Tobacco Use  ? Smoking status: Never  ? Smokeless tobacco: Former  ?  Quit date: 2013  ?Vaping Use  ? Vaping Use: Former  ?Substance and Sexual Activity  ? Alcohol use: No  ?  Alcohol/week: 0.0 standard drinks  ? Drug use: Not Currently  ?  Types: Other-see comments, Oxycodone, Heroin  ?  Comment: Former Use - 12/04/15  ? Sexual activity: Not Currently  ?  Partners: Female  ?Other Topics Concern  ? Not on file  ?Social History Narrative  ? Lives with wife and children in a one story home.  Has 2 children.    ? Works in Scientist, research (medical).    ? Education: college.  ? ?Social Determinants of Health  ? ?Financial Resource Strain: Not on  file  ?Food Insecurity: Not on file  ?Transportation Needs: Not on file  ?Physical Activity: Not on file  ?Stress: Not on file  ?Social Connections: Not on file  ?Intimate Partner Violence: Not on file  ? ?Family History  ?Problem Relation Age of Onset  ? Barrett's esophagus Mother   ? Breast cancer Mother   ? Irritable bowel syndrome Brother   ? Allergies Brother   ? Diabetes Brother   ? Heart disease Father   ? Heart attack Father   ? Diabetes Sister   ? Diabetes Paternal Grandfather   ? Heart disease Paternal Grandfather   ? Heart disease Paternal Grandmother   ? Heart disease Maternal Grandmother   ? Heart disease Maternal Grandfather   ? Colon cancer Neg Hx   ? Esophageal cancer Neg Hx   ? ?Past Surgical History:  ?Procedure Laterality  Date  ? KNEE SURGERY Right 10/2016  ? ? ?  ?Vanessa Kick, MD ?08/05/21 [phone] ? ?

## 2021-08-06 ENCOUNTER — Other Ambulatory Visit (HOSPITAL_COMMUNITY): Payer: Self-pay

## 2021-08-13 ENCOUNTER — Other Ambulatory Visit (HOSPITAL_COMMUNITY): Payer: Self-pay

## 2021-09-03 ENCOUNTER — Ambulatory Visit (HOSPITAL_COMMUNITY): Payer: Self-pay

## 2021-10-19 ENCOUNTER — Ambulatory Visit: Payer: Self-pay | Attending: Family Medicine | Admitting: Family Medicine

## 2021-10-19 ENCOUNTER — Other Ambulatory Visit: Payer: Self-pay

## 2021-10-19 VITALS — BP 138/92 | HR 84 | Ht 76.0 in | Wt 290.0 lb

## 2021-10-19 DIAGNOSIS — Z1159 Encounter for screening for other viral diseases: Secondary | ICD-10-CM

## 2021-10-19 DIAGNOSIS — E1169 Type 2 diabetes mellitus with other specified complication: Secondary | ICD-10-CM

## 2021-10-19 DIAGNOSIS — Z7984 Long term (current) use of oral hypoglycemic drugs: Secondary | ICD-10-CM | POA: Insufficient documentation

## 2021-10-19 DIAGNOSIS — Z91199 Patient's noncompliance with other medical treatment and regimen due to unspecified reason: Secondary | ICD-10-CM

## 2021-10-19 DIAGNOSIS — F329 Major depressive disorder, single episode, unspecified: Secondary | ICD-10-CM | POA: Insufficient documentation

## 2021-10-19 DIAGNOSIS — F1120 Opioid dependence, uncomplicated: Secondary | ICD-10-CM | POA: Insufficient documentation

## 2021-10-19 DIAGNOSIS — E1140 Type 2 diabetes mellitus with diabetic neuropathy, unspecified: Secondary | ICD-10-CM | POA: Insufficient documentation

## 2021-10-19 DIAGNOSIS — E119 Type 2 diabetes mellitus without complications: Secondary | ICD-10-CM | POA: Insufficient documentation

## 2021-10-19 DIAGNOSIS — I2699 Other pulmonary embolism without acute cor pulmonale: Secondary | ICD-10-CM

## 2021-10-19 DIAGNOSIS — Z91148 Patient's other noncompliance with medication regimen for other reason: Secondary | ICD-10-CM | POA: Insufficient documentation

## 2021-10-19 DIAGNOSIS — Z86718 Personal history of other venous thrombosis and embolism: Secondary | ICD-10-CM | POA: Insufficient documentation

## 2021-10-19 DIAGNOSIS — R030 Elevated blood-pressure reading, without diagnosis of hypertension: Secondary | ICD-10-CM

## 2021-10-19 DIAGNOSIS — E1142 Type 2 diabetes mellitus with diabetic polyneuropathy: Secondary | ICD-10-CM

## 2021-10-19 DIAGNOSIS — Z86711 Personal history of pulmonary embolism: Secondary | ICD-10-CM | POA: Insufficient documentation

## 2021-10-19 LAB — POCT GLYCOSYLATED HEMOGLOBIN (HGB A1C): HbA1c, POC (controlled diabetic range): 9.8 % — AB (ref 0.0–7.0)

## 2021-10-19 LAB — GLUCOSE, POCT (MANUAL RESULT ENTRY): POC Glucose: 324 mg/dl — AB (ref 70–99)

## 2021-10-19 MED ORDER — SERTRALINE HCL 50 MG PO TABS
50.0000 mg | ORAL_TABLET | Freq: Every day | ORAL | 6 refills | Status: DC
Start: 2021-10-19 — End: 2021-12-21
  Filled 2021-10-19: qty 90, 90d supply, fill #0

## 2021-10-19 MED ORDER — METFORMIN HCL 1000 MG PO TABS
1000.0000 mg | ORAL_TABLET | Freq: Two times a day (BID) | ORAL | 6 refills | Status: DC
  Filled 2021-10-19: qty 180, 90d supply, fill #0
  Filled 2022-03-20: qty 180, 90d supply, fill #1
  Filled 2022-06-19: qty 60, 30d supply, fill #2

## 2021-10-19 MED ORDER — TRUE METRIX METER W/DEVICE KIT
1.0000 | PACK | Freq: Three times a day (TID) | 0 refills | Status: DC
  Filled 2021-10-19: qty 1, 365d supply, fill #0

## 2021-10-19 MED ORDER — ATORVASTATIN CALCIUM 20 MG PO TABS
20.0000 mg | ORAL_TABLET | Freq: Every day | ORAL | 6 refills | Status: DC
Start: 2021-10-19 — End: 2022-10-01
  Filled 2021-10-19: qty 90, 90d supply, fill #0
  Filled 2022-03-20: qty 30, 30d supply, fill #1
  Filled 2022-04-17 – 2022-06-19 (×5): qty 30, 30d supply, fill #2
  Filled 2022-07-17 – 2022-07-27 (×3): qty 30, 30d supply, fill #3
  Filled 2022-08-28: qty 30, 30d supply, fill #4

## 2021-10-19 MED ORDER — DAPAGLIFLOZIN PROPANEDIOL 10 MG PO TABS
10.0000 mg | ORAL_TABLET | Freq: Every day | ORAL | 6 refills | Status: DC
  Filled 2021-10-19: qty 30, 30d supply, fill #0
  Filled 2021-11-25: qty 90, 90d supply, fill #1
  Filled 2022-03-20: qty 90, 90d supply, fill #2

## 2021-10-19 MED ORDER — TRUE METRIX BLOOD GLUCOSE TEST VI STRP
ORAL_STRIP | 12 refills | Status: DC
Start: 2021-10-19 — End: 2023-01-11
  Filled 2021-10-19: qty 100, 25d supply, fill #0
  Filled 2022-03-20: qty 100, 25d supply, fill #1
  Filled 2022-04-17 – 2022-06-19 (×4): qty 100, 25d supply, fill #2
  Filled 2022-07-13 – 2022-07-20 (×2): qty 100, 25d supply, fill #3
  Filled 2022-08-28: qty 100, 25d supply, fill #4
  Filled 2022-09-19 – 2022-10-01 (×2): qty 100, 25d supply, fill #5

## 2021-10-19 MED ORDER — TRUEPLUS LANCETS 28G MISC
1.0000 | Freq: Three times a day (TID) | 12 refills | Status: DC
  Filled 2021-10-19: qty 100, 33d supply, fill #0
  Filled 2022-03-20: qty 100, 33d supply, fill #1
  Filled 2022-04-17 – 2022-06-19 (×4): qty 100, 33d supply, fill #2
  Filled 2022-07-20: qty 100, 33d supply, fill #3
  Filled 2022-08-28: qty 100, 33d supply, fill #4
  Filled 2022-10-01: qty 100, 33d supply, fill #5

## 2021-10-19 MED ORDER — GABAPENTIN 300 MG PO CAPS
300.0000 mg | ORAL_CAPSULE | Freq: Two times a day (BID) | ORAL | 3 refills | Status: DC
Start: 2021-10-19 — End: 2021-12-21
  Filled 2021-10-19: qty 60, 30d supply, fill #0
  Filled 2021-11-18: qty 60, 30d supply, fill #1
  Filled 2021-12-16: qty 60, 30d supply, fill #2

## 2021-10-19 MED ORDER — RIVAROXABAN 20 MG PO TABS
20.0000 mg | ORAL_TABLET | Freq: Every day | ORAL | 6 refills | Status: DC
  Filled 2021-10-19: qty 30, 30d supply, fill #0
  Filled 2021-11-18: qty 30, 30d supply, fill #1
  Filled 2021-12-25: qty 30, 30d supply, fill #2
  Filled 2022-02-02: qty 30, 30d supply, fill #3
  Filled 2022-03-20: qty 30, 30d supply, fill #4
  Filled 2022-04-17 – 2022-06-19 (×4): qty 30, 30d supply, fill #5
  Filled 2022-07-17 – 2022-08-03 (×2): qty 30, 30d supply, fill #6

## 2021-10-19 NOTE — Progress Notes (Signed)
? ?Subjective:  ?Patient ID: Timothy [name], male    DOB: [birth year]  Age: 51 y.o. MRN: [id number] ? ?CC: No chief complaint on file. ? ? ?HPI ?Timothy [name] is a 51 y.o. year old male with a history of type 2 diabetes mellitus (A1c 9.8), history of PE, history of DVT (in 01/2021), depression chronic methadone therapy (for heroine addiction) here to establish care. ?Diagnosed  with Type 2 DM in 2022. ?[facility] until the fall of last year and since then has not been to see a PCP. ? ?Interval History: ?Has not been compliant with his Metformin and has not been checking his blood sugars. ?He has neuropathy in his feet and has to wear his shoes for several hours during the day to prevent this. ?He has Depression and has not been motivated to take his metformin. ?Took Zoloft in the past for his depression.  Denies suicidal ideations or intent. 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 ? ?He should be on chronic anticoagulation with Xarelto but he has been nonadherent with it due to lack of PCP.  He is willing to get back on it ?He is accompanied to today's visit by his brother. ?Past Medical History:  ?Diagnosis Date  ? Carpal tunnel syndrome on both sides 03/28/2013  ? Chronic headache   ? DVT (deep venous thrombosis) (HCC)   ? 2017-2018: 4 dvts  ? GERD (gastroesophageal reflux disease)   ? Helicobacter pylori gastritis 06/30/06  ? 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 Pulmonary embolism (HCC)   ? 2013: 2 total  ? TIA (transient ischemic attack)   ? ? ?Past Surgical History:  ?Procedure Laterality Date  ? KNEE SURGERY Right 10/2016  ? ? ?Family History  ?Problem Relation Age of Onset  ? Barrett's esophagus Mother   ? Breast cancer Mother   ? Irritable bowel syndrome Brother   ? Allergies Brother   ? Diabetes Brother   ? Heart disease Father   ? Heart attack Father   ? Diabetes Sister   ? Diabetes Paternal Grandfather   ?  Heart disease Paternal Grandfather   ? Heart disease Paternal Grandmother   ? Heart disease Maternal Grandmother   ? Heart disease Maternal Grandfather   ? Colon cancer Neg Hx   ? Esophageal cancer Neg Hx   ? ? ?Social History  ? ?Socioeconomic History  ? Marital status: Legally Separated  ?  Spouse name: Not on file  ? Number of children: 1  ? Years of education: Not on file  ? Highest education level: Not on file  ?Occupational History  ? Occupation: Full time student  ?Tobacco Use  ? Smoking status: Never  ? Smokeless tobacco: Former  ?  Quit date: 2013  ?Vaping Use  ? Vaping Use: Former  ?Substance and Sexual Activity  ? Alcohol use: No  ?  Alcohol/week: 0.0 standard drinks  ? Drug use: Not Currently  ?  Types: Other-see comments, Oxycodone, Heroin  ?  Comment: Former Use - 12/04/15  ? Sexual activity: Not Currently  ?  Partners: Female  ?Other Topics Concern  ? Not on file  ?Social History Narrative  ? Lives with wife and children in a one story home.  Has 2 children.    ? 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    ? Education: college.  ? ?Social Determinants of Health  ? ?Financial Resource Strain: Not  on file  ?Food Insecurity: Not on file  ?Transportation Needs: Not on file  ?Physical Activity: Not on file  ?Stress: Not on file  ?Social Connections: Not on file  ? ? ?Allergies  ?Allergen Reactions  ? Beef-Derived Products Anaphylaxis  ?  Tick bite ?  ? Ioxaglate Shortness Of Breath and Other (See Comments)  ?  dizziness  ? Ivp Dye [Iodinated Contrast Media] Shortness Of Breath and Other (See Comments)  ?  dizziness  ? Pork-Derived Products Anaphylaxis  ?  From Tick bite  ? Shellfish Allergy   ? ? ?Outpatient Medications Prior to Visit  ?Medication Sig Dispense Refill  ? methadone (DOLOPHINE) 10 MG/ML solution Take 45 mg by mouth daily.     ? metFORMIN (GLUCOPHAGE) 1000 MG tablet Take 1 tablet (1,000 mg total) by mouth 2 (two) times daily with a meal. 60 tablet 11  ? rivaroxaban (XARELTO) 20 MG TABS tablet Take 1 tablet (20 mg  total) by mouth daily with supper. 30 tablet 11  ? cetirizine (ZYRTEC) 10 MG tablet Take 1 tablet (10 mg total) by mouth daily. (Patient not taking: Reported on 10/19/2021) 30 tablet 5  ? doxycycline (VIBRAMYCIN) 100 MG capsule Take 1 capsule (100 mg total) by mouth 2 (two) times daily. (Patient not taking: Reported on 10/19/2021) 20 capsule 0  ? EPINEPHrine (EPIPEN 2-PAK) 0.3 mg/0.3 mL IJ SOAJ injection Inject 0.3 mLs (0.3 mg total) into the muscle once as needed for up to 1 dose (for severe allergic reaction). CAll 911 immediately if you have to use this medicine (Patient not taking: Reported on 10/19/2021) 2 each 0  ? mupirocin ointment (BACTROBAN) 2 % Apply to inner nose 2-3 times daily for 5 days. (Patient not taking: Reported on 10/19/2021) 22 g 0  ? ketorolac (TORADOL) 10 MG tablet Take 1 tablet (10 mg total) by mouth every 6 (six) hours as needed. (Patient not taking: Reported on 10/19/2021) 20 tablet 0  ? ?No facility-administered medications prior to visit.  ? ? ? ?ROS ?Review of Systems  ?Constitutional:  Negative for activity change and appetite change.  ?HENT:  Negative for sinus pressure and sore throat.   ?Eyes:  Negative for visual disturbance.  ?Respiratory:  Negative for cough, chest tightness and shortness of breath.   ?Cardiovascular:  Negative for chest pain and leg swelling.  ?Gastrointestinal:  Negative for abdominal distention, abdominal pain, constipation and diarrhea.  ?Endocrine: Negative.   ?Genitourinary:  Negative for dysuria.  ?Musculoskeletal:  Negative for joint swelling and myalgias.  ?Skin:  Negative for rash.  ?Allergic/Immunologic: Negative.   ?Neurological:  Negative for weakness, light-headedness and numbness.  ?Psychiatric/Behavioral:  Positive for dysphoric mood. Negative for suicidal ideas.   ? ?Objective:  ?BP (!) 138/92   Pulse 84   Ht 6\' 4"  (1.93 m)   Wt 290 lb (131.5 kg)   SpO2 96%   BMI 35.30 kg/m?  ? ? ?  10/19/2021  ?  1:59 PM 08/04/2021  ?  7:20 PM 02/17/2021  ? 11:12 AM   ?BP/Weight  ?Systolic BP 138 139 125  ?Diastolic BP 92 82 69  ?Wt. (Lbs) 290    ?BMI 35.3 kg/m2    ? ? ? ? ?Physical Exam ?Constitutional:   ?   Appearance: He is well-developed.  ?Cardiovascular:  ?   Rate and Rhythm: Normal rate.  ?   Heart sounds: Normal heart sounds. No murmur heard. ?Pulmonary:  ?   Effort: Pulmonary effort is normal.  ?  Breath sounds: Normal breath sounds. No wheezing or rales.  ?Chest:  ?   Chest wall: No tenderness.  ?Abdominal:  ?   General: Bowel sounds are normal. There is no distension.  ?   Palpations: Abdomen is soft. There is no mass.  ?   Tenderness: There is no abdominal tenderness.  ?Musculoskeletal:     ?   General: Normal range of motion.  ?   Right lower leg: No edema.  ?   Left lower leg: No edema.  ?Neurological:  ?   Mental Status: He is alert and oriented to person, place, and time.  ?Psychiatric:  ?   Comments: Dysphoric mood  ? ? ? ?  Latest Ref Rng & Units 01/31/2021  ?  7:53 PM 01/22/2021  ?  3:07 PM 12/03/2018  ?  1:30 AM  ?CMP  ?Glucose 70 - 99 mg/dL 416   606   301    ?BUN 6 - 20 mg/dL 16   17   16     ?Creatinine 0.61 - 1.24 mg/dL   6.01   0.93    ?Sodium 135 - 145 mmol/L 139   137    ? 137   141    ?Potassium 3.5 - 5.1 mmol/L 4.2   4.5    ? 4.4   4.5    ?Chloride 98 - 111 mmol/L 104   100   108    ?CO2 22 - 32 mmol/L 25   29   22     ?Calcium 8.9 - 10.3 mg/dL 9.4   9.3   9.3    ? ? ?Lipid Panel  ?   ?Component Value Date/Time  ? CHOL 153 02/17/2021 1130  ? TRIG 293 (H) 02/17/2021 1130  ? HDL 24 (L) 02/17/2021 1130  ? CHOLHDL 6.4 (H) 02/17/2021 1130  ? LDLCALC 81 02/17/2021 1130  ? ? ?CBC ?   ?Component Value Date/Time  ? WBC 7.7 01/31/2021 1953  ? RBC 4.30 01/31/2021 1953  ? HGB 12.7 (L) 01/31/2021 1953  ? HGB 14.9 06/04/2015 0955  ? HCT 39.1 01/31/2021 1953  ? HCT 42.5 06/04/2015 0955  ? PLT 141 (L) 01/31/2021 1953  ? PLT 176 06/04/2015 0955  ? MCV 90.9 01/31/2021 1953  ? MCV 92 06/04/2015 0955  ? MCH 29.5 01/31/2021 1953  ? MCHC 32.5 01/31/2021 1953  ? RDW 14.7  01/31/2021 1953  ? RDW 12.8 06/04/2015 0955  ? LYMPHSABS 2.2 01/31/2021 1953  ? LYMPHSABS 1.7 06/04/2015 0955  ? MONOABS 0.6 01/31/2021 1953  ? EOSABS 0.2 01/31/2021 1953  ? EOSABS 0.3 06/04/2015 0955  ? BASO

## 2021-10-19 NOTE — Patient Instructions (Signed)
Diabetic Neuropathy ?Diabetic neuropathy refers to nerve damage that is caused by diabetes. Over time, people with diabetes can develop nerve damage throughout the body. There are several types of diabetic neuropathy: ?Peripheral neuropathy. This is the most common type of diabetic neuropathy. It damages the nerves that carry signals between the spinal cord and other parts of the body (peripheral nerves). This usually affects nerves in the feet, legs, hands, and arms. ?Autonomic neuropathy. This type causes damage to nerves that control involuntary functions (autonomic nerves). Involuntary functions are functions of the body that you do not control. They include heartbeat, body temperature, blood pressure, urination, digestion, sweating, sexual function, or response to changes in blood glucose. ?Focal neuropathy. This type of nerve damage affects one area of the body, such as an arm, a leg, or the face. The injury may involve one nerve or a small group of nerves. Focal neuropathy can be painful and unpredictable. It occurs most often in older adults with diabetes. This often develops suddenly, but usually improves over time and does not cause long-term problems. ?Proximal neuropathy. This type of nerve damage affects the nerves of the thighs, hips, buttocks, or legs. It causes severe pain, weakness, and muscle death (atrophy), usually in the thigh muscles. It is more common among older men and people who have type 2 diabetes. The length of recovery time may vary. ?What are the causes? ?Peripheral, autonomic, and focal neuropathies are caused by diabetes that is not well controlled with treatment. The cause of proximal neuropathy is not known, but it may be caused by inflammation related to uncontrolled blood glucose levels. ?What are the signs or symptoms? ?Peripheral neuropathy ?Peripheral neuropathy develops slowly over time. When the nerves of the feet and legs no longer work, you may experience: ?Burning,  stabbing, or aching pain in the legs or feet. ?Pain or cramping in the legs or feet. ?Loss of feeling (numbness) and inability to feel pressure or pain in the feet. This can lead to: ?Thick calluses or sores on areas of constant pressure. ?Ulcers. ?Reduced ability to feel temperature changes. ?Foot deformities. ?Muscle weakness. ?Loss of balance or coordination. ?Autonomic neuropathy ?The symptoms of autonomic neuropathy vary depending on which nerves are affected. Symptoms may include: ?Problems with digestion, such as: ?Nausea or vomiting. ?Poor appetite. ?Bloating. ?Diarrhea or constipation. ?Trouble swallowing. ?Losing weight without trying to. ?Problems with the heart, blood, and lungs, such as: ?Dizziness, especially when standing up. ?Fainting. ?Shortness of breath. ?Irregular heartbeat. ?Bladder problems, such as: ?Trouble starting or stopping urination. ?Leaking urine. ?Trouble emptying the bladder. ?Urinary tract infections (UTIs). ?Problems with other body functions, such as: ?Sweat. You may sweat too much or too little. ?Temperature. You might get hot easily. Or, you might feel cold more than usual. ?Sexual function. Men may not be able to get or maintain an erection. Women may have vaginal dryness and difficulty with arousal. ?Focal neuropathy ?Symptoms affect only one area of the body. Common symptoms include: ?Numbness. ?Tingling. ?Burning pain. ?Prickling feeling. ?Very sensitive skin. ?Weakness. ?Inability to move (paralysis). ?Muscle twitching. ?Muscles getting smaller (wasting). ?Poor coordination. ?Double or blurred vision. ?Proximal neuropathy ?Sudden, severe pain in the hip, thigh, or buttocks. Pain may spread from the back into the legs (sciatica). ?Pain and numbness in the arms and legs. ?Tingling. ?Loss of bladder control or bowel control. ?Weakness and wasting of thigh muscles. ?Difficulty getting up from a seated position. ?Abdominal swelling. ?Unexplained weight loss. ?How is this  diagnosed? ?Diagnosis varies depending on the type   of neuropathy your health care provider suspects. ?Peripheral neuropathy ?Your health care provider will do a neurologic exam. This exam checks your reflexes, how you move, and what you can feel. You may have other tests, such as: ?Blood tests. ?Tests of the fluid that surrounds the spinal cord (lumbar puncture). ?CT scan. ?MRI. ?Checking the nerves that control muscles (electromyogram, or EMG). ?Checking how quickly signals pass through your nerves (nerve conduction study). ?Checking a small piece of a nerve using a microscope (biopsy). ?Autonomic neuropathy ?You may have tests, such as: ?Tests to measure your blood pressure and heart rate. You may be secured to an exam table that moves you from a lying position to an upright position (table tilt test). ?Breathing tests to check your lungs. ?Tests to check how food moves through the digestive system (gastric emptying tests). ?Blood, sweat, or urine tests. ?Ultrasound of your bladder. ?Spinal fluid tests. ?Focal neuropathy ?This condition may be diagnosed with: ?A neurologic exam. ?CT scan. ?MRI. ?EMG. ?Nerve conduction study. ?Proximal neuropathy ?There is no test to diagnose this type of neuropathy. You may have tests to rule out other possible causes of this type of neuropathy. Tests may include: ?X-rays of your spine and lumbar region. ?Lumbar puncture. ?MRI. ?How is this treated? ?The goal of treatment is to keep nerve damage from getting worse. Treatment may include: ?Following your diabetes management plan. This will help keep your blood glucose level and your A1C level within your target range. This is the most important treatment. ?Using prescription pain medicine. ?Follow these instructions at home: ?Diabetes management ?Follow your diabetes management plan as told by your health care provider. ?Check your blood glucose levels. ?Keep your blood glucose in your target range. ?Have your A1C level checked at  least two times a year, or as often as told. ?Take over the counter and prescription medicines only as told by your health care provider. This includes insulin and diabetes medicine. ? ?Lifestyle ? ?Do not use any products that contain nicotine or tobacco, such as cigarettes, e-cigarettes, and chewing tobacco. If you need help quitting, ask your health care provider. ?Be physically active every day. Include strength training and balance exercises. ?Follow a healthy meal plan. ?Work with your health care provider to manage your blood pressure. ?General instructions ?Ask your health care provider if the medicine prescribed to you requires you to avoid driving or using machinery. ?Check your skin and feet every day for cuts, bruises, redness, blisters, or sores. ?Keep all follow-up visits. This is important. ?Contact a health care provider if: ?You have burning, stabbing, or aching pain in your legs or feet. ?You are unable to feel pressure or pain in your feet. ?You develop problems with digestion, such as: ?Nausea. ?Vomiting. ?Bloating. ?Constipation. ?Diarrhea. ?Abdominal pain. ?You have difficulty with urination, such as: ?Inability to control when you urinate (incontinence). ?Inability to completely empty the bladder (retention). ?You feel as if your heart is racing (palpitations). ?You feel dizzy, weak, or faint when you stand up. ?Get help right away if: ?You cannot urinate. ?You have sudden weakness or loss of coordination. ?You have trouble speaking. ?You have pain or pressure in your chest. ?You have an irregular heartbeat. ?You have sudden inability to move a part of your body. ?These symptoms may represent a serious problem that is an emergency. Do not wait to see if the symptoms will go away. Get medical help right away. Call your local emergency services (911 in the [place] Do not drive yourself to   the hospital. ?Summary ?Diabetic neuropathy is nerve damage that is caused by diabetes. It can cause numbness  and pain in the arms, legs, digestive tract, heart, and other body systems. ?This condition is treated by keeping your blood glucose level and your A1C level within your target range. This can help prevent neurop

## 2021-10-19 NOTE — Progress Notes (Signed)
Depression ?Neuropathy. ?

## 2021-10-20 ENCOUNTER — Encounter: Payer: Self-pay | Admitting: Family Medicine

## 2021-10-20 LAB — CMP14+EGFR
ALT: 29 IU/L (ref 0–44)
AST: 30 IU/L (ref 0–40)
Albumin/Globulin Ratio: 1.6 (ref 1.2–2.2)
Albumin: 4.5 g/dL (ref 4.0–5.0)
Alkaline Phosphatase: 117 IU/L (ref 44–121)
BUN/Creatinine Ratio: 15 (ref 9–20)
BUN: 12 mg/dL (ref 6–24)
Bilirubin Total: <0.2 mg/dL (ref 0.0–1.2)
CO2: 21 mmol/L (ref 20–29)
Calcium: 9.5 mg/dL (ref 8.7–10.2)
Chloride: 102 mmol/L (ref 96–106)
Creatinine, Ser: 0.82 mg/dL (ref 0.76–1.27)
Globulin, Total: 2.9 g/dL (ref 1.5–4.5)
Glucose: 339 mg/dL — ABNORMAL HIGH (ref 70–99)
Potassium: 4.6 mmol/L (ref 3.5–5.2)
Sodium: 141 mmol/L (ref 134–144)
Total Protein: 7.4 g/dL (ref 6.0–8.5)
eGFR: 107 mL/min/{1.73_m2} (ref >59)

## 2021-10-20 LAB — HCV INTERPRETATION

## 2021-10-20 LAB — HCV AB W REFLEX TO QUANT PCR: HCV Ab: NONREACTIVE

## 2021-10-22 ENCOUNTER — Other Ambulatory Visit: Payer: Self-pay

## 2021-10-31 ENCOUNTER — Other Ambulatory Visit: Payer: Self-pay | Admitting: Family Medicine

## 2021-10-31 ENCOUNTER — Encounter: Payer: Self-pay | Admitting: Family Medicine

## 2021-10-31 MED ORDER — OMEPRAZOLE 40 MG PO CPDR
40.0000 mg | DELAYED_RELEASE_CAPSULE | Freq: Every day | ORAL | 1 refills | Status: DC
  Filled 2021-10-31: qty 30, 30d supply, fill #0
  Filled 2021-12-16: qty 30, 30d supply, fill #1

## 2021-11-02 ENCOUNTER — Other Ambulatory Visit: Payer: Self-pay

## 2021-11-15 ENCOUNTER — Other Ambulatory Visit: Payer: Self-pay

## 2021-11-17 ENCOUNTER — Encounter: Payer: Self-pay | Admitting: Family Medicine

## 2021-11-18 ENCOUNTER — Other Ambulatory Visit: Payer: Self-pay

## 2021-11-25 ENCOUNTER — Other Ambulatory Visit: Payer: Self-pay

## 2021-11-26 ENCOUNTER — Other Ambulatory Visit: Payer: Self-pay

## 2021-12-03 ENCOUNTER — Ambulatory Visit (HOSPITAL_COMMUNITY): Payer: Self-pay

## 2021-12-17 ENCOUNTER — Other Ambulatory Visit: Payer: Self-pay

## 2021-12-21 ENCOUNTER — Encounter: Payer: Self-pay | Admitting: Family Medicine

## 2021-12-21 ENCOUNTER — Ambulatory Visit: Payer: Medicaid Other | Attending: Family Medicine | Admitting: Family Medicine

## 2021-12-21 ENCOUNTER — Other Ambulatory Visit: Payer: Self-pay

## 2021-12-21 VITALS — BP 145/94 | HR 102 | Ht 76.0 in | Wt 281.0 lb

## 2021-12-21 DIAGNOSIS — E1169 Type 2 diabetes mellitus with other specified complication: Secondary | ICD-10-CM

## 2021-12-21 DIAGNOSIS — E1142 Type 2 diabetes mellitus with diabetic polyneuropathy: Secondary | ICD-10-CM

## 2021-12-21 DIAGNOSIS — Z6834 Body mass index (BMI) 34.0-34.9, adult: Secondary | ICD-10-CM

## 2021-12-21 DIAGNOSIS — I152 Hypertension secondary to endocrine disorders: Secondary | ICD-10-CM

## 2021-12-21 DIAGNOSIS — F329 Major depressive disorder, single episode, unspecified: Secondary | ICD-10-CM

## 2021-12-21 DIAGNOSIS — E1159 Type 2 diabetes mellitus with other circulatory complications: Secondary | ICD-10-CM

## 2021-12-21 LAB — GLUCOSE, POCT (MANUAL RESULT ENTRY): POC Glucose: 338 mg/dl — AB (ref 70–99)

## 2021-12-21 MED ORDER — AMLODIPINE BESYLATE 2.5 MG PO TABS
2.5000 mg | ORAL_TABLET | Freq: Every day | ORAL | 1 refills | Status: DC
  Filled 2021-12-21: qty 30, 30d supply, fill #0
  Filled 2022-03-20: qty 30, 30d supply, fill #1
  Filled 2022-04-17 – 2022-06-19 (×4): qty 30, 30d supply, fill #2
  Filled 2022-07-17 – 2022-07-27 (×3): qty 30, 30d supply, fill #3
  Filled 2022-08-28: qty 30, 30d supply, fill #4
  Filled 2022-10-01: qty 30, 30d supply, fill #5

## 2021-12-21 MED ORDER — SERTRALINE HCL 100 MG PO TABS
100.0000 mg | ORAL_TABLET | Freq: Every day | ORAL | 1 refills | Status: DC
  Filled 2021-12-21: qty 90, 90d supply, fill #0
  Filled 2022-03-20: qty 30, 30d supply, fill #1

## 2021-12-21 MED ORDER — OZEMPIC (0.25 OR 0.5 MG/DOSE) 2 MG/3ML ~~LOC~~ SOPN
0.2500 mg | PEN_INJECTOR | SUBCUTANEOUS | 6 refills | Status: DC
  Filled 2021-12-21: qty 2, 70d supply, fill #0
  Filled 2022-01-27: qty 3, 56d supply, fill #0
  Filled 2022-03-21: qty 3, 56d supply, fill #1
  Filled 2022-05-06 – 2022-06-19 (×2): qty 3, 56d supply, fill #2
  Filled 2022-08-28: qty 3, 56d supply, fill #3

## 2021-12-21 MED ORDER — GABAPENTIN 300 MG PO CAPS
600.0000 mg | ORAL_CAPSULE | Freq: Two times a day (BID) | ORAL | 6 refills | Status: DC
  Filled 2021-12-21 – 2021-12-30 (×4): qty 120, 30d supply, fill #0
  Filled 2022-02-02: qty 120, 30d supply, fill #1
  Filled 2022-03-04: qty 120, 30d supply, fill #2

## 2021-12-21 NOTE — Progress Notes (Signed)
Subjective:  Patient ID: Timothy [name], male    DOB: 04-Feb-1971  Age: 51 y.o. MRN: [id number]  CC: Diabetes   HPI Timothy [name] is a 51 y.o. year old male with a history of type 2 diabetes mellitus (A1c 9.8), history of PE, history of DVT (in 01/2021), depression chronic methadone therapy (for heroine addiction)  Interval History: He complains Zoloft is not working as his depression  and anxiety are uncontrolled. He states 'the anxiety level in his house is high' as he cares for his elderly parents which are his major stressors.  States he is needing to take more gabapentin due to pain in his feet and he has been taking a total of 1200 mg/day Blood sugar readings average about 150-200 daily and he endorses adherence with his medications Accompanied by his brother to today's visit.  Past Medical History:  Diagnosis Date   Carpal tunnel syndrome on both sides 03/28/2013   Chronic headache    DVT (deep venous thrombosis) ([place])    2017-2018: 4 dvts   GERD (gastroesophageal reflux disease)    Helicobacter pylori gastritis 06/30/06   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   Pulmonary embolism ([place])    2013: 2 total   TIA (transient ischemic attack)     Past Surgical History:  Procedure Laterality Date   KNEE SURGERY Right 10/2016    Family History  Problem Relation Age of Onset   Barrett's esophagus Mother    Breast cancer Mother    Irritable bowel syndrome Brother    Allergies Brother    Diabetes Brother    Heart disease Father    Heart attack Father    Diabetes Sister    Diabetes Paternal Grandfather    Heart disease Paternal Grandfather    Heart disease Paternal Grandmother    Heart disease Maternal Grandmother    Heart disease Maternal Grandfather    Colon cancer Neg Hx    Esophageal cancer Neg Hx     Social History   Socioeconomic History   Marital status: Legally Separated    Spouse name: Not on file   Number of children: 1   Years of education: Not on  file   Highest education level: Not on file  Occupational History   Occupation: Full time student  Tobacco Use   Smoking status: Never   Smokeless tobacco: Former    Quit date: 2013  Vaping Use   Vaping Use: Former  Substance and Sexual Activity   Alcohol use: No    Alcohol/week: 0.0 standard drinks of alcohol   Drug use: Not Currently    Types: Other-see comments, Oxycodone, Heroin    Comment: Former Use - 12/04/15   Sexual activity: Not Currently    Partners: Female  Other Topics Concern   Not on file  Social History Narrative   Lives with wife and children in a one story home.  Has 2 children.     Works in Scientist, research (medical).     Education: college.   Social Determinants of Health   Financial Resource Strain: Not on file  Food Insecurity: Not on file  Transportation Needs: Not on file  Physical Activity: Not on file  Stress: Not on file  Social Connections: Not on file    Allergies  Allergen Reactions   Beef-Derived Products Anaphylaxis    Tick bite    Ioxaglate Shortness Of Breath and Other (See Comments)    dizziness   Ivp Dye [Iodinated Contrast Media] Shortness Of Breath and  Other (See Comments)    dizziness   Pork-Derived Products Anaphylaxis    From Tick bite   Shellfish Allergy     Outpatient Medications Prior to Visit  Medication Sig Dispense Refill   atorvastatin (LIPITOR) 20 MG tablet Take 1 tablet (20 mg total) by mouth daily. 30 tablet 6   Blood Glucose Monitoring Suppl (TRUE METRIX METER) w/Device KIT use to test 3 times daily before meals 1 kit 0   dapagliflozin propanediol (FARXIGA) 10 MG TABS tablet Take 1 tablet (10 mg total) by mouth daily before breakfast. 30 tablet 6   EPINEPHrine (EPIPEN 2-PAK) 0.3 mg/0.3 mL IJ SOAJ injection Inject 0.3 mLs (0.3 mg total) into the muscle once as needed for up to 1 dose (for severe allergic reaction). CAll 911 immediately if you have to use this medicine 2 each 0   glucose blood (TRUE METRIX BLOOD GLUCOSE TEST) test  strip Use 3 times daily 100 each 12   metFORMIN (GLUCOPHAGE) 1000 MG tablet Take 1 tablet (1,000 mg total) by mouth 2 (two) times daily with a meal. 60 tablet 6   methadone (DOLOPHINE) 10 MG/ML solution Take 45 mg by mouth daily.      omeprazole (PRILOSEC) 40 MG capsule Take 1 capsule (40 mg total) by mouth daily. 30 capsule 1   rivaroxaban (XARELTO) 20 MG TABS tablet Take 1 tablet (20 mg total) by mouth daily with supper. 30 tablet 6   TRUEplus Lancets 28G MISC use to test blood sugar 3 (three) times daily before meals. 100 each 12   gabapentin (NEURONTIN) 300 MG capsule Take 1 capsule (300 mg total) by mouth 2 (two) times daily. 60 capsule 3   sertraline (ZOLOFT) 50 MG tablet Take 1 tablet (50 mg total) by mouth daily. 30 tablet 6   doxycycline (VIBRAMYCIN) 100 MG capsule Take 1 capsule (100 mg total) by mouth 2 (two) times daily. 20 capsule 0   mupirocin ointment (BACTROBAN) 2 % Apply to inner nose 2-3 times daily for 5 days. 22 g 0   cetirizine (ZYRTEC) 10 MG tablet Take 1 tablet (10 mg total) by mouth daily. (Patient not taking: Reported on 10/19/2021) 30 tablet 5   No facility-administered medications prior to visit.     ROS Review of Systems  Constitutional:  Negative for activity change and appetite change.  HENT:  Negative for sinus pressure and sore throat.   Respiratory:  Negative for chest tightness, shortness of breath and wheezing.   Cardiovascular:  Negative for chest pain and palpitations.  Gastrointestinal:  Negative for abdominal distention, abdominal pain and constipation.  Genitourinary: Negative.   Musculoskeletal: Negative.   Neurological:  Positive for numbness.  Psychiatric/Behavioral:  Negative for behavioral problems and dysphoric mood.     Objective:  BP (!) 145/94   Pulse (!) 102   Ht _0  (1.93 m)   Wt 281 lb (127.5 kg)   SpO2 94%   BMI 34.20 kg/m      12/21/2021    2:23 PM 10/19/2021    1:59 PM 08/04/2021    7:20 PM  BP/Weight  Systolic BP 536 144  315  Diastolic BP 94 92 82  Wt. (Lbs) 281 290   BMI 34.2 kg/m2 35.3 kg/m2       Physical Exam Constitutional:      Appearance: He is well-developed. He is obese.  Cardiovascular:     Rate and Rhythm: Normal rate.     Heart sounds: Normal heart sounds. No murmur heard. Pulmonary:  Effort: Pulmonary effort is normal.     Breath sounds: Normal breath sounds. No wheezing or rales.  Chest:     Chest wall: No tenderness.  Abdominal:     General: Bowel sounds are normal. There is no distension.     Palpations: Abdomen is soft. There is no mass.     Tenderness: There is no abdominal tenderness.  Musculoskeletal:        General: Normal range of motion.     Right lower leg: No edema.     Left lower leg: No edema.  Neurological:     Mental Status: He is alert and oriented to person, place, and time.  Psychiatric:        Mood and Affect: Mood normal.        Latest Ref Rng & Units 10/19/2021    2:37 PM 01/31/2021    7:53 PM 01/22/2021    3:07 PM  CMP  Glucose 70 - 99 mg/dL 339  209  355   BUN 6 - 24 mg/dL _0 Creatinine 0.76 - 1.27 mg/dL 0.82  0.69  0.88   Sodium 134 - 144 mmol/L 141  139  137    137   Potassium 3.5 - 5.2 mmol/L 4.6  4.2  4.5    4.4   Chloride 96 - 106 mmol/L 102  104  100   CO2 20 - 29 mmol/L _1 Calcium 8.7 - 10.2 mg/dL 9.5  9.4  9.3   Total Protein 6.0 - 8.5 g/dL 7.4     Total Bilirubin 0.0 - 1.2 mg/dL <0.2     Alkaline Phos 44 - 121 IU/L 117     AST 0 - 40 IU/L 30     ALT 0 - 44 IU/L 29       Lipid Panel     Component Value Date/Time   CHOL 153 02/17/2021 1130   TRIG 293 (H) 02/17/2021 1130   HDL 24 (L) 02/17/2021 1130   CHOLHDL 6.4 (H) 02/17/2021 1130   LDLCALC 81 02/17/2021 1130    CBC    Component Value Date/Time   WBC 7.7 01/31/2021 1953   RBC 4.30 01/31/2021 1953   HGB 12.7 (L) 01/31/2021 1953   HGB 14.9 06/04/2015 0955   HCT 39.1 01/31/2021 1953   HCT 42.5 06/04/2015 0955   PLT 141 (L) 01/31/2021 1953   PLT  176 06/04/2015 0955   MCV 90.9 01/31/2021 1953   MCV 92 06/04/2015 0955   MCH 29.5 01/31/2021 1953   MCHC 32.5 01/31/2021 1953   RDW 14.7 01/31/2021 1953   RDW 12.8 06/04/2015 0955   LYMPHSABS 2.2 01/31/2021 1953   LYMPHSABS 1.7 06/04/2015 0955   MONOABS 0.6 01/31/2021 1953   EOSABS 0.2 01/31/2021 1953   EOSABS 0.3 06/04/2015 0955   BASOSABS 0.0 01/31/2021 1953   BASOSABS 0.0 06/04/2015 0955    Lab Results  Component Value Date   HGBA1C 9.8 (A) 10/19/2021    Assessment & Plan:  1. Type 2 diabetes mellitus with other specified complication, without long-term current use of insulin (HCC) Uncontrolled with A1c of 9.8; goal is <7.0 Ozempic added to regimen after shared decision making; adverse effects discussed Counseled on Diabetic diet, my plate method, 680 minutes of moderate intensity exercise/week Blood sugar logs with fasting goals of 80-120 mg/dl, random of less than 180 and in the event of sugars less than 60 mg/dl or greater than 400  mg/dl encouraged to notify the clinic. Advised on the need for annual eye exams, annual foot exams, Pneumonia vaccine.  - POCT glucose (manual entry) - Semaglutide,0.25 or 0.5MG/DOS, (OZEMPIC, 0.25 OR 0.5 MG/DOSE,) 2 MG/1.5ML SOPN; Inject 0.25 mg into the skin once a week.  Dispense: 2 mL; Refill: 6  2. Diabetic polyneuropathy associated with type 2 diabetes mellitus (HCC) Uncontrolled Increase Gabapentin dose - gabapentin (NEURONTIN) 300 MG capsule; Take 2 capsules (600 mg total) by mouth 2 (two) times daily.  Dispense: 120 capsule; Refill: 6  3. Major depression, chronic Uncontrolled Underlying caregiver stress contributing Increase Zoloft dose - sertraline (ZOLOFT) 100 MG tablet; Take 1 tablet (100 mg total) by mouth daily.  Dispense: 90 tablet; Refill: 1  4. Hypertension associated with diabetes ([place]) No previous diagnosis of HTN however BP at last visit and today have been elevated Amlodipine added - amLODipine (NORVASC) 2.5 MG  tablet; Take 1 tablet (2.5 mg total) by mouth daily.  Dispense: 90 tablet; Refill: 1    Meds ordered this encounter  Medications   amLODipine (NORVASC) 2.5 MG tablet    Sig: Take 1 tablet (2.5 mg total) by mouth daily.    Dispense:  90 tablet    Refill:  1   Semaglutide,0.25 or 0.5MG/DOS, (OZEMPIC, 0.25 OR 0.5 MG/DOSE,) 2 MG/1.5ML SOPN    Sig: Inject 0.25 mg into the skin once a week.    Dispense:  2 mL    Refill:  6   gabapentin (NEURONTIN) 300 MG capsule    Sig: Take 2 capsules (600 mg total) by mouth 2 (two) times daily.    Dispense:  120 capsule    Refill:  6    Dose increase   sertraline (ZOLOFT) 100 MG tablet    Sig: Take 1 tablet (100 mg total) by mouth daily.    Dispense:  90 tablet    Refill:  1    Dose increase    Follow-up: Return in about 3 months (around 03/23/2022) for Chronic medical conditions.       Charlott Rakes, MD, FAAFP. [facility] and [place], [place]   12/21/2021, 4:16 PM

## 2021-12-21 NOTE — Progress Notes (Signed)
Zoloft not working Taking more Gabapentin than prescribed. Dental infections.

## 2021-12-24 ENCOUNTER — Other Ambulatory Visit: Payer: Self-pay

## 2021-12-27 ENCOUNTER — Other Ambulatory Visit: Payer: Self-pay

## 2021-12-30 ENCOUNTER — Other Ambulatory Visit: Payer: Self-pay

## 2022-01-03 ENCOUNTER — Ambulatory Visit (HOSPITAL_COMMUNITY): Payer: Medicaid Other

## 2022-01-04 ENCOUNTER — Ambulatory Visit (HOSPITAL_COMMUNITY): Payer: Medicaid Other

## 2022-01-06 ENCOUNTER — Other Ambulatory Visit: Payer: Self-pay

## 2022-01-25 ENCOUNTER — Other Ambulatory Visit: Payer: Self-pay | Admitting: Family Medicine

## 2022-01-26 ENCOUNTER — Other Ambulatory Visit: Payer: Self-pay

## 2022-01-26 MED ORDER — OMEPRAZOLE 40 MG PO CPDR
40.0000 mg | DELAYED_RELEASE_CAPSULE | Freq: Every day | ORAL | 2 refills | Status: DC
  Filled 2022-01-26: qty 30, 30d supply, fill #0
  Filled 2022-03-08: qty 30, 30d supply, fill #1
  Filled 2022-04-12 – 2022-06-19 (×4): qty 30, 30d supply, fill #2

## 2022-01-27 ENCOUNTER — Other Ambulatory Visit: Payer: Self-pay

## 2022-02-01 ENCOUNTER — Other Ambulatory Visit (HOSPITAL_BASED_OUTPATIENT_CLINIC_OR_DEPARTMENT_OTHER): Payer: Self-pay

## 2022-02-01 ENCOUNTER — Other Ambulatory Visit: Payer: Self-pay

## 2022-02-01 ENCOUNTER — Emergency Department (HOSPITAL_BASED_OUTPATIENT_CLINIC_OR_DEPARTMENT_OTHER)
Admission: EM | Admit: 2022-02-01 | Discharge: 2022-02-01 | Disposition: A | Discharge status: Home / Self Care | Payer: Self-pay | Attending: Emergency Medicine | Admitting: Emergency Medicine

## 2022-02-01 ENCOUNTER — Encounter (HOSPITAL_BASED_OUTPATIENT_CLINIC_OR_DEPARTMENT_OTHER): Payer: Self-pay | Admitting: Obstetrics and Gynecology

## 2022-02-01 ENCOUNTER — Emergency Department (HOSPITAL_BASED_OUTPATIENT_CLINIC_OR_DEPARTMENT_OTHER): Payer: Self-pay

## 2022-02-01 DIAGNOSIS — I10 Essential (primary) hypertension: Secondary | ICD-10-CM | POA: Insufficient documentation

## 2022-02-01 DIAGNOSIS — I825Y1 Chronic embolism and thrombosis of unspecified deep veins of right proximal lower extremity: Secondary | ICD-10-CM | POA: Insufficient documentation

## 2022-02-01 DIAGNOSIS — Z7901 Long term (current) use of anticoagulants: Secondary | ICD-10-CM | POA: Insufficient documentation

## 2022-02-01 DIAGNOSIS — L03115 Cellulitis of right lower limb: Secondary | ICD-10-CM | POA: Insufficient documentation

## 2022-02-01 DIAGNOSIS — D649 Anemia, unspecified: Secondary | ICD-10-CM | POA: Insufficient documentation

## 2022-02-01 DIAGNOSIS — Z79899 Other long term (current) drug therapy: Secondary | ICD-10-CM | POA: Insufficient documentation

## 2022-02-01 DIAGNOSIS — Z7984 Long term (current) use of oral hypoglycemic drugs: Secondary | ICD-10-CM | POA: Insufficient documentation

## 2022-02-01 DIAGNOSIS — G5601 Carpal tunnel syndrome, right upper limb: Secondary | ICD-10-CM | POA: Insufficient documentation

## 2022-02-01 LAB — CBC WITH DIFFERENTIAL/PLATELET
Abs Immature Granulocytes: 0.01 10*3/uL (ref 0.00–0.07)
Basophils Absolute: 0 10*3/uL (ref 0.0–0.1)
Basophils Relative: 0 %
Eosinophils Absolute: 0.1 10*3/uL (ref 0.0–0.5)
Eosinophils Relative: 2 %
HCT: 40.5 % (ref 39.0–52.0)
Hemoglobin: 12.9 g/dL — ABNORMAL LOW (ref 13.0–17.0)
Immature Granulocytes: 0 %
Lymphocytes Relative: 29 %
Lymphs Abs: 1.5 10*3/uL (ref 0.7–4.0)
MCH: 29.6 pg (ref 26.0–34.0)
MCHC: 31.9 g/dL (ref 30.0–36.0)
MCV: 92.9 fL (ref 80.0–100.0)
Monocytes Absolute: 0.3 10*3/uL (ref 0.1–1.0)
Monocytes Relative: 6 %
Neutro Abs: 3.1 10*3/uL (ref 1.7–7.7)
Neutrophils Relative %: 63 %
Platelets: 191 10*3/uL (ref 150–400)
RBC: 4.36 MIL/uL (ref 4.22–5.81)
RDW: 15.2 % (ref 11.5–15.5)
WBC: 5.1 10*3/uL (ref 4.0–10.5)
nRBC: 0 % (ref 0.0–0.2)

## 2022-02-01 LAB — COMPREHENSIVE METABOLIC PANEL
ALT: 17 U/L (ref 0–44)
AST: 17 U/L (ref 15–41)
Albumin: 4.2 g/dL (ref 3.5–5.0)
Alkaline Phosphatase: 80 U/L (ref 38–126)
Anion gap: 10 (ref 5–15)
BUN: 12 mg/dL (ref 6–20)
CO2: 28 mmol/L (ref 22–32)
Calcium: 9.3 mg/dL (ref 8.9–10.3)
Chloride: 106 mmol/L (ref 98–111)
Creatinine, Ser: 0.75 mg/dL (ref 0.61–1.24)
GFR, Estimated: >60 mL/min (ref >60)
Glucose, Bld: 142 mg/dL — ABNORMAL HIGH (ref 70–99)
Potassium: 3.8 mmol/L (ref 3.5–5.1)
Sodium: 144 mmol/L (ref 135–145)
Total Bilirubin: 0.3 mg/dL (ref 0.3–1.2)
Total Protein: 7.1 g/dL (ref 6.5–8.1)

## 2022-02-01 LAB — LACTIC ACID, PLASMA: Lactic Acid, Venous: 1.3 mmol/L (ref 0.5–1.9)

## 2022-02-01 MED ORDER — CEPHALEXIN 500 MG PO CAPS
500.0000 mg | ORAL_CAPSULE | Freq: Four times a day (QID) | ORAL | 0 refills | Status: DC
  Filled 2022-02-01 – 2022-02-02 (×2): qty 20, 5d supply, fill #0

## 2022-02-01 MED ORDER — CEFAZOLIN SODIUM-DEXTROSE 1-4 GM/50ML-% IV SOLN
1.0000 g | Freq: Once | INTRAVENOUS | Status: AC
  Administered 2022-02-01: 1 g via INTRAVENOUS
  Filled 2022-02-01: qty 50

## 2022-02-01 MED ORDER — DOXYCYCLINE HYCLATE 100 MG PO CAPS
100.0000 mg | ORAL_CAPSULE | Freq: Two times a day (BID) | ORAL | 0 refills | Status: DC
  Filled 2022-02-01 – 2022-02-02 (×2): qty 10, 5d supply, fill #0

## 2022-02-01 NOTE — ED Notes (Signed)
Patient in ultrasound at this time.

## 2022-02-01 NOTE — ED Provider Notes (Signed)
[facility] EMERGENCY DEPT Provider Note   CSN: [id number] Arrival date & time: 02/01/22  1829     History  Chief Complaint  Patient presents with   Leg Swelling   Extremity Weakness    Timothy [name] is a 51 y.o. male.   Extremity Weakness     51 year old male with medical history significant for Klinefelter syndrome, multiple PE and DVT on lifelong anticoagulation on Xarelto, TIA, carpal tunnel syndrome bilaterally, GERD who presents to the emergency department with multiple complaints.  The patient states that he sustained a scratch to his right lower extremity several days ago.  He has had swelling to the right leg with increasing redness.  He has a history of DVT in that extremity and is on Xarelto and has been compliant with the medication.  He denies any fevers or chills.  He is worried about infection.  Additionally, he awoke yesterday morning with numbness in the right hand in the distribution of the median nerve.  He denies any other neurologic complaints.  No weakness, no facial droop, no dysarthria, no dysphagia, no other motor deficits.  Home Medications Prior to Admission medications   Medication Sig Start Date End Date Taking? Authorizing Provider  cephALEXin (KEFLEX) 500 MG capsule Take 1 capsule (500 mg total) by mouth 4 (four) times daily. 02/01/22  Yes Regan Lemming, MD  doxycycline (VIBRAMYCIN) 100 MG capsule Take 1 capsule (100 mg total) by mouth 2 (two) times daily for 5 days. 02/01/22 02/06/22 Yes Regan Lemming, MD  amLODipine (NORVASC) 2.5 MG tablet Take 1 tablet (2.5 mg total) by mouth daily. 12/21/21   Charlott Rakes, MD  atorvastatin (LIPITOR) 20 MG tablet Take 1 tablet (20 mg total) by mouth daily. 10/19/21   Charlott Rakes, MD  Blood Glucose Monitoring Suppl (TRUE METRIX METER) w/Device KIT use to test 3 times daily before meals 10/19/21   Charlott Rakes, MD  dapagliflozin propanediol (FARXIGA) 10 MG TABS tablet Take 1 tablet (10 mg total) by  mouth daily before breakfast. 10/19/21   Charlott Rakes, MD  EPINEPHrine (EPIPEN 2-PAK) 0.3 mg/0.3 mL IJ SOAJ injection Inject 0.3 mLs (0.3 mg total) into the muscle once as needed for up to 1 dose (for severe allergic reaction). CAll 911 immediately if you have to use this medicine 04/01/19   Kozlow, Donnamarie Poag, MD  gabapentin (NEURONTIN) 300 MG capsule Take 2 capsules (600 mg total) by mouth 2 (two) times daily. 12/21/21   Charlott Rakes, MD  glucose blood (TRUE METRIX BLOOD GLUCOSE TEST) test strip Use 3 times daily 10/19/21   Charlott Rakes, MD  metFORMIN (GLUCOPHAGE) 1000 MG tablet Take 1 tablet (1,000 mg total) by mouth 2 (two) times daily with a meal. 10/19/21   Charlott Rakes, MD  methadone (DOLOPHINE) 10 MG/ML solution Take 45 mg by mouth daily.     [provider]  mupirocin ointment (BACTROBAN) 2 % Apply to inner nose 2-3 times daily for 5 days. 08/04/21   Vanessa Kick, MD  omeprazole (PRILOSEC) 40 MG capsule Take 1 capsule (40 mg total) by mouth daily. 01/26/22   Charlott Rakes, MD  rivaroxaban (XARELTO) 20 MG TABS tablet Take 1 tablet (20 mg total) by mouth daily with supper. 10/19/21   Charlott Rakes, MD  Semaglutide,0.25 or 0.5MG/DOS, (OZEMPIC, 0.25 OR 0.5 MG/DOSE,) 2 MG/3ML SOPN Inject 0.25 mg into the skin once a week. 12/21/21   Charlott Rakes, MD  sertraline (ZOLOFT) 100 MG tablet Take 1 tablet (100 mg total) by mouth daily.  12/21/21   Charlott Rakes, MD  TRUEplus Lancets 28G MISC use to test blood sugar 3 (three) times daily before meals. 10/19/21   Charlott Rakes, MD      Allergies    Beef-derived products, Ioxaglate, Ivp dye [iodinated contrast media], Pork-derived products, and Shellfish allergy    Review of Systems   Review of Systems  Musculoskeletal:  Positive for extremity weakness.  Skin:  Positive for color change.  Neurological:  Positive for numbness.  All other systems reviewed and are negative.   Physical Exam Updated Vital Signs BP 112/68 (BP Location:  Left Arm)   Pulse (!) 58   Temp 98.2 F (36.8 C)   Resp 18   Ht 6' 4"  (1.93 m)   Wt 117.9 kg   SpO2 97%   BMI 31.65 kg/m  Physical Exam Vitals and nursing note reviewed.  Constitutional:      General: He is not in acute distress. HENT:     Head: Normocephalic and atraumatic.  Eyes:     Conjunctiva/sclera: Conjunctivae normal.     Pupils: Pupils are equal, round, and reactive to light.  Cardiovascular:     Rate and Rhythm: Normal rate and regular rhythm.  Pulmonary:     Effort: Pulmonary effort is normal. No respiratory distress.  Abdominal:     General: There is no distension.     Tenderness: There is no guarding.  Musculoskeletal:        General: No deformity or signs of injury.     Cervical back: Neck supple.     Comments: Right lower extremity swelling compared to the left, erythema, warmth, numbness to palpation about a healing scratch along the lateral aspect of the distal right lower extremity.  Findings consistent with cellulitis.  No crepitus.  Distal right lower extremity neurovascularly intact.  Skin:    Findings: No lesion or rash.  Neurological:     General: No focal deficit present.     Mental Status: He is alert. Mental status is at baseline.     Comments: Cranial nerve deficit.  5 out of 5 strength in the bilateral upper and lower extremities.  Sensory deficit along the distribution of the median nerve in the right hand.    ED Results / Procedures / Treatments   Labs (all labs ordered are listed, but only abnormal results are displayed) Labs Reviewed  COMPREHENSIVE METABOLIC PANEL - Abnormal; Notable for the following components:      Result Value   Glucose, Bld 142 (*)    All other components within normal limits  CBC WITH DIFFERENTIAL/PLATELET - Abnormal; Notable for the following components:   Hemoglobin 12.9 (*)    All other components within normal limits  LACTIC ACID, PLASMA    EKG None  Radiology US Venous Img Lower Unilateral  Right  Result Date: 02/01/2022 CLINICAL DATA:  Right leg swelling after scratch EXAM: RIGHT LOWER EXTREMITY VENOUS DOPPLER ULTRASOUND TECHNIQUE: Gray-scale sonography with graded compression, as well as color Doppler and duplex ultrasound were performed to evaluate the lower extremity deep venous systems from the level of the common femoral vein and including the common femoral, femoral, profunda femoral, popliteal and calf veins including the posterior tibial, peroneal and gastrocnemius veins when visible. The superficial great saphenous vein was also interrogated. Spectral Doppler was utilized to evaluate flow at rest and with distal augmentation maneuvers in the common femoral, femoral and popliteal veins. COMPARISON:  02/01/2021 FINDINGS: Contralateral Common Femoral Vein: Respiratory phasicity is normal and symmetric  with the symptomatic side. No evidence of thrombus. Normal compressibility. Common Femoral Vein: No evidence of thrombus. Normal compressibility, respiratory phasicity and response to augmentation. Saphenofemoral Junction: No evidence of thrombus. Normal compressibility and flow on color Doppler imaging. Profunda Femoral Vein: No evidence of thrombus. Normal compressibility and flow on color Doppler imaging. Femoral Vein: Echogenic, eccentric, partially occlusive thrombus. Popliteal Vein: Echogenic, eccentric, partially occlusive thrombus. Calf Veins: No evidence of thrombus. Normal compressibility and flow on color Doppler imaging. Superficial Great Saphenous Vein: No evidence of thrombus. Normal compressibility. Venous Reflux:  None. Other Findings:  None. IMPRESSION: Partially occlusive eccentric thrombus within the femoral and popliteal veins most consistent with chronic DVT. Electronically Signed   By: Miachel Roux M.D.   On: 02/01/2022 12:02    Procedures Procedures    Medications Ordered in ED Medications  ceFAZolin (ANCEF) IVPB 1 g/50 mL premix (1 g Intravenous New Bag/Given  02/01/22 1205)    ED Course/ Medical Decision Making/ A&P                           Medical Decision Making Amount and/or Complexity of Data Reviewed Labs: ordered.  Risk Prescription drug management.   51 year old male with medical history significant for Klinefelter syndrome, multiple PE and DVT on lifelong anticoagulation on Xarelto, TIA, carpal tunnel syndrome bilaterally, GERD who presents to the emergency department with multiple complaints.  The patient states that he sustained a scratch to his right lower extremity several days ago.  He has had swelling to the right leg with increasing redness.  He has a history of DVT in that extremity and is on Xarelto and has been compliant with the medication.  He denies any fevers or chills.  He is worried about infection.  Additionally, he awoke yesterday morning with numbness in the right hand in the distribution of the median nerve.  He denies any other neurologic complaints.  No weakness, no facial droop, no dysarthria, no dysphagia, no other motor deficits.  On arrival, the patient was vitally stable, afebrile, not tachycardic or tachypneic, mildly hypertensive BP 143/85, saturating her percent on room air.  Sinus rhythm noted on cardiac telemetry.  Physical exam significant for a likely median neuropathy in the left hand with numbness to light touch noted along the median nerve distribution.  No motor deficits or decreased motor function and grip strength in the hand.  No numbness throughout the rest of the arm to suggest acute CVA.  Patient has a longstanding diagnosis of carpal tunnel syndrome.  Suspect worsening of his known carpal tunnel.  Regarding the patient's carpal tunnel syndrome and worsening symptoms, an ambulatory referral to orthopedics was placed.   Additionally, the patient has a history of DVT and is currently on Xarelto and compliant with that medicine.  A DVT ultrasound was performed which revealed persistent chronic partially  occlusive DVT as per below: IMPRESSION:  Partially occlusive eccentric thrombus within the femoral and  popliteal veins most consistent with chronic DVT.    The patient has been compliant with his Xarelto.  Laboratory evaluation significant for CBC without a leukocytosis, stable anemia 12.9, CMP without significant electrolyte abnormality, mild hyperglycemia to 142, lactic acid normal.  Patient was administered IV Ancef.  His physical exam of the right lower extremity was concerning for cellulitis in the right lower extremity.  No crepitus to suggest necrotizing fasciitis.  No fluctuance to suggest abscess.  Patient is neurovascular intact in the distal right  lower extremity.  Considered admission for observation however the patient has not yet trialed outpatient antibiotics for cellulitis.  I recommended the patient trial oral antibiotics and provided return precautions in the event of worsening symptoms which were then discussed with the patient.  Overall stable for discharge and outpatient management at this time.   Final Clinical Impression(s) / ED Diagnoses Final diagnoses:  Chronic deep vein thrombosis (DVT) of proximal vein of right lower extremity (HCC)  Cellulitis of right lower extremity  Carpal tunnel syndrome of right wrist    Rx / DC Orders ED Discharge Orders          Ordered    cephALEXin (KEFLEX) 500 MG capsule  4 times daily        02/01/22 1230    doxycycline (VIBRAMYCIN) 100 MG capsule  2 times daily        02/01/22 1230    Ambulatory referral to Hand Surgery        02/01/22 1231              Regan Lemming, MD 02/01/22 1845

## 2022-02-01 NOTE — ED Notes (Signed)
Patient given discharge instructions. Questions were answered. Patient verbalized understanding of discharge instructions and care at home.  

## 2022-02-01 NOTE — Discharge Instructions (Addendum)
Your ultrasound was positive for chronic partially occlusive DVT in the right lower extremity.  You do have skin changes concerning for developing cellulitis of the right lower extremity.  We will place you on antibiotics for treatment of cellulitis.  Continue your outpatient anticoagulation regimen. Follow-up with a primary care provider for long-term management.  Regarding your right hand numbness, your symptoms are consistent with likely worsening carpal tunnel syndrome and median nerve compression.  Given your worsening symptoms, carpal tunnel syndrome can sometimes be managed surgically via decompression.  Referral to a hand specialist has been placed.

## 2022-02-01 NOTE — ED Triage Notes (Signed)
Patient reports Saturday he got a scratch of some sort to his right leg, which caused an allergic reaction and he took an epi pen but his leg continued to swell. Patient's right leg is red and obviously swollen and he reports yesterday he woke with a numb right arm at 0800 and it has continued to be numb since.

## 2022-02-02 ENCOUNTER — Other Ambulatory Visit (HOSPITAL_BASED_OUTPATIENT_CLINIC_OR_DEPARTMENT_OTHER): Payer: Self-pay

## 2022-02-02 ENCOUNTER — Other Ambulatory Visit: Payer: Self-pay

## 2022-02-02 MED ORDER — DOXYCYCLINE HYCLATE 100 MG PO TABS
100.0000 mg | ORAL_TABLET | Freq: Two times a day (BID) | ORAL | 0 refills | Status: DC
  Filled 2022-02-02: qty 10, 5d supply, fill #0

## 2022-02-03 ENCOUNTER — Other Ambulatory Visit: Payer: Self-pay

## 2022-02-08 ENCOUNTER — Encounter: Payer: Self-pay | Admitting: Family Medicine

## 2022-03-04 ENCOUNTER — Other Ambulatory Visit: Payer: Self-pay

## 2022-03-08 ENCOUNTER — Other Ambulatory Visit: Payer: Self-pay

## 2022-03-21 ENCOUNTER — Other Ambulatory Visit: Payer: Self-pay

## 2022-03-21 ENCOUNTER — Encounter (HOSPITAL_BASED_OUTPATIENT_CLINIC_OR_DEPARTMENT_OTHER): Payer: Self-pay | Admitting: Family Medicine

## 2022-03-21 DIAGNOSIS — E1142 Type 2 diabetes mellitus with diabetic polyneuropathy: Secondary | ICD-10-CM

## 2022-03-22 ENCOUNTER — Other Ambulatory Visit: Payer: Self-pay

## 2022-03-22 MED ORDER — GABAPENTIN 300 MG PO CAPS
600.0000 mg | ORAL_CAPSULE | Freq: Three times a day (TID) | ORAL | 6 refills | Status: DC
  Filled 2022-03-22 – 2022-04-12 (×2): qty 180, 30d supply, fill #0
  Filled 2022-05-15 – 2022-06-19 (×2): qty 180, 30d supply, fill #1
  Filled 2022-07-17 – 2022-08-03 (×2): qty 180, 30d supply, fill #2
  Filled 2022-08-28: qty 180, 30d supply, fill #3
  Filled 2022-10-01: qty 180, 30d supply, fill #4
  Filled 2022-12-03 – 2023-01-10 (×3): qty 180, 30d supply, fill #5
  Filled 2023-02-15: qty 180, 30d supply, fill #6

## 2022-03-22 NOTE — Telephone Encounter (Signed)

## 2022-04-12 ENCOUNTER — Other Ambulatory Visit: Payer: Self-pay

## 2022-04-12 ENCOUNTER — Encounter: Payer: Self-pay | Admitting: Family Medicine

## 2022-04-12 ENCOUNTER — Ambulatory Visit: Payer: Medicaid Other | Attending: Family Medicine | Admitting: Family Medicine

## 2022-04-12 VITALS — BP 123/78 | HR 71 | Temp 98.2°F | Ht 76.0 in | Wt 260.0 lb

## 2022-04-12 DIAGNOSIS — E1169 Type 2 diabetes mellitus with other specified complication: Secondary | ICD-10-CM

## 2022-04-12 DIAGNOSIS — G5603 Carpal tunnel syndrome, bilateral upper limbs: Secondary | ICD-10-CM

## 2022-04-12 DIAGNOSIS — F329 Major depressive disorder, single episode, unspecified: Secondary | ICD-10-CM

## 2022-04-12 DIAGNOSIS — I2699 Other pulmonary embolism without acute cor pulmonale: Secondary | ICD-10-CM

## 2022-04-12 DIAGNOSIS — Z1211 Encounter for screening for malignant neoplasm of colon: Secondary | ICD-10-CM

## 2022-04-12 LAB — GLUCOSE, POCT (MANUAL RESULT ENTRY): POC Glucose: 162 mg/dl — AB (ref 70–99)

## 2022-04-12 LAB — POCT GLYCOSYLATED HEMOGLOBIN (HGB A1C): HbA1c, POC (controlled diabetic range): 6.8 % (ref 0.0–7.0)

## 2022-04-12 MED ORDER — PREDNISONE 20 MG PO TABS
20.0000 mg | ORAL_TABLET | Freq: Every day | ORAL | 0 refills | Status: DC
  Filled 2022-04-12: qty 5, 5d supply, fill #0

## 2022-04-12 MED ORDER — DULOXETINE HCL 60 MG PO CPEP
60.0000 mg | ORAL_CAPSULE | Freq: Every day | ORAL | 3 refills | Status: DC
  Filled 2022-04-12: qty 30, 30d supply, fill #0
  Filled 2022-05-15 – 2022-06-19 (×2): qty 30, 30d supply, fill #1
  Filled 2022-07-17 – 2022-07-27 (×2): qty 30, 30d supply, fill #2
  Filled 2022-08-28: qty 30, 30d supply, fill #3

## 2022-04-12 NOTE — Patient Instructions (Signed)

## 2022-04-12 NOTE — Progress Notes (Signed)
Subjective:  Patient ID: Timothy [name], male    DOB: June 25, 1970  Age: 51 y.o. MRN: [id number]  CC: Diabetes   HPI Timothy [name] is a 51 y.o. year old male with a history of type 2 diabetes mellitus (A1c 9.8), Klinefelter syndrome, history of PE, history of recurrent DVT (in 01/2021, 01/2022 on chronic anticoagulation), depression chronic methadone therapy (for heroine addiction)   Interval History: A1c is 6.8 down from 9.8 previously.  Endorses adherence with his diabetic medications.  Also adherent with his statin.  Continues to complain of pain in the tips of his left fingers and had called the clinic with this concerns, gabapentin dose was increased to 600 mg 3 times daily. He has to wrap his fingers with tape which provides some relief. He was seen at the ED and referred to a hand specialist who informed him he would need cortisone injections and possible surgery which he had no insurance to proceed with.  Also diagnosed with RIGHT LOWER EXTREMITY DVT and he endorses adherence with his Xarelto.  His Zoloft has not been working and he quit taking it for management of his anxiety and depression. Past Medical History:  Diagnosis Date   Carpal tunnel syndrome on both sides 03/28/2013   Chronic headache    DVT (deep venous thrombosis) ([place])    2017-2018: 4 dvts   GERD (gastroesophageal reflux disease)    Helicobacter pylori gastritis 06/30/06   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   Pulmonary embolism ([place])    2013: 2 total   TIA (transient ischemic attack)     Past Surgical History:  Procedure Laterality Date   KNEE SURGERY Right 10/2016    Family History  Problem Relation Age of Onset   Barrett's esophagus Mother    Breast cancer Mother    Irritable bowel syndrome Brother    Allergies Brother    Diabetes Brother    Heart disease Father    Heart attack Father    Diabetes Sister    Diabetes Paternal Grandfather    Heart disease Paternal Grandfather    Heart disease  Paternal Grandmother    Heart disease Maternal Grandmother    Heart disease Maternal Grandfather    Colon cancer Neg Hx    Esophageal cancer Neg Hx     Social History   Socioeconomic History   Marital status: Legally Separated    Spouse name: Not on file   Number of children: 1   Years of education: Not on file   Highest education level: Not on file  Occupational History   Occupation: Full time student  Tobacco Use   Smoking status: Never   Smokeless tobacco: Former    Quit date: 2013  Vaping Use   Vaping Use: Former  Substance and Sexual Activity   Alcohol use: No    Alcohol/week: 0.0 standard drinks of alcohol   Drug use: Not Currently    Types: Other-see comments, Oxycodone, Heroin    Comment: Former Use - 12/04/15   Sexual activity: Not Currently    Partners: Female  Other Topics Concern   Not on file  Social History Narrative   Lives with wife and children in a one story home.  Has 2 children.     Works in Scientist, research (medical).     Education: college.   Social Determinants of Health   Financial Resource Strain: Not on file  Food Insecurity: Not on file  Transportation Needs: Not on file  Physical Activity: Not on file  Stress: Not on file  Social Connections: Not on file    Allergies  Allergen Reactions   Beef-Derived Products Anaphylaxis    Tick bite    Ioxaglate Shortness Of Breath and Other (See Comments)    dizziness   Ivp Dye [Iodinated Contrast Media] Shortness Of Breath and Other (See Comments)    dizziness   Pork-Derived Products Anaphylaxis    From Tick bite   Shellfish Allergy     Outpatient Medications Prior to Visit  Medication Sig Dispense Refill   amLODipine (NORVASC) 2.5 MG tablet Take 1 tablet (2.5 mg total) by mouth daily. 90 tablet 1   atorvastatin (LIPITOR) 20 MG tablet Take 1 tablet (20 mg total) by mouth daily. 30 tablet 6   Blood Glucose Monitoring Suppl (TRUE METRIX METER) w/Device KIT use to test 3 times daily before meals 1 kit 0    dapagliflozin propanediol (FARXIGA) 10 MG TABS tablet Take 1 tablet (10 mg total) by mouth daily before breakfast. 30 tablet 6   EPINEPHrine (EPIPEN 2-PAK) 0.3 mg/0.3 mL IJ SOAJ injection Inject 0.3 mLs (0.3 mg total) into the muscle once as needed for up to 1 dose (for severe allergic reaction). CAll 911 immediately if you have to use this medicine 2 each 0   gabapentin (NEURONTIN) 300 MG capsule Take 2 capsules (600 mg total) by mouth 3 (three) times daily. 180 capsule 6   glucose blood (TRUE METRIX BLOOD GLUCOSE TEST) test strip Use 3 times daily 100 each 12   metFORMIN (GLUCOPHAGE) 1000 MG tablet Take 1 tablet (1,000 mg total) by mouth 2 (two) times daily with a meal. 60 tablet 6   methadone (DOLOPHINE) 10 MG/ML solution Take 45 mg by mouth daily.      omeprazole (PRILOSEC) 40 MG capsule Take 1 capsule (40 mg total) by mouth daily. 30 capsule 2   rivaroxaban (XARELTO) 20 MG TABS tablet Take 1 tablet (20 mg total) by mouth daily with supper. 30 tablet 6   Semaglutide,0.25 or 0.5MG/DOS, (OZEMPIC, 0.25 OR 0.5 MG/DOSE,) 2 MG/3ML SOPN Inject 0.25 mg into the skin once a week. 3 mL 6   TRUEplus Lancets 28G MISC use to test blood sugar 3 (three) times daily before meals. 100 each 12   sertraline (ZOLOFT) 100 MG tablet Take 1 tablet (100 mg total) by mouth daily. 90 tablet 1   cephALEXin (KEFLEX) 500 MG capsule Take 1 capsule (500 mg total) by mouth 4 (four) times daily. (Patient not taking: Reported on 04/12/2022) 20 capsule 0   doxycycline (VIBRA-TABS) 100 MG tablet Take 1 tablet (100 mg total) by mouth 2 (two) times daily for 5 days. (Patient not taking: Reported on 04/12/2022) 10 tablet 0   mupirocin ointment (BACTROBAN) 2 % Apply to inner nose 2-3 times daily for 5 days. 22 g 0   No facility-administered medications prior to visit.     ROS Review of Systems  Constitutional:  Negative for activity change and appetite change.  HENT:  Negative for sinus pressure and sore throat.   Eyes:  Negative  for visual disturbance.  Respiratory:  Negative for cough, chest tightness and shortness of breath.   Cardiovascular:  Negative for chest pain and leg swelling.  Gastrointestinal:  Negative for abdominal distention, abdominal pain, constipation and diarrhea.  Endocrine: Negative.   Genitourinary:  Negative for dysuria.  Musculoskeletal:  Negative for joint swelling and myalgias.  Skin:  Negative for rash.  Allergic/Immunologic: Negative.   Neurological:  Positive for numbness. Negative for  weakness and light-headedness.  Psychiatric/Behavioral:  Negative for dysphoric mood and suicidal ideas.     Objective:  BP 123/78   Pulse 71   Temp 98.2 F (36.8 C) (Oral)   Ht _0  (1.93 m)   Wt 260 lb (117.9 kg)   SpO2 96%   BMI 31.65 kg/m      04/12/2022    3:51 PM 02/01/2022    1:11 PM 02/01/2022   12:28 PM  BP/Weight  Systolic BP 656 812 751  Diastolic BP 78 65 68  Wt. (Lbs) 260    BMI 31.65 kg/m2        Physical Exam Constitutional:      Appearance: He is well-developed.  Cardiovascular:     Rate and Rhythm: Normal rate.     Heart sounds: Normal heart sounds. No murmur heard. Pulmonary:     Effort: Pulmonary effort is normal.     Breath sounds: Normal breath sounds. No wheezing or rales.  Chest:     Chest wall: No tenderness.  Abdominal:     General: Bowel sounds are normal. There is no distension.     Palpations: Abdomen is soft. There is no mass.     Tenderness: There is no abdominal tenderness.  Musculoskeletal:        General: Normal range of motion.     Right lower leg: No edema.     Left lower leg: No edema.     Comments: Negative Phalen and Tinel signs  Neurological:     Mental Status: He is alert and oriented to person, place, and time.  Psychiatric:        Mood and Affect: Mood normal.        Latest Ref Rng & Units 02/01/2022    9:11 AM 10/19/2021    2:37 PM 01/31/2021    7:53 PM  CMP  Glucose 70 - 99 mg/dL 142  339  209   BUN 6 - 20 mg/dL _1 Creatinine 0.61 - 1.24 mg/dL 0.75  0.82  0.69   Sodium 135 - 145 mmol/L 144  141  139   Potassium 3.5 - 5.1 mmol/L 3.8  4.6  4.2   Chloride 98 - 111 mmol/L 106  102  104   CO2 22 - 32 mmol/L _2 Calcium 8.9 - 10.3 mg/dL 9.3  9.5  9.4   Total Protein 6.5 - 8.1 g/dL 7.1  7.4    Total Bilirubin 0.3 - 1.2 mg/dL 0.3  <0.2    Alkaline Phos 38 - 126 U/L 80  117    AST 15 - 41 U/L 17  30    ALT 0 - 44 U/L 17  29      Lipid Panel     Component Value Date/Time   CHOL 153 02/17/2021 1130   TRIG 293 (H) 02/17/2021 1130   HDL 24 (L) 02/17/2021 1130   CHOLHDL 6.4 (H) 02/17/2021 1130   LDLCALC 81 02/17/2021 1130    CBC    Component Value Date/Time   WBC 5.1 02/01/2022 0911   RBC 4.36 02/01/2022 0911   HGB 12.9 (L) 02/01/2022 0911   HGB 14.9 06/04/2015 0955   HCT 40.5 02/01/2022 0911   HCT 42.5 06/04/2015 0955   PLT 191 02/01/2022 0911   PLT 176 06/04/2015 0955   MCV 92.9 02/01/2022 0911   MCV 92 06/04/2015 0955   MCH 29.6 02/01/2022 0911   MCHC  31.9 02/01/2022 0911   RDW 15.2 02/01/2022 0911   RDW 12.8 06/04/2015 0955   LYMPHSABS 1.5 02/01/2022 0911   LYMPHSABS 1.7 06/04/2015 0955   MONOABS 0.3 02/01/2022 0911   EOSABS 0.1 02/01/2022 0911   EOSABS 0.3 06/04/2015 0955   BASOSABS 0.0 02/01/2022 0911   BASOSABS 0.0 06/04/2015 0955    Lab Results  Component Value Date   HGBA1C 6.8 04/12/2022    Assessment & Plan:  1. Type 2 diabetes mellitus with other specified complication, without long-term current use of insulin ([place]) Commended on improvement in his A1c which is at 6.8 down from 9.8 Continue current regimen Counseled on Diabetic diet, my plate method, 721 minutes of moderate intensity exercise/week Blood sugar logs with fasting goals of 80-120 mg/dl, random of less than 180 and in the event of sugars less than 60 mg/dl or greater than 400 mg/dl encouraged to notify the clinic. Advised on the need for annual eye exams, annual foot exams, Pneumonia  vaccine. - POCT glucose (manual entry) - POCT glycosylated hemoglobin (Hb A1C) - Microalbumin/Creatinine Ratio, Urine  2. Pulmonary embolism, unspecified chronicity, unspecified pulmonary embolism type, unspecified whether acute cor pulmonale present ([place]) Due to recurrent PE and DVT he remains on lifelong anticoagulation with Xarelto  3. Carpal tunnel syndrome on both sides Uncontrolled He will benefit from cortisone injection versus carpal tunnel release surgery Symptoms are more predominant in his left hand Advised to apply for the [facility] financial discount to facilitate orthopedic procedure - predniSONE (DELTASONE) 20 MG tablet; Take 1 tablet (20 mg total) by mouth daily with breakfast.  Dispense: 5 tablet; Refill: 0 - DULoxetine (CYMBALTA) 60 MG capsule; Take 1 capsule (60 mg total) by mouth daily.  Dispense: 30 capsule; Refill: 3  4. Major depression, chronic Uncontrolled on Zoloft We will substitute with Cymbalta - DULoxetine (CYMBALTA) 60 MG capsule; Take 1 capsule (60 mg total) by mouth daily.  Dispense: 30 capsule; Refill: 3  5. Screening for colon cancer - Fecal occult blood, imunochemical([facility])   Meds ordered this encounter  Medications   predniSONE (DELTASONE) 20 MG tablet    Sig: Take 1 tablet (20 mg total) by mouth daily with breakfast.    Dispense:  5 tablet    Refill:  0   DULoxetine (CYMBALTA) 60 MG capsule    Sig: Take 1 capsule (60 mg total) by mouth daily.    Dispense:  30 capsule    Refill:  3    Discontinue Zoloft    Follow-up: Return in about 6 months (around 10/11/2022) for Chronic medical conditions.       Charlott Rakes, MD, FAAFP. [facility] and [place], [place]   04/12/2022, 4:22 PM

## 2022-04-12 NOTE — Progress Notes (Signed)
Having pain in fingers and hands.

## 2022-04-13 ENCOUNTER — Other Ambulatory Visit: Payer: Self-pay

## 2022-04-14 LAB — MICROALBUMIN / CREATININE URINE RATIO
Creatinine, Urine: 70.2 mg/dL
Microalb/Creat Ratio: <4 mg/g creat (ref 0–29)
Microalbumin, Urine: <3 ug/mL

## 2022-04-18 ENCOUNTER — Other Ambulatory Visit: Payer: Self-pay

## 2022-04-19 ENCOUNTER — Other Ambulatory Visit: Payer: Self-pay

## 2022-04-21 ENCOUNTER — Other Ambulatory Visit: Payer: Self-pay

## 2022-04-22 ENCOUNTER — Other Ambulatory Visit: Payer: Self-pay

## 2022-04-26 ENCOUNTER — Other Ambulatory Visit: Payer: Self-pay

## 2022-05-03 ENCOUNTER — Other Ambulatory Visit: Payer: Self-pay

## 2022-05-06 ENCOUNTER — Other Ambulatory Visit: Payer: Self-pay

## 2022-05-12 ENCOUNTER — Other Ambulatory Visit: Payer: Self-pay

## 2022-05-13 ENCOUNTER — Other Ambulatory Visit: Payer: Self-pay

## 2022-05-16 ENCOUNTER — Other Ambulatory Visit: Payer: Self-pay

## 2022-05-20 ENCOUNTER — Other Ambulatory Visit: Payer: Self-pay

## 2022-05-24 ENCOUNTER — Other Ambulatory Visit: Payer: Self-pay

## 2022-05-31 ENCOUNTER — Other Ambulatory Visit: Payer: Self-pay

## 2022-06-07 ENCOUNTER — Ambulatory Visit (HOSPITAL_COMMUNITY): Payer: Medicaid Other

## 2022-06-13 ENCOUNTER — Emergency Department (HOSPITAL_COMMUNITY): Payer: BLUE CROSS/BLUE SHIELD

## 2022-06-13 ENCOUNTER — Inpatient Hospital Stay (HOSPITAL_COMMUNITY)
Admission: EM | Admit: 2022-06-13 | Discharge: 2022-06-15 | DRG: 193 | Disposition: A | Discharge status: Home / Self Care | Payer: BLUE CROSS/BLUE SHIELD | Attending: Internal Medicine | Admitting: Internal Medicine

## 2022-06-13 ENCOUNTER — Encounter (HOSPITAL_COMMUNITY): Payer: Self-pay | Admitting: Emergency Medicine

## 2022-06-13 DIAGNOSIS — Z91013 Allergy to seafood: Secondary | ICD-10-CM

## 2022-06-13 DIAGNOSIS — Z8673 Personal history of transient ischemic attack (TIA), and cerebral infarction without residual deficits: Secondary | ICD-10-CM

## 2022-06-13 DIAGNOSIS — Z820 Family history of epilepsy and other diseases of the nervous system: Secondary | ICD-10-CM

## 2022-06-13 DIAGNOSIS — J189 Pneumonia, unspecified organism: Secondary | ICD-10-CM | POA: Diagnosis not present

## 2022-06-13 DIAGNOSIS — E1140 Type 2 diabetes mellitus with diabetic neuropathy, unspecified: Secondary | ICD-10-CM | POA: Diagnosis present

## 2022-06-13 DIAGNOSIS — K219 Gastro-esophageal reflux disease without esophagitis: Secondary | ICD-10-CM | POA: Diagnosis present

## 2022-06-13 DIAGNOSIS — Q984 Klinefelter syndrome, unspecified: Secondary | ICD-10-CM

## 2022-06-13 DIAGNOSIS — Z7901 Long term (current) use of anticoagulants: Secondary | ICD-10-CM

## 2022-06-13 DIAGNOSIS — E119 Type 2 diabetes mellitus without complications: Secondary | ICD-10-CM | POA: Diagnosis present

## 2022-06-13 DIAGNOSIS — Z86711 Personal history of pulmonary embolism: Secondary | ICD-10-CM

## 2022-06-13 DIAGNOSIS — E785 Hyperlipidemia, unspecified: Secondary | ICD-10-CM | POA: Diagnosis present

## 2022-06-13 DIAGNOSIS — Z91041 Radiographic dye allergy status: Secondary | ICD-10-CM

## 2022-06-13 DIAGNOSIS — Z7984 Long term (current) use of oral hypoglycemic drugs: Secondary | ICD-10-CM

## 2022-06-13 DIAGNOSIS — Z91014 Allergy to mammalian meats: Secondary | ICD-10-CM

## 2022-06-13 DIAGNOSIS — U071 COVID-19: Secondary | ICD-10-CM

## 2022-06-13 DIAGNOSIS — I10 Essential (primary) hypertension: Secondary | ICD-10-CM | POA: Diagnosis present

## 2022-06-13 DIAGNOSIS — Z79899 Other long term (current) drug therapy: Secondary | ICD-10-CM

## 2022-06-13 DIAGNOSIS — F1910 Other psychoactive substance abuse, uncomplicated: Secondary | ICD-10-CM | POA: Diagnosis present

## 2022-06-13 DIAGNOSIS — F419 Anxiety disorder, unspecified: Secondary | ICD-10-CM | POA: Diagnosis present

## 2022-06-13 DIAGNOSIS — Z833 Family history of diabetes mellitus: Secondary | ICD-10-CM

## 2022-06-13 DIAGNOSIS — Z888 Allergy status to other drugs, medicaments and biological substances status: Secondary | ICD-10-CM

## 2022-06-13 DIAGNOSIS — J9601 Acute respiratory failure with hypoxia: Secondary | ICD-10-CM

## 2022-06-13 DIAGNOSIS — Z1152 Encounter for screening for COVID-19: Secondary | ICD-10-CM

## 2022-06-13 DIAGNOSIS — F32A Depression, unspecified: Secondary | ICD-10-CM | POA: Diagnosis present

## 2022-06-13 DIAGNOSIS — Z86718 Personal history of other venous thrombosis and embolism: Secondary | ICD-10-CM

## 2022-06-13 DIAGNOSIS — Z8616 Personal history of COVID-19: Secondary | ICD-10-CM

## 2022-06-13 HISTORY — DX: Type 2 diabetes mellitus without complications: E11.9

## 2022-06-13 LAB — CBC WITH DIFFERENTIAL/PLATELET
Abs Immature Granulocytes: 0.01 10*3/uL (ref 0.00–0.07)
Basophils Absolute: 0 10*3/uL (ref 0.0–0.1)
Basophils Relative: 0 %
Eosinophils Absolute: 0 10*3/uL (ref 0.0–0.5)
Eosinophils Relative: 0 %
HCT: 39.8 % (ref 39.0–52.0)
Hemoglobin: 12.9 g/dL — ABNORMAL LOW (ref 13.0–17.0)
Immature Granulocytes: 0 %
Lymphocytes Relative: 24 %
Lymphs Abs: 0.6 10*3/uL — ABNORMAL LOW (ref 0.7–4.0)
MCH: 28.9 pg (ref 26.0–34.0)
MCHC: 32.4 g/dL (ref 30.0–36.0)
MCV: 89.2 fL (ref 80.0–100.0)
Monocytes Absolute: 0.2 10*3/uL (ref 0.1–1.0)
Monocytes Relative: 7 %
Neutro Abs: 1.8 10*3/uL (ref 1.7–7.7)
Neutrophils Relative %: 69 %
Platelets: 108 10*3/uL — ABNORMAL LOW (ref 150–400)
RBC: 4.46 MIL/uL (ref 4.22–5.81)
RDW: 15.3 % (ref 11.5–15.5)
WBC: 2.6 10*3/uL — ABNORMAL LOW (ref 4.0–10.5)
nRBC: 0 % (ref 0.0–0.2)

## 2022-06-13 LAB — LIPID PANEL
Cholesterol: 87 mg/dL (ref 0–200)
HDL: 16 mg/dL — ABNORMAL LOW (ref >40)
LDL Cholesterol: 48 mg/dL (ref 0–99)
Total CHOL/HDL Ratio: 5.4 RATIO
Triglycerides: 113 mg/dL (ref <150)
VLDL: 23 mg/dL (ref 0–40)

## 2022-06-13 LAB — COMPREHENSIVE METABOLIC PANEL
ALT: 24 U/L (ref 0–44)
AST: 42 U/L — ABNORMAL HIGH (ref 15–41)
Albumin: 3.1 g/dL — ABNORMAL LOW (ref 3.5–5.0)
Alkaline Phosphatase: 116 U/L (ref 38–126)
Anion gap: 12 (ref 5–15)
BUN: 5 mg/dL — ABNORMAL LOW (ref 6–20)
CO2: 27 mmol/L (ref 22–32)
Calcium: 8.7 mg/dL — ABNORMAL LOW (ref 8.9–10.3)
Chloride: 98 mmol/L (ref 98–111)
Creatinine, Ser: 0.63 mg/dL (ref 0.61–1.24)
GFR, Estimated: >60 mL/min (ref >60)
Glucose, Bld: 168 mg/dL — ABNORMAL HIGH (ref 70–99)
Potassium: 4 mmol/L (ref 3.5–5.1)
Sodium: 137 mmol/L (ref 135–145)
Total Bilirubin: 0.4 mg/dL (ref 0.3–1.2)
Total Protein: 6.6 g/dL (ref 6.5–8.1)

## 2022-06-13 LAB — URINALYSIS, ROUTINE W REFLEX MICROSCOPIC
Bacteria, UA: NONE SEEN
Bilirubin Urine: NEGATIVE
Glucose, UA: NEGATIVE mg/dL
Hgb urine dipstick: NEGATIVE
Ketones, ur: NEGATIVE mg/dL
Leukocytes,Ua: NEGATIVE
Nitrite: NEGATIVE
Protein, ur: 30 mg/dL — AB
Specific Gravity, Urine: 1.012 (ref 1.005–1.030)
pH: 8 (ref 5.0–8.0)

## 2022-06-13 LAB — LACTIC ACID, PLASMA
Lactic Acid, Venous: 1.2 mmol/L (ref 0.5–1.9)
Lactic Acid, Venous: 0.8 mmol/L (ref 0.5–1.9)

## 2022-06-13 LAB — RESP PANEL BY RT-PCR (RSV, FLU A&B, COVID)  RVPGX2
Influenza A by PCR: NEGATIVE
Influenza B by PCR: NEGATIVE
Resp Syncytial Virus by PCR: NEGATIVE
SARS Coronavirus 2 by RT PCR: NEGATIVE

## 2022-06-13 LAB — HIV ANTIBODY (ROUTINE TESTING W REFLEX): HIV Screen 4th Generation wRfx: NONREACTIVE

## 2022-06-13 LAB — TROPONIN I (HIGH SENSITIVITY)
Troponin I (High Sensitivity): 5 ng/L (ref <18)
Troponin I (High Sensitivity): 5 ng/L (ref <18)

## 2022-06-13 LAB — HEMOGLOBIN A1C
Hgb A1c MFr Bld: 8.3 % — ABNORMAL HIGH (ref 4.8–5.6)
Mean Plasma Glucose: 191.51 mg/dL

## 2022-06-13 LAB — GLUCOSE, CAPILLARY
Glucose-Capillary: 112 mg/dL — ABNORMAL HIGH (ref 70–99)
Glucose-Capillary: 149 mg/dL — ABNORMAL HIGH (ref 70–99)

## 2022-06-13 LAB — BRAIN NATRIURETIC PEPTIDE: B Natriuretic Peptide: 26.5 pg/mL (ref 0.0–100.0)

## 2022-06-13 MED ORDER — METHADONE HCL 10 MG PO TABS
70.0000 mg | ORAL_TABLET | Freq: Once | ORAL | Status: AC
  Administered 2022-06-13: 70 mg via ORAL
  Filled 2022-06-13: qty 7

## 2022-06-13 MED ORDER — SODIUM CHLORIDE 0.9 % IV SOLN
1.0000 g | Freq: Once | INTRAVENOUS | Status: AC
  Administered 2022-06-13: 1 g via INTRAVENOUS
  Filled 2022-06-13: qty 10

## 2022-06-13 MED ORDER — ATORVASTATIN CALCIUM 10 MG PO TABS
20.0000 mg | ORAL_TABLET | Freq: Every day | ORAL | Status: DC
  Administered 2022-06-13 – 2022-06-15 (×3): 20 mg via ORAL
  Filled 2022-06-13 (×3): qty 2

## 2022-06-13 MED ORDER — INSULIN ASPART 100 UNIT/ML IJ SOLN
0.0000 [IU] | Freq: Three times a day (TID) | INTRAMUSCULAR | Status: DC
  Administered 2022-06-14: 2 [IU] via SUBCUTANEOUS
  Administered 2022-06-14: 3 [IU] via SUBCUTANEOUS
  Administered 2022-06-15 (×2): 2 [IU] via SUBCUTANEOUS

## 2022-06-13 MED ORDER — LACTATED RINGERS IV BOLUS
1000.0000 mL | Freq: Once | INTRAVENOUS | Status: AC
Start: 2022-06-13 — End: 2022-06-13
  Administered 2022-06-13: 1000 mL via INTRAVENOUS

## 2022-06-13 MED ORDER — PANTOPRAZOLE SODIUM 40 MG PO TBEC
40.0000 mg | DELAYED_RELEASE_TABLET | Freq: Every day | ORAL | Status: DC
  Administered 2022-06-14 – 2022-06-15 (×2): 40 mg via ORAL
  Filled 2022-06-13 (×2): qty 1

## 2022-06-13 MED ORDER — RIVAROXABAN 20 MG PO TABS
20.0000 mg | ORAL_TABLET | Freq: Every day | ORAL | Status: DC
  Administered 2022-06-13 – 2022-06-14 (×2): 20 mg via ORAL
  Filled 2022-06-13 (×2): qty 1

## 2022-06-13 MED ORDER — AZITHROMYCIN 250 MG PO TABS
500.0000 mg | ORAL_TABLET | Freq: Every day | ORAL | Status: AC
  Administered 2022-06-14 – 2022-06-15 (×2): 500 mg via ORAL
  Filled 2022-06-13 (×2): qty 2

## 2022-06-13 MED ORDER — SODIUM CHLORIDE 0.9 % IV SOLN
500.0000 mg | Freq: Once | INTRAVENOUS | Status: AC
  Administered 2022-06-13: 500 mg via INTRAVENOUS
  Filled 2022-06-13: qty 5

## 2022-06-13 MED ORDER — ACETAMINOPHEN 650 MG RE SUPP: 650.0000 mg | Freq: Four times a day (QID) | RECTAL | Status: DC | PRN

## 2022-06-13 MED ORDER — ONDANSETRON HCL 4 MG/2ML IJ SOLN
4.0000 mg | Freq: Four times a day (QID) | INTRAMUSCULAR | Status: DC | PRN
  Administered 2022-06-13 – 2022-06-15 (×6): 4 mg via INTRAVENOUS
  Filled 2022-06-13 (×6): qty 2

## 2022-06-13 MED ORDER — ALBUTEROL SULFATE (2.5 MG/3ML) 0.083% IN NEBU
2.5000 mg | INHALATION_SOLUTION | Freq: Four times a day (QID) | RESPIRATORY_TRACT | Status: DC | PRN
  Administered 2022-06-14: 2.5 mg via RESPIRATORY_TRACT
  Filled 2022-06-13: qty 3

## 2022-06-13 MED ORDER — GABAPENTIN 300 MG PO CAPS
600.0000 mg | ORAL_CAPSULE | Freq: Three times a day (TID) | ORAL | Status: DC
  Administered 2022-06-13 – 2022-06-15 (×6): 600 mg via ORAL
  Filled 2022-06-13 (×7): qty 2

## 2022-06-13 MED ORDER — SODIUM CHLORIDE 0.9 % IV SOLN
1.0000 g | INTRAVENOUS | Status: DC
  Administered 2022-06-14 – 2022-06-15 (×2): 1 g via INTRAVENOUS
  Filled 2022-06-13 (×2): qty 10

## 2022-06-13 MED ORDER — DAPAGLIFLOZIN PROPANEDIOL 10 MG PO TABS
10.0000 mg | ORAL_TABLET | Freq: Every day | ORAL | Status: DC
  Administered 2022-06-14 – 2022-06-15 (×2): 10 mg via ORAL
  Filled 2022-06-13 (×2): qty 1

## 2022-06-13 MED ORDER — ONDANSETRON HCL 4 MG PO TABS: 4.0000 mg | ORAL_TABLET | Freq: Four times a day (QID) | ORAL | Status: DC | PRN

## 2022-06-13 MED ORDER — METHADONE HCL 10 MG PO TABS
70.0000 mg | ORAL_TABLET | Freq: Every day | ORAL | Status: DC
  Administered 2022-06-14: 70 mg via ORAL
  Filled 2022-06-13: qty 7

## 2022-06-13 MED ORDER — SERTRALINE HCL 100 MG PO TABS
100.0000 mg | ORAL_TABLET | Freq: Every day | ORAL | Status: DC
  Administered 2022-06-13 – 2022-06-15 (×3): 100 mg via ORAL
  Filled 2022-06-13 (×3): qty 1

## 2022-06-13 MED ORDER — METFORMIN HCL 500 MG PO TABS
1000.0000 mg | ORAL_TABLET | Freq: Two times a day (BID) | ORAL | Status: DC
  Administered 2022-06-13 – 2022-06-15 (×4): 1000 mg via ORAL
  Filled 2022-06-13 (×4): qty 2

## 2022-06-13 MED ORDER — SENNOSIDES-DOCUSATE SODIUM 8.6-50 MG PO TABS: 1.0000 | ORAL_TABLET | Freq: Every evening | ORAL | Status: DC | PRN

## 2022-06-13 MED ORDER — ACETAMINOPHEN 325 MG PO TABS
650.0000 mg | ORAL_TABLET | Freq: Four times a day (QID) | ORAL | Status: DC | PRN
  Administered 2022-06-15: 650 mg via ORAL
  Filled 2022-06-13: qty 2

## 2022-06-13 NOTE — H&P (Cosign Needed Addendum)
Date: 06/13/2022               Patient Name:  Timothy [name] MRN: [id number]  DOB: February 26, 1971 Age / Sex: 52 y.o., male   PCP: Charlott Rakes, MD         Medical Service: Internal Medicine Teaching Service         Attending Physician: Dr. Lottie Mussel, MD    First Contact: Dr. Dr. [name], MD  Pager: [phone]  Second Contact: Dr. Delene Ruffini, MD Pager: [phone]       After Hours (After 5p/  First Contact Pager: [phone]  weekends / holidays): Second Contact Pager: [phone]   Chief Complaint: Generalized weakness/dyspnea on exertion  History of Present Illness:  Timothy [name] is a 52 year old with PMH of polysubstance abuse, diabetes, HTN, HLD, GERD and depression who presented to the ER via EMS for generalized weakness and dyspnea on exertion.  States his symptoms started with a cough and some headache around New Year's Eve and his home COVID test was positive.  He stayed at home and took some Tylenol however declined to have progressive weakness since then.  Over the timeframe, he has had progressive dyspnea on exertion.  Yesterday his weakness worsened to the point where he has difficulty moving around his house.  He reports a productive cough of yellow sputum, 1 episode of watery stool yesterday, fatigue, muscle aches, heartburn and headaches but denies any fevers, chills, chest pain, abdominal pain, dysuria or nausea/vomiting. He denies any sick contacts.  ED course: Found to have SpO2 80% on room air at home by EMS who placed on 4 L .  Labs significant for leukopenia, WBC 2.6, negative UA, negative COVID and flu, lactic acid 1.2.  Checks x-ray showed multifocal pneumonia.  Patient was placed on O2 supplementation and started on Rocephin and azithromycin. IMTS consulted for admission.  Meds:  No outpatient medications have been marked as taking for the 06/13/22 encounter [facility] Encounter).  Methadone 70 mg daily Atorvastatin 20 mg daily Xarelto 20 mg  daily Amlodipine 2.5 mg daily Sertraline 100 mg daily Farxiga 10 mg daily Metformin 1000 mg twice daily Gabapentin 300 mg 2 times daily  Allergies: Allergies as of 06/13/2022 - Review Complete 06/13/2022  Allergen Reaction Noted   Beef-derived products Anaphylaxis 02/17/2021   Ioxaglate Shortness Of Breath and Other (See Comments) 07/16/2014   Ivp dye [iodinated contrast media] Shortness Of Breath and Other (See Comments) 11/22/2011   Pork-derived products Anaphylaxis 10/31/2020   Shellfish allergy  04/17/2018   Past Medical History:  Diagnosis Date   Carpal tunnel syndrome on both sides 03/28/2013   Chronic headache    Diabetes mellitus without complication (HCC)    DVT (deep venous thrombosis) ([place])    2017-2018: 4 dvts   GERD (gastroesophageal reflux disease)    Helicobacter pylori gastritis 06/30/2006   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   Pulmonary embolism ([place])    2013: 2 total   TIA (transient ischemic attack)     Family History: Father and mother with Alzheimer's and diabetes.  Heart disease in father.  Social History: Lives at home with both parents who helps take care of.  Currently on disability.  Independent with ADLs.  Reports history of heroin use, last used 2 years ago, currently on methadone therapy with Timothy [name].  Denies tobacco or EtOH use.  Review of Systems: A complete ROS was negative except as per HPI.   Physical Exam: Blood pressure 128/80, pulse 75,  temperature 98.6 F (37 C), temperature source Oral, resp. rate 11, SpO2 90 %. On 2 LNC.   General: Pleasant, well-appearing tall middle-age man laying in bed. No acute distress. HEENT: Dry mucous membrane. Missing all teeth except 2 upper incisors.  CV: RRR. No murmurs, rubs, or gallops. No LE edema Pulmonary: On 2 L [place]. No increased work of breathing. Rhonchi and distant crackles at the bases and midlung fields.  No wheezing. Abdominal: Soft, nontender, nondistended. Normal bowel  sounds. Extremities: Radial and DP pulses 2+ and symmetric. Skin: Warm and dry. No obvious rash or lesions. Decreased skin turgor. Neuro: A&Ox3. Moves all extremities. Normal sensation. No focal deficit. Psych: Normal mood and affect  EKG: personally reviewed my interpretation is normal sinus rhythm, unchanged compared to EKG on 01/2021.  CXR: personally reviewed my interpretation is patchy lung opacities in both mid and lower lung fields concerning for multifocal pneumonia.  Assessment & Plan by Problem: Principal Problem:   Community acquired pneumonia  Timothy [name] is a 52 year old with PMH of polysubstance abuse on methadone, pulmonary embolism, T2DM, HTN, HLD, GERD and depression who presented to the ER via EMS for generalized weakness and dyspnea on exertion and admitted for community-acquired pneumonia.  #Community-acquired pneumonia Patient with reported positive home COVID test 1 week ago presenting with dyspnea on exertion, productive cough, hypoxia and generalized weakness. CBC shows leukopenia with neutrophil predominance but stable hemoglobin. Chest x-ray shows multifocal pneumonia.  No clinical, radiological or imaging findings to indicate heart failure. Low concern for PE as patient is compliant with his Xarelto and not tachycardic. Flu and COVID test negative.  Will continue treatment for community-acquired pneumonia and wean oxygen as tolerated. -Continue Rocephin 1 g daily for 5 days, de-escalate to po prior to d/c -Azithromycin 500 mg for 3 days -Continue O2 supplementation, wean for O2 goal >92% -PRN Albuterol nebs -1 L IV LR bolus -F/u sputum cultures -Trend CBC, fever curve  #T2DM Well-controlled on metformin and Farxiga.  Last A1c 6.8% 2 months ago. -Continue metformin 1000 mg BID -Continue Farxiga 10 mg daily -Continue gabapentin 300 mg TID -SSI with meals, CBG checks  #Hx of pulmonary embolism/DVT Had a history of multiple PEs and DVTs his last DVT in September  2022 due to being off his anticoagulation. He remains on lifelong anticoagulation with Xarelto and endorses compliance with this. He denies any leg pain but reports dyspnea on exertion.  No concern for PE at the moment however due to patient's history, will consider evaluating for PE if unable to wean off O2. -Continue Xarelto 20 mg daily  #HTN #HLD Last lipid panel 1 year ago showed LDL at goal at 81. Blood pressure soft with SBP in the 90s to 120s.  Patient is dry on exam. Will hold BP med today. S/p 1 L IVLR.  -Continue atorvastatin 20 mg daily -Check lipid panel -Hold amlodipine today  #Anxiety/depression -Continue home sertraline 100 mg daily  #GERD On Prilosec at home. -Start Protonix 40 mg daily   CODE STATUS: Full code DIET: Carb modified PPx: Xarelto  Dispo: Admit patient to Observation with expected length of stay less than 2 midnights.  Signed: Steffanie Rainwater, MD 06/13/2022, 2:30 PM  Pager: [phone] Internal Medicine Teaching Service After 5pm on weekdays and 1pm on weekends: On Call pager: [phone]
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
## 2022-06-13 NOTE — ED Provider Notes (Signed)
[facility] EMERGENCY DEPARTMENT Provider Note   CSN: [id number] Arrival date & time: 06/13/22  5621     History  Chief Complaint  Patient presents with   Weakness    Timothy [name] is a 52 y.o. male.  Patient here with cough and shortness of breath.  COVID diagnosed on home test about 9 to 10 days ago.  He has been having increasing weakness.  History of polysubstance abuse on methadone.  Denies any smoking history or asthma history.  With EMS his oxygen was in the low 80s.  He was placed on 2 L of oxygen with improvement.  He has noticed that when he is walking he is very easily short of breath the last day or 2 and was too weak to get up out of bed today.  He had a cough with some sputum production.  Denies any chest pain, nausea, vomiting, diarrhea.  The history is provided by the patient.       Home Medications Prior to Admission medications   Medication Sig Start Date End Date Taking? Authorizing Provider  amLODipine (NORVASC) 2.5 MG tablet Take 1 tablet (2.5 mg total) by mouth daily. 12/21/21   Charlott Rakes, MD  atorvastatin (LIPITOR) 20 MG tablet Take 1 tablet (20 mg total) by mouth daily. 10/19/21   Charlott Rakes, MD  Blood Glucose Monitoring Suppl (TRUE METRIX METER) w/Device KIT use to test 3 times daily before meals 10/19/21   Charlott Rakes, MD  cephALEXin (KEFLEX) 500 MG capsule Take 1 capsule (500 mg total) by mouth 4 (four) times daily. Patient not taking: Reported on 04/12/2022 02/01/22   Regan Lemming, MD  dapagliflozin propanediol (FARXIGA) 10 MG TABS tablet Take 1 tablet (10 mg total) by mouth daily before breakfast. 10/19/21   Charlott Rakes, MD  doxycycline (VIBRA-TABS) 100 MG tablet Take 1 tablet (100 mg total) by mouth 2 (two) times daily for 5 days. Patient not taking: Reported on 04/12/2022 02/01/22   Regan Lemming, MD  DULoxetine (CYMBALTA) 60 MG capsule Take 1 capsule (60 mg total) by mouth daily. 04/12/22   Charlott Rakes, MD   EPINEPHrine (EPIPEN 2-PAK) 0.3 mg/0.3 mL IJ SOAJ injection Inject 0.3 mLs (0.3 mg total) into the muscle once as needed for up to 1 dose (for severe allergic reaction). CAll 911 immediately if you have to use this medicine 04/01/19   Kozlow, Donnamarie Poag, MD  gabapentin (NEURONTIN) 300 MG capsule Take 2 capsules (600 mg total) by mouth 3 (three) times daily. 03/22/22   Charlott Rakes, MD  glucose blood (TRUE METRIX BLOOD GLUCOSE TEST) test strip Use 3 times daily 10/19/21   Charlott Rakes, MD  metFORMIN (GLUCOPHAGE) 1000 MG tablet Take 1 tablet (1,000 mg total) by mouth 2 (two) times daily with a meal. 10/19/21   Charlott Rakes, MD  methadone (DOLOPHINE) 10 MG/ML solution Take 45 mg by mouth daily.     [provider]  mupirocin ointment (BACTROBAN) 2 % Apply to inner nose 2-3 times daily for 5 days. 08/04/21   Vanessa Kick, MD  omeprazole (PRILOSEC) 40 MG capsule Take 1 capsule (40 mg total) by mouth daily. 01/26/22   Charlott Rakes, MD  predniSONE (DELTASONE) 20 MG tablet Take 1 tablet (20 mg total) by mouth daily with breakfast. 04/12/22   Charlott Rakes, MD  rivaroxaban (XARELTO) 20 MG TABS tablet Take 1 tablet (20 mg total) by mouth daily with supper. 10/19/21   Charlott Rakes, MD  Semaglutide,0.25 or 0.5MG /DOS, (OZEMPIC, 0.25 OR  0.5 MG/DOSE,) 2 MG/3ML SOPN Inject 0.25 mg into the skin once a week. 12/21/21   Hoy Register, MD  TRUEplus Lancets 28G MISC use to test blood sugar 3 (three) times daily before meals. 10/19/21   Hoy Register, MD      Allergies    Beef-derived products, Ioxaglate, Ivp dye [iodinated contrast media], Pork-derived products, and Shellfish allergy    Review of Systems   Review of Systems  Physical Exam Updated Vital Signs BP 116/69   Pulse 83   Temp 98.6 F (37 C) (Oral)   Resp 18   SpO2 91%  Physical Exam Vitals and nursing note reviewed.  Constitutional:      General: He is not in acute distress.    Appearance: He is well-developed. He is not  ill-appearing.  HENT:     Head: Normocephalic and atraumatic.     Nose: Nose normal.     Mouth/Throat:     Mouth: Mucous membranes are moist.  Eyes:     Extraocular Movements: Extraocular movements intact.     Conjunctiva/sclera: Conjunctivae normal.     Pupils: Pupils are equal, round, and reactive to light.  Cardiovascular:     Rate and Rhythm: Normal rate and regular rhythm.     Pulses: Normal pulses.     Heart sounds: Normal heart sounds. No murmur heard. Pulmonary:     Effort: No respiratory distress.     Breath sounds: Rhonchi present.  Abdominal:     Palpations: Abdomen is soft.     Tenderness: There is no abdominal tenderness.  Musculoskeletal:        General: No swelling.     Cervical back: Normal range of motion and neck supple.  Skin:    General: Skin is warm and dry.     Capillary Refill: Capillary refill takes less than 2 seconds.  Neurological:     Mental Status: He is alert.  Psychiatric:        Mood and Affect: Mood normal.     ED Results / Procedures / Treatments   Labs (all labs ordered are listed, but only abnormal results are displayed) Labs Reviewed  COMPREHENSIVE METABOLIC PANEL - Abnormal; Notable for the following components:      Result Value   Glucose, Bld 168 (*)    BUN 5 (*)    Calcium 8.7 (*)    Albumin 3.1 (*)    AST 42 (*)    All other components within normal limits  CBC WITH DIFFERENTIAL/PLATELET - Abnormal; Notable for the following components:   WBC 2.6 (*)    Hemoglobin 12.9 (*)    Platelets 108 (*)    Lymphs Abs 0.6 (*)    All other components within normal limits  URINALYSIS, ROUTINE W REFLEX MICROSCOPIC - Abnormal; Notable for the following components:   Protein, ur 30 (*)    All other components within normal limits  RESP PANEL BY RT-PCR (RSV, FLU A&B, COVID)  [id number]  CULTURE, BLOOD (ROUTINE X 2)  CULTURE, BLOOD (ROUTINE X 2)  LACTIC ACID, PLASMA  BRAIN NATRIURETIC PEPTIDE  LACTIC ACID, PLASMA  TROPONIN I (HIGH  SENSITIVITY)  TROPONIN I (HIGH SENSITIVITY)    EKG EKG Interpretation  Date/Time:  Monday June 13 2022 10:22:45 EST Ventricular Rate:  84 PR Interval:  162 QRS Duration: 76 QT Interval:  362 QTC Calculation: 427 R Axis:   -11 Text Interpretation: Normal sinus rhythm Confirmed by Virgina Norfolk ([id number]) on 06/13/2022 12:15:59 PM  Radiology DG Chest  1 View  Result Date: 06/13/2022 CLINICAL DATA:  Shortness of breath. Chills. Cough for several days EXAM: CHEST  1 VIEW COMPARISON:  01/22/2021 FINDINGS: Bilateral lower lobe patchy parenchymal opacities greater than midlung zones. Acute infiltrates possible. No pneumothorax, effusion or edema. Normal cardiopericardial silhouette. Degenerative changes of the spine. IMPRESSION: Developing bilateral patchy parenchymal lung opacities, lower lung zones greater than mid. Multifocal pneumonia is possible recommend follow-up to confirm clearance. Electronically Signed   By: Karen Kays M.D.   On: 06/13/2022 11:13    Procedures .Critical Care  Performed by: Virgina Norfolk, DO Authorized by: Virgina Norfolk, DO   Critical care provider statement:    Critical care time (minutes):  35   Critical care was necessary to treat or prevent imminent or life-threatening deterioration of the following conditions:  Respiratory failure   Critical care was time spent personally by me on the following activities:  Blood draw for specimens, development of treatment plan with patient or surrogate, evaluation of patient's response to treatment, examination of patient, discussions with primary provider, obtaining history from patient or surrogate, ordering and performing treatments and interventions, ordering and review of laboratory studies, ordering and review of radiographic studies, pulse oximetry, re-evaluation of patient's condition and review of old charts   I assumed direction of critical care for this patient from another provider in my specialty: no        Medications Ordered in ED Medications  cefTRIAXone (ROCEPHIN) 1 g in sodium chloride 0.9 % 100 mL IVPB (has no administration in time range)  azithromycin (ZITHROMAX) 500 mg in sodium chloride 0.9 % 250 mL IVPB (has no administration in time range)  methadone (DOLOPHINE) tablet 70 mg (70 mg Oral Given 06/13/22 1036)    ED Course/ Medical Decision Making/ A&P                           Medical Decision Making Amount and/or Complexity of Data Reviewed Labs: ordered.  Risk Decision regarding hospitalization.   Timothy [name] is here with cough, weakness.  Normal vitals except for hypoxia in the mid 80s that improved on 2 L of oxygen.  Home COVID-positive test about 9 to 10 days ago.  History of opioid addiction on methadone.  Little bit of coarse breath sounds on exam but no signs of major respiratory distress.  Concern for postviral pneumonia on my differential seems less likely to be ACS.  Pneumonia would make more likely since then PE.  Will start with CBC, CMP, lactic acid, blood cultures, chest x-ray.  Chest x-ray per my review and interpretation appears consistent with pneumonia.  He is got some viral suppression of his white count I suspect with a white count of 2.8 but otherwise blood work is unremarkable.  Lactic acid is normal.  Overall I suspect hypoxic respiratory failure in the setting of viral pneumonia.  Will cover with Rocephin and a Zithromax.  Methadone was given as patient is due for his dose today.  Overall have low suspicion for PE at this time and can continue treatment for postviral pneumonia.  Admitted to medicine for further care.  This chart was dictated using voice recognition software.  Despite best efforts to proofread,  errors can occur which can change the documentation meaning.         Final Clinical Impression(s) / ED Diagnoses Final diagnoses:  Acute respiratory failure with hypoxia (HCC)  COVID-19  Community acquired pneumonia, unspecified  laterality  Rx / DC Orders ED Discharge Orders     None         Virgina Norfolk, DO 06/13/22 1217

## 2022-06-13 NOTE — ED Notes (Addendum)
Patient diaphoretic, requesting methadone. States he takes it daily but missed yesterday, states last dose was Saturday (06/11/2022).

## 2022-06-13 NOTE — ED Triage Notes (Signed)
Patient BIB [facility] from home for evaluation of generalized weakness, emesis, and diarrhea. Patient reports testing positive for COVID last Monday. [facility] reports SpO2 80% on room air, placed on 4L O2 [place], SpO2 86% Patient is alert, oriented, and in no apparent distress at this time.  BP 172/94 RR 20 HR 83

## 2022-06-13 NOTE — ED Notes (Signed)
ED TO INPATIENT HANDOFF REPORT  ED Nurse Name and Phone #: Dwana Melena [phone]  S Name/Age/Gender Timothy [name] 52 y.o. male Room/Bed: 011C/011C  Code Status   Code Status: Full Code  Home/SNF/Other Home Patient oriented to: self, place, time, and situation Is this baseline? Yes    Triage Complete: Triage complete  Chief Complaint Community acquired pneumonia [J18.9]  Triage Note Patient BIB [facility] from home for evaluation of generalized weakness, emesis, and diarrhea. Patient reports testing positive for COVID last Monday. [facility] reports SpO2 80% on room air, placed on 4L O2 [place], SpO2 86% Patient is alert, oriented, and in no apparent distress at this time.  BP 172/94 RR 20 HR 83    Allergies Allergies  Allergen Reactions   Beef-Derived Products Anaphylaxis    Tick bite    Ioxaglate Shortness Of Breath and Other (See Comments)    dizziness   Ivp Dye [Iodinated Contrast Media] Shortness Of Breath and Other (See Comments)    dizziness   Pork-Derived Products Anaphylaxis    From Tick bite   Shellfish Allergy     Level of Care/Admitting Diagnosis ED Disposition     ED Disposition  Admit   Condition  --   Comment  Hospital Area: [facility] [[id number]]  Level of Care: Telemetry Medical [104]  May place patient in observation at [facility] or [facility] if equivalent level of care is available:: No  Covid Evaluation: Symptomatic Person Under Investigation (PUI) or recent exposure (last 10 days) *Testing Required*  Diagnosis: Community acquired pneumonia [350093]  Admitting Physician: Mercie Eon [[id number]]  Attending Physician: Mercie Eon [[id number]]          B Medical/Surgery History Past Medical History:  Diagnosis Date   Carpal tunnel syndrome on both sides 03/28/2013   Chronic headache    Diabetes mellitus without complication (HCC)    DVT (deep venous thrombosis) (HCC)    2017-2018: 4 dvts   GERD (gastroesophageal reflux disease)     Helicobacter pylori gastritis 06/30/2006   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   Pulmonary embolism (HCC)    2013: 2 total   TIA (transient ischemic attack)    Past Surgical History:  Procedure Laterality Date   KNEE SURGERY Right 10/2016     A IV Location/Drains/Wounds Patient Lines/Drains/Airways Status     Active Line/Drains/Airways     Name Placement date Placement time Site Days   Peripheral IV 06/13/22 20 G Anterior;Right;Upper Arm 06/13/22  1212  Arm  less than 1            Intake/Output Last 24 hours No intake or output data in the 24 hours ending 06/13/22 1443  Labs/Imaging Results for orders placed or performed during the hospital encounter of 06/13/22 (from the past 48 hour(s))  Lactic acid, plasma     Status: None   Collection Time: 06/13/22 10:15 AM  Result Value Ref Range   Lactic Acid, Venous 1.2 0.5 - 1.9 mmol/L    Comment: Performed at [facility], [address], [place], [place] [address]  Comprehensive metabolic panel     Status: Abnormal   Collection Time: 06/13/22 10:15 AM  Result Value Ref Range   Sodium 137 135 - 145 mmol/L   Potassium 4.0 3.5 - 5.1 mmol/L   Chloride 98 98 - 111 mmol/L   CO2 27 22 - 32 mmol/L   Glucose, Bld 168 (H) 70 - 99 mg/dL    Comment: Glucose reference range applies  only to samples taken after fasting for at least 8 hours.   BUN 5 (L) 6 - 20 mg/dL   Creatinine, Ser 0.96 0.61 - 1.24 mg/dL   Calcium 8.7 (L) 8.9 - 10.3 mg/dL   Total Protein 6.6 6.5 - 8.1 g/dL   Albumin 3.1 (L) 3.5 - 5.0 g/dL   AST 42 (H) 15 - 41 U/L   ALT 24 0 - 44 U/L   Alkaline Phosphatase 116 38 - 126 U/L   Total Bilirubin 0.4 0.3 - 1.2 mg/dL   GFR, Estimated >28 >36 mL/min    Comment: (NOTE) Calculated using the CKD-EPI Creatinine Equation (2021)    Anion gap 12 5 - 15    Comment: Performed at [facility], [address], [place], [place] [address]  CBC with Differential     Status: Abnormal   Collection Time: 06/13/22  10:15 AM  Result Value Ref Range   WBC 2.6 (L) 4.0 - 10.5 K/uL   RBC 4.46 4.22 - 5.81 MIL/uL   Hemoglobin 12.9 (L) 13.0 - 17.0 g/dL   HCT 65.4 65.0 - 35.4 %   MCV 89.2 80.0 - 100.0 fL   MCH 28.9 26.0 - 34.0 pg   MCHC 32.4 30.0 - 36.0 g/dL   RDW 65.6 81.2 - 75.1 %   Platelets 108 (L) 150 - 400 K/uL    Comment: REPEATED TO VERIFY   nRBC 0.0 0.0 - 0.2 %   Neutrophils Relative % 69 %   Neutro Abs 1.8 1.7 - 7.7 K/uL   Lymphocytes Relative 24 %   Lymphs Abs 0.6 (L) 0.7 - 4.0 K/uL   Monocytes Relative 7 %   Monocytes Absolute 0.2 0.1 - 1.0 K/uL   Eosinophils Relative 0 %   Eosinophils Absolute 0.0 0.0 - 0.5 K/uL   Basophils Relative 0 %   Basophils Absolute 0.0 0.0 - 0.1 K/uL   Immature Granulocytes 0 %   Abs Immature Granulocytes 0.01 0.00 - 0.07 K/uL    Comment: Performed at [facility], [address], [place], [place] [address]  Troponin I (High Sensitivity)     Status: None   Collection Time: 06/13/22 10:15 AM  Result Value Ref Range   Troponin I (High Sensitivity) 5 <18 ng/L    Comment: (NOTE) Elevated high sensitivity troponin I (hsTnI) values and significant  changes across serial measurements may suggest ACS but many other  chronic and acute conditions are known to elevate hsTnI results.  Refer to the "Links" section for chest pain algorithms and additional  guidance. Performed at [facility], [address], [place], [place] [address]   Brain natriuretic peptide     Status: None   Collection Time: 06/13/22 10:15 AM  Result Value Ref Range   B Natriuretic Peptide 26.5 0.0 - 100.0 pg/mL    Comment: Performed at [facility], [address], [place], [place] [address]  Resp panel by RT-PCR (RSV, Flu A&B, Covid) Anterior Nasal Swab     Status: None   Collection Time: 06/13/22 10:21 AM   Specimen: Anterior Nasal Swab  Result Value Ref Range   SARS Coronavirus 2 by RT PCR NEGATIVE NEGATIVE    Comment: (NOTE) SARS-CoV-2 target nucleic acids are NOT  DETECTED.  The SARS-CoV-2 RNA is generally detectable in upper respiratory specimens during the acute phase of infection. The lowest concentration of SARS-CoV-2 viral copies this assay can detect is 138 copies/mL. A negative result does not preclude  SARS-Cov-2 infection and should not be used as the sole basis for treatment or other patient management decisions. A negative result may occur with  improper specimen collection/handling, submission of specimen other than nasopharyngeal swab, presence of viral mutation(s) within the areas targeted by this assay, and inadequate number of viral copies(<138 copies/mL). A negative result must be combined with clinical observations, patient history, and epidemiological information. The expected result is Negative.  Fact Sheet for Patients:  BloggerCourse.com  Fact Sheet for Healthcare Providers:  SeriousBroker.it  This test is no t yet approved or cleared by the [place] FDA and  has been authorized for detection and/or diagnosis of SARS-CoV-2 by FDA under an Emergency Use Authorization (EUA). This EUA will remain  in effect (meaning this test can be used) for the duration of the COVID-19 declaration under Section 564(b)(1) of the Act, 21 U.S.C.section 360bbb-3(b)(1), unless the authorization is terminated  or revoked sooner.       Influenza A by PCR NEGATIVE NEGATIVE   Influenza B by PCR NEGATIVE NEGATIVE    Comment: (NOTE) The Xpert Xpress SARS-CoV-2/FLU/RSV plus assay is intended as an aid in the diagnosis of influenza from Nasopharyngeal swab specimens and should not be used as a sole basis for treatment. Nasal washings and aspirates are unacceptable for Xpert Xpress SARS-CoV-2/FLU/RSV testing.  Fact Sheet for Patients: BloggerCourse.com  Fact Sheet for Healthcare Providers: SeriousBroker.it  This test is not yet approved or  cleared by the [place] FDA and has been authorized for detection and/or diagnosis of SARS-CoV-2 by FDA under an Emergency Use Authorization (EUA). This EUA will remain in effect (meaning this test can be used) for the duration of the COVID-19 declaration under Section 564(b)(1) of the Act, 21 U.S.C. section 360bbb-3(b)(1), unless the authorization is terminated or revoked.     Resp Syncytial Virus by PCR NEGATIVE NEGATIVE    Comment: (NOTE) Fact Sheet for Patients: BloggerCourse.com  Fact Sheet for Healthcare Providers: SeriousBroker.it  This test is not yet approved or cleared by the [place] FDA and has been authorized for detection and/or diagnosis of SARS-CoV-2 by FDA under an Emergency Use Authorization (EUA). This EUA will remain in effect (meaning this test can be used) for the duration of the COVID-19 declaration under Section 564(b)(1) of the Act, 21 U.S.C. section 360bbb-3(b)(1), unless the authorization is terminated or revoked.  Performed at [facility], [address] [place], [place] [address]   Urinalysis, Routine w reflex microscopic     Status: Abnormal   Collection Time: 06/13/22 10:32 AM  Result Value Ref Range   Color, Urine YELLOW YELLOW   APPearance CLEAR CLEAR   Specific Gravity, Urine 1.012 1.005 - 1.030   pH 8.0 5.0 - 8.0   Glucose, UA NEGATIVE NEGATIVE mg/dL   Hgb urine dipstick NEGATIVE NEGATIVE   Bilirubin Urine NEGATIVE NEGATIVE   Ketones, ur NEGATIVE NEGATIVE mg/dL   Protein, ur 30 (A) NEGATIVE mg/dL   Nitrite NEGATIVE NEGATIVE   Leukocytes,Ua NEGATIVE NEGATIVE   RBC / HPF 0-5 0 - 5 RBC/hpf   WBC, UA 0-5 0 - 5 WBC/hpf   Bacteria, UA NONE SEEN NONE SEEN   Squamous Epithelial / HPF 0-5 0 - 5 /HPF    Comment: Performed at [facility], [address], [place], [place] [address]  Lactic acid, plasma     Status: None   Collection Time: 06/13/22 12:27 PM  Result Value Ref  Range   Lactic Acid, Venous 0.8 0.5 -  1.9 mmol/L    Comment: Performed at [facility], [place] [address] [place],  [address]   DG Chest 1 View  Result Date: 06/13/2022 CLINICAL DATA:  Shortness of breath. Chills. Cough for several days EXAM: CHEST  1 VIEW COMPARISON:  01/22/2021 FINDINGS: Bilateral lower lobe patchy parenchymal opacities greater than midlung zones. Acute infiltrates possible. No pneumothorax, effusion or edema. Normal cardiopericardial silhouette. Degenerative changes of the spine. IMPRESSION: Developing bilateral patchy parenchymal lung opacities, lower lung zones greater than mid. Multifocal pneumonia is possible recommend follow-up to confirm clearance. Electronically Signed   By: Jill Side M.D.   On: 06/13/2022 11:13    Pending Labs Unresulted Labs (From admission, onward)     Start     Ordered   06/13/22 1326  Hemoglobin A1c  Once,   R       Comments: To assess prior glycemic control    06/13/22 1325   06/13/22 1324  Expectorated Sputum Assessment w Gram Stain, Rflx to Resp Cult  Once,   R        06/13/22 1324   06/13/22 1319  HIV Antibody (routine testing w rflx)  (HIV Antibody (Routine testing w reflex) panel)  Once,   R        06/13/22 1324   06/13/22 1155  Blood culture (routine x 2)  BLOOD CULTURE X 2,   R      06/13/22 1154            Vitals/Pain Today's Vitals   06/13/22 1215 06/13/22 1345 06/13/22 1415 06/13/22 1417  BP: 121/73 128/74 128/80   Pulse: 82 81 78 75  Resp: 14 (!) 22 17 11   Temp:      TempSrc:      SpO2: 92% 92% (!) 89% 90%  PainSc:        Isolation Precautions No active isolations  Medications Medications  acetaminophen (TYLENOL) tablet 650 mg (has no administration in time range)    Or  acetaminophen (TYLENOL) suppository 650 mg (has no administration in time range)  senna-docusate (Senokot-S) tablet 1 tablet (has no administration in time range)  ondansetron (ZOFRAN) tablet 4 mg (has no administration in time  range)    Or  ondansetron (ZOFRAN) injection 4 mg (has no administration in time range)  insulin aspart (novoLOG) injection 0-15 Units (has no administration in time range)  atorvastatin (LIPITOR) tablet 20 mg (has no administration in time range)  gabapentin (NEURONTIN) capsule 600 mg (has no administration in time range)  metFORMIN (GLUCOPHAGE) tablet 1,000 mg (has no administration in time range)  pantoprazole (PROTONIX) EC tablet 40 mg (has no administration in time range)  rivaroxaban (XARELTO) tablet 20 mg (has no administration in time range)  sertraline (ZOLOFT) tablet 100 mg (has no administration in time range)  methadone (DOLOPHINE) tablet 70 mg (70 mg Oral Given 06/13/22 1036)  cefTRIAXone (ROCEPHIN) 1 g in sodium chloride 0.9 % 100 mL IVPB (0 g Intravenous Stopped 06/13/22 1250)  azithromycin (ZITHROMAX) 500 mg in sodium chloride 0.9 % 250 mL IVPB (500 mg Intravenous New Bag/Given 06/13/22 1252)  lactated ringers bolus 1,000 mL (1,000 mLs Intravenous New Bag/Given 06/13/22 1437)    Mobility walks with device Low fall risk   Focused Assessments    R Recommendations: See Admitting Provider Note  Report given to:   Additional Notes:

## 2022-06-13 NOTE — ED Notes (Signed)
Unable to obtain 2nd blood culture set.

## 2022-06-14 DIAGNOSIS — I10 Essential (primary) hypertension: Secondary | ICD-10-CM | POA: Diagnosis present

## 2022-06-14 DIAGNOSIS — Z87891 Personal history of nicotine dependence: Secondary | ICD-10-CM

## 2022-06-14 DIAGNOSIS — F1910 Other psychoactive substance abuse, uncomplicated: Secondary | ICD-10-CM | POA: Diagnosis present

## 2022-06-14 DIAGNOSIS — F32A Depression, unspecified: Secondary | ICD-10-CM | POA: Diagnosis present

## 2022-06-14 DIAGNOSIS — Q984 Klinefelter syndrome, unspecified: Secondary | ICD-10-CM | POA: Diagnosis not present

## 2022-06-14 DIAGNOSIS — Z91014 Allergy to mammalian meats: Secondary | ICD-10-CM | POA: Diagnosis not present

## 2022-06-14 DIAGNOSIS — Z91013 Allergy to seafood: Secondary | ICD-10-CM | POA: Diagnosis not present

## 2022-06-14 DIAGNOSIS — E1140 Type 2 diabetes mellitus with diabetic neuropathy, unspecified: Secondary | ICD-10-CM | POA: Diagnosis present

## 2022-06-14 DIAGNOSIS — Z8673 Personal history of transient ischemic attack (TIA), and cerebral infarction without residual deficits: Secondary | ICD-10-CM | POA: Diagnosis not present

## 2022-06-14 DIAGNOSIS — Z86718 Personal history of other venous thrombosis and embolism: Secondary | ICD-10-CM | POA: Diagnosis not present

## 2022-06-14 DIAGNOSIS — Z7901 Long term (current) use of anticoagulants: Secondary | ICD-10-CM | POA: Diagnosis not present

## 2022-06-14 DIAGNOSIS — J9601 Acute respiratory failure with hypoxia: Principal | ICD-10-CM

## 2022-06-14 DIAGNOSIS — Z79899 Other long term (current) drug therapy: Secondary | ICD-10-CM | POA: Diagnosis not present

## 2022-06-14 DIAGNOSIS — J189 Pneumonia, unspecified organism: Secondary | ICD-10-CM

## 2022-06-14 DIAGNOSIS — Z91041 Radiographic dye allergy status: Secondary | ICD-10-CM | POA: Diagnosis not present

## 2022-06-14 DIAGNOSIS — Z7984 Long term (current) use of oral hypoglycemic drugs: Secondary | ICD-10-CM | POA: Diagnosis not present

## 2022-06-14 DIAGNOSIS — E785 Hyperlipidemia, unspecified: Secondary | ICD-10-CM | POA: Diagnosis present

## 2022-06-14 DIAGNOSIS — Z86711 Personal history of pulmonary embolism: Secondary | ICD-10-CM | POA: Diagnosis not present

## 2022-06-14 DIAGNOSIS — K219 Gastro-esophageal reflux disease without esophagitis: Secondary | ICD-10-CM | POA: Diagnosis present

## 2022-06-14 DIAGNOSIS — Z1152 Encounter for screening for COVID-19: Secondary | ICD-10-CM | POA: Diagnosis not present

## 2022-06-14 DIAGNOSIS — E119 Type 2 diabetes mellitus without complications: Secondary | ICD-10-CM | POA: Diagnosis present

## 2022-06-14 DIAGNOSIS — Z820 Family history of epilepsy and other diseases of the nervous system: Secondary | ICD-10-CM | POA: Diagnosis not present

## 2022-06-14 DIAGNOSIS — Z8616 Personal history of COVID-19: Secondary | ICD-10-CM | POA: Diagnosis not present

## 2022-06-14 DIAGNOSIS — U071 COVID-19: Secondary | ICD-10-CM

## 2022-06-14 DIAGNOSIS — Z888 Allergy status to other drugs, medicaments and biological substances status: Secondary | ICD-10-CM | POA: Diagnosis not present

## 2022-06-14 DIAGNOSIS — Z833 Family history of diabetes mellitus: Secondary | ICD-10-CM | POA: Diagnosis not present

## 2022-06-14 LAB — CBC
HCT: 37.6 % — ABNORMAL LOW (ref 39.0–52.0)
Hemoglobin: 11.8 g/dL — ABNORMAL LOW (ref 13.0–17.0)
MCH: 28.4 pg (ref 26.0–34.0)
MCHC: 31.4 g/dL (ref 30.0–36.0)
MCV: 90.4 fL (ref 80.0–100.0)
Platelets: 118 10*3/uL — ABNORMAL LOW (ref 150–400)
RBC: 4.16 MIL/uL — ABNORMAL LOW (ref 4.22–5.81)
RDW: 15.4 % (ref 11.5–15.5)
WBC: 2.1 10*3/uL — ABNORMAL LOW (ref 4.0–10.5)
nRBC: 0 % (ref 0.0–0.2)

## 2022-06-14 LAB — GLUCOSE, CAPILLARY
Glucose-Capillary: 164 mg/dL — ABNORMAL HIGH (ref 70–99)
Glucose-Capillary: 131 mg/dL — ABNORMAL HIGH (ref 70–99)
Glucose-Capillary: 116 mg/dL — ABNORMAL HIGH (ref 70–99)
Glucose-Capillary: 157 mg/dL — ABNORMAL HIGH (ref 70–99)

## 2022-06-14 LAB — BASIC METABOLIC PANEL
Anion gap: 10 (ref 5–15)
BUN: 6 mg/dL (ref 6–20)
CO2: 28 mmol/L (ref 22–32)
Calcium: 8.6 mg/dL — ABNORMAL LOW (ref 8.9–10.3)
Chloride: 101 mmol/L (ref 98–111)
Creatinine, Ser: 0.59 mg/dL — ABNORMAL LOW (ref 0.61–1.24)
GFR, Estimated: >60 mL/min (ref >60)
Glucose, Bld: 178 mg/dL — ABNORMAL HIGH (ref 70–99)
Potassium: 3.9 mmol/L (ref 3.5–5.1)
Sodium: 139 mmol/L (ref 135–145)

## 2022-06-14 MED ORDER — ADULT MULTIVITAMIN W/MINERALS CH
1.0000 | ORAL_TABLET | Freq: Every day | ORAL | Status: DC
  Administered 2022-06-14 – 2022-06-15 (×2): 1 via ORAL
  Filled 2022-06-14 (×2): qty 1

## 2022-06-14 MED ORDER — ENSURE ENLIVE PO LIQD
237.0000 mL | Freq: Two times a day (BID) | ORAL | Status: DC
  Administered 2022-06-15: 237 mL via ORAL

## 2022-06-14 MED ORDER — METHADONE HCL 10 MG PO TABS
70.0000 mg | ORAL_TABLET | Freq: Every day | ORAL | Status: DC
  Administered 2022-06-15: 70 mg via ORAL
  Filled 2022-06-14: qty 7

## 2022-06-14 MED ORDER — DULOXETINE HCL 60 MG PO CPEP
60.0000 mg | ORAL_CAPSULE | Freq: Every day | ORAL | Status: DC
  Administered 2022-06-14 – 2022-06-15 (×2): 60 mg via ORAL
  Filled 2022-06-14 (×2): qty 1

## 2022-06-14 NOTE — Hospital Course (Addendum)
06/14/22 Patient is not feeling well. Patient thinks he is having some withdrawal symptoms. He feels cold and hot. Patient does not endorse shortness of breath. He declines any acute or pleuritic chest pain. No generalized pain.  Ate last night but no appetite this AM; continues to drink well.    He denies any bowel or bladder concerns. Patient endorses that he is not concerned about going home. Patient turned down to 2L of O2 in the room.   1/10: Patient reports that he is experiencing nausea this morning that is worsening. He states that the nausea medications are not helping. He reports that his cough has improved. Patient states that he has been able to get up and walk to the bathroom. He has had a bowel movement since admission. Discussed plan for ambulatory oxygen today. Discussed plan to continue with PO antibiotics upon discharge.  -Put in for mucinex   [name], [name] were admitted to the hospital because you were short of breath. You were diagnosed with pneumonia, treated with intravenous antibiotics, and supplemental oxygen while you were here. The blood cultures we took have not shown signs of blood infection. We tested your breathing while walking, and you won't need oxygen therapy to go home. You have improved significantly since you were admitted and are now stable for discharge.  You will need to finish the course of antibiotics started at the hospital. We are prescribing you with a medication called Cefdinir. Take one 300 mg tablet twice daily starting tomorrow, January 11 in the morning, until Friday January 12th at night. You can continue taking Mucinex, one tablet twice daily for the next week to help you with your cough and loosening the mucus.  You received 70 mg of Methadone daily while you were hospitalized. The last dose was today 06/15/2022 at 04:26 AM. I have called your clinic, [facility], and spoke with the nurse in charge of your care. They are aware you were with  [place] and that you received Methadone treatment.  Please make an appointment with with your primary care provider to follow after your discharge and make sure your breathing has improved after finishing the antibiotic course.  It was a pleasure caring for you, Timothy Juniper, MD

## 2022-06-14 NOTE — Progress Notes (Signed)
Initial Nutrition Assessment  DOCUMENTATION CODES:   Not applicable  INTERVENTION:  Ensure Enlive po BID, each supplement provides 350 kcal and 20 grams of protein. MVI with minerals daily Request updated measured weight  NUTRITION DIAGNOSIS:   Increased nutrient needs related to acute illness as evidenced by estimated needs.  GOAL:   Patient will meet greater than or equal to 90% of their needs  MONITOR:   PO intake, Supplement acceptance, Labs, Weight trends  REASON FOR ASSESSMENT:   Malnutrition Screening Tool    ASSESSMENT:   Timothy [name] admitted with CAP. PMH significant for polysubstance use disorder on methadone, prior PE, T2DM, HTN, GERD and depression. Food allergies: beef, alpha gal, pork, shellfish  Patient sitting in bed at time of visit. He states that he has been eating about 100% less that his usual intake within the last week d/t decreased appetite r/t CAP.  Noted lunch tray on bedside table which was uneaten at time of visit. He recalls a typical intake of about 4 meals daily. He states that he has lost most of his teeth overtime d/t history of drug use which has affected the types of foods he can eat. Timothy [name] states that he has lacked insurance until December 2023 which has prevented him from going to the dentist but now plans to follow up.   He reports having received 1 Ensure yesterday which he enjoyed and wishes to receive more during admission while his intake is less than his usual PO intake.   Timothy [name] recalls having weighed 290 lbs about 1 month ago and a weight loss of about 80 lbs within that time d/t his poor dentition. Given his history of chronic poor dentition, question whether weight loss is also attributed to other underlying factors.   Reviewed weight history. Last 3 documented weights since 01/2022 have been 117.9 kg. Suspect current documentation of weight is carried forward from prior weight rather than actual weight. Noted a weight loss of  10.3% between  10/19/21-02/01/22 which is clinically significant for that time frame. Will request updated measured weight to determine if Timothy [name] has had additional ongoing weight loss.   Medications: abx, farxiga, SSI 0-15 units TID, metformin, protonix, IV abx  Labs: HgbA1c 8.3%, CBG's 116, 131, 164  NUTRITION - FOCUSED PHYSICAL EXAM:  Flowsheet Row Most Recent Value  Orbital Region Moderate depletion  Upper Arm Region No depletion  Thoracic and Lumbar Region No depletion  Buccal Region Moderate depletion  Temple Region Moderate depletion  Clavicle Bone Region Mild depletion  Clavicle and Acromion Bone Region No depletion  Scapular Bone Region No depletion  Dorsal Hand No depletion  Patellar Region No depletion  Anterior Thigh Region No depletion  Posterior Calf Region No depletion  Edema (RD Assessment) None  Hair Reviewed  Eyes Reviewed  Mouth Other (Comment)  [poor dentition]  Skin Reviewed  Nails Reviewed       Diet Order:   Diet Order             Diet Carb Modified Fluid consistency: Thin; Room service appropriate? Yes  Diet effective now                   EDUCATION NEEDS:   No education needs have been identified at this time  Skin:  Skin Assessment: Reviewed RN Assessment  Last BM:  1/7  Height:   Ht Readings from Last 1 Encounters:  04/12/22 6\' 4"  (1.93 m)    Weight:   Wt Readings from Last 1  Encounters:  06/14/22 117.9 kg    Ideal Body Weight:  91.8 kg  BMI:  Body mass index is 31.65 kg/m.  Estimated Nutritional Needs:   Kcal:  2400-2600  Protein:  120-135g  Fluid:  >/=2L  Drusilla Kanner, RDN, LDN Clinical Nutrition

## 2022-06-14 NOTE — Progress Notes (Signed)
Hospital day#0 Subjective:   Summary: Timothy [name] is a 52yo M with a hx of polysubstance use disorder on methadone, prior PE, T2DM, HTN, HTN, GERD and depression who presented with dyspnea and is being treated for CAP  Overnight Events: 5L of O2 at 95%  Patient is not feeling well. Patient thinks he is having some withdrawal symptoms. He feels cold and hot. Patient does not endorse shortness of breath. He declines any acute or pleuritic chest pain. No generalized pain.  Ate last night but no appetite this AM; continues to drink well.   He denies any bowel or bladder concerns. Patient endorses that he is not concerned about going home. Patient turned down to 2L of O2 in the room.   Objective:  Vital signs in last 24 hours: Vitals:   06/13/22 2358 06/14/22 0350 06/14/22 0403 06/14/22 0540  BP: 125/72 130/75    Pulse: 72 76 67   Resp: 18 20 20    Temp: 99.7 F (37.6 C) 97.9 F (36.6 C)    TempSrc: Axillary Oral    SpO2: 95% (!) 88% 90% 95%   Supplemental O2: Nasal Cannula Patient was placed on 2L nasal cannula with 91-92% saturation  Physical Exam:  Constitutional:Chronically ill-appearing man sitting in bed, in no acute distress HENT: normocephalic atraumatic, moist mucous membranes Cardiovascular: regular rate and rhythm, no m/r/g Pulmonary/Chest: normal work of breathing on 2L nasal cannula, lungs clear to auscultation bilaterally. Mild wheezing Abdominal: Normal BS, soft, non-tender, non-distended. MSK: normal bulk and tone. No pitting edema Neurological: alert & oriented x 3, 5/5 strength in bilateral upper and lower extremities, normal gait Skin: warm and dry Psych: Pleasant mood and affect   Intake/Output Summary (Last 24 hours) at 06/14/2022 4734 Last data filed at 06/14/2022 0350 Gross per 24 hour  Intake 240 ml  Output 1000 ml  Net -760 ml   Net IO Since Admission: -760 mL [06/14/22 0608]  Pertinent Labs:    Latest Ref Rng & Units 06/13/2022   10:15 AM 02/01/2022     9:11 AM 01/31/2021    7:53 PM  CBC  WBC 4.0 - 10.5 K/uL 2.6  5.1  7.7   Hemoglobin 13.0 - 17.0 g/dL 12.9  12.9  12.7   Hematocrit 39.0 - 52.0 % 39.8  40.5  39.1   Platelets 150 - 400 K/uL 108  191  141        Latest Ref Rng & Units 06/13/2022   10:15 AM 02/01/2022    9:11 AM 10/19/2021    2:37 PM  CMP  Glucose 70 - 99 mg/dL 168  142  339   BUN 6 - 20 mg/dL 5  12  12    Creatinine 0.61 - 1.24 mg/dL 0.63  0.75  0.82   Sodium 135 - 145 mmol/L 137  144  141   Potassium 3.5 - 5.1 mmol/L 4.0  3.8  4.6   Chloride 98 - 111 mmol/L 98  106  102   CO2 22 - 32 mmol/L 27  28  21    Calcium 8.9 - 10.3 mg/dL 8.7  9.3  9.5   Total Protein 6.5 - 8.1 g/dL 6.6  7.1  7.4   Total Bilirubin 0.3 - 1.2 mg/dL 0.4  0.3  <0.2   Alkaline Phos 38 - 126 U/L 116  80  117   AST 15 - 41 U/L 42  17  30   ALT 0 - 44 U/L 24  17  29  Imaging: DG Chest 1 View  Result Date: 06/13/2022 CLINICAL DATA:  Shortness of breath. Chills. Cough for several days EXAM: CHEST  1 VIEW COMPARISON:  01/22/2021 FINDINGS: Bilateral lower lobe patchy parenchymal opacities greater than midlung zones. Acute infiltrates possible. No pneumothorax, effusion or edema. Normal cardiopericardial silhouette. Degenerative changes of the spine. IMPRESSION: Developing bilateral patchy parenchymal lung opacities, lower lung zones greater than mid. Multifocal pneumonia is possible recommend follow-up to confirm clearance. Electronically Signed   By: Karen Kays M.D.   On: 06/13/2022 11:13    Assessment/Plan:   Principal Problem:   Community acquired pneumonia   Patient Summary: Timothy [name] is a 52yo M with a hx of polysubstance use disorder on methadone, prior PE, T2DM, HTN, HTN, GERD and depression who presented with dyspnea and admitted for CAP, now on O2 and receiving antibiotic therapy  Community acquired pneumonia Patient s/p COVID-19 infection presented with dyspnea, weakness, found to be hypoxic. Leukopenic with neutrophil predominance  and CXR with multifocal pneumonia, currently being treated for CAP. COVID and flu negative on admission. HDS overnight, continues to use supplemental O2,down to 2-3L O2 at rest. Leukopenia is stable. No growth on blood cultures yet. Will continue therapy with CTX and azithromycin today and assess ambulatory pulse oximeter. Pending on culture availability and ambulatory pulse oximetry results, will plan switching to PO medications and discharge tomorrow. -Ceftriaxone 1g (1/8- -Azithromycin 500 mg (1/8-1/10) -F/u pulse oximetry  T2DM Diabetic neuropathy A1c 8.3 on admission -Continue Farxiga 10 mg daily -Continue Metformin 1000 mg BID -SSI with meals -Gabapentin 600 mg TID  Polysubstance use disorder Patient on methadone 70 mg daily from [place] [facility]. Received first dose at 10AM on admission day. Will change timing of Methadone to be administered at [facility] daily. Attempted to call Methadone clinic today, but it was closed. Hours from 5A-10A. Will attempt tomorrow.  -Continue methadone 70 mg daily  HTN HLD Home regimen amlodipine. Normotensive today -CTM  Hx of PE/DVT Prior hx, last DVT 02/2021 after being off anticoagulation, now compliant. No pleuritic chest pain today. Will continue to monitor respiratory status for improvement; low threshold for PE evaluation. -Continue Xarelto 20 mg  Anxiety Depression Continue home sertraline 100 mg daily  -Restarted home Duloxetine 60 mg daily 1/9  GERD -on Protonix 40 mg  Diet: Carb-Modified VTE: xarelto Code: Full  Dispo: Anticipated discharge to Home in 1-2 days pending clinical improvement.   Morene Crocker, MD Internal Medicine Resident PGY-1 Please contact the on call pager after 5 pm and on weekends at [phone].

## 2022-06-14 NOTE — Progress Notes (Signed)
RN called RT to bedside for PRN tx, Pt is stable and O2 stat is 95% on 5L [place]. Adv having pain with no issues of SOB. Will continue to monitor

## 2022-06-15 ENCOUNTER — Other Ambulatory Visit (HOSPITAL_COMMUNITY): Payer: Self-pay

## 2022-06-15 DIAGNOSIS — J189 Pneumonia, unspecified organism: Secondary | ICD-10-CM | POA: Diagnosis not present

## 2022-06-15 DIAGNOSIS — U071 COVID-19: Secondary | ICD-10-CM

## 2022-06-15 DIAGNOSIS — J9601 Acute respiratory failure with hypoxia: Secondary | ICD-10-CM | POA: Diagnosis not present

## 2022-06-15 DIAGNOSIS — Z87891 Personal history of nicotine dependence: Secondary | ICD-10-CM | POA: Diagnosis not present

## 2022-06-15 LAB — BASIC METABOLIC PANEL
Anion gap: 9 (ref 5–15)
BUN: 8 mg/dL (ref 6–20)
CO2: 25 mmol/L (ref 22–32)
Calcium: 8.7 mg/dL — ABNORMAL LOW (ref 8.9–10.3)
Chloride: 103 mmol/L (ref 98–111)
Creatinine, Ser: 0.6 mg/dL — ABNORMAL LOW (ref 0.61–1.24)
GFR, Estimated: >60 mL/min (ref >60)
Glucose, Bld: 110 mg/dL — ABNORMAL HIGH (ref 70–99)
Potassium: 4.1 mmol/L (ref 3.5–5.1)
Sodium: 137 mmol/L (ref 135–145)

## 2022-06-15 LAB — CBC
HCT: 38.6 % — ABNORMAL LOW (ref 39.0–52.0)
Hemoglobin: 12.5 g/dL — ABNORMAL LOW (ref 13.0–17.0)
MCH: 28.7 pg (ref 26.0–34.0)
MCHC: 32.4 g/dL (ref 30.0–36.0)
MCV: 88.7 fL (ref 80.0–100.0)
Platelets: 148 10*3/uL — ABNORMAL LOW (ref 150–400)
RBC: 4.35 MIL/uL (ref 4.22–5.81)
RDW: 15.2 % (ref 11.5–15.5)
WBC: 3 10*3/uL — ABNORMAL LOW (ref 4.0–10.5)
nRBC: 0 % (ref 0.0–0.2)

## 2022-06-15 LAB — MRSA NEXT GEN BY PCR, NASAL: MRSA by PCR Next Gen: DETECTED — AB

## 2022-06-15 LAB — GLUCOSE, CAPILLARY
Glucose-Capillary: 138 mg/dL — ABNORMAL HIGH (ref 70–99)
Glucose-Capillary: 127 mg/dL — ABNORMAL HIGH (ref 70–99)

## 2022-06-15 MED ORDER — GUAIFENESIN ER 600 MG PO TB12
600.0000 mg | ORAL_TABLET | Freq: Two times a day (BID) | ORAL | 2 refills | Status: DC
  Filled 2022-06-15: qty 60, 30d supply, fill #0
  Filled 2022-07-13 – 2022-07-17 (×2): qty 60, 30d supply, fill #1

## 2022-06-15 MED ORDER — CEFDINIR 300 MG PO CAPS
600.0000 mg | ORAL_CAPSULE | Freq: Every day | ORAL | 0 refills | Status: DC
  Filled 2022-06-15: qty 4, 2d supply, fill #0

## 2022-06-15 MED ORDER — GUAIFENESIN ER 600 MG PO TB12
600.0000 mg | ORAL_TABLET | Freq: Two times a day (BID) | ORAL | Status: DC
  Administered 2022-06-15: 600 mg via ORAL
  Filled 2022-06-15: qty 1

## 2022-06-15 NOTE — Progress Notes (Signed)
Discharge instructions reviewed with pt.   Copy of instructions and filled scripts by [place] given to pt. Questions answered.  Pt has called family/friend and will be coming to pick him up.   Pt to be d/c'd via wheelchair with belongings, with family/friend picking him up at the front entrance.          To be escorted by hospital volunteer.

## 2022-06-15 NOTE — Progress Notes (Signed)
Patient saturation at rest 96%.  IS given and educated on use.  Demonstrated knowledge on use.  Needs addressed.

## 2022-06-15 NOTE — Discharge Instructions (Signed)
Mr. Kiko, Ripp were admitted to the hospital because you were short of breath. You were diagnosed with pneumonia, treated with intravenous antibiotics, and supplemental oxygen while you were here. The blood cultures we took have not shown signs of blood infection. We tested your breathing while walking, and you won't need oxygen therapy to go home. You have improved significantly since you were admitted and are now stable for discharge.  You will need to finish the course of antibiotics started at the hospital. We are prescribing you with a medication called Cefdinir. Take one 300 mg tablet twice daily starting tomorrow, January 11 in the morning, until Friday January 12th at night. You can continue taking Mucinex, one tablet twice daily for the next week to help you with your cough and loosening the mucus.  You received 70 mg of Methadone daily while you were hospitalized. The last dose was today 06/15/2022 at 04:26 AM. I have called your clinic, [facility], and spoke with the nurse in charge of your care. They are aware you were with [place] and that you received Methadone treatment.  Please make an appointment with with your primary care provider to follow after your discharge and make sure your breathing has improved after finishing the antibiotic course.  It was a pleasure caring for you, [name], MD

## 2022-06-15 NOTE — Progress Notes (Signed)
Oxygen saturation at rest with O2 at [facility] = 97%, without O2 = 92% Oxygen saturation while walking with O2 at 3L = 95%, without O2 = 91% Patient was able to tolerate ambulating up to approximately 50 feet.  C/o of lightheadedness, dizziness, and "I cannot walk any further, I do not feel good".  Patient c/o nausea as well, started dry heaving on his way back to the chair.  Saturation stayed 91% on room air the whole time.  Assisted back in chair, needs addressed.  Kept oxygen off at this time.

## 2022-06-15 NOTE — Discharge Summary (Signed)
Name: Timothy [name] MRN: [id number] DOB: 06-06-1971 52 y.o. PCP: Hoy Register, MD  Date of Admission: 06/13/2022  9:26 AM Date of Discharge: 06/15/2022 6:46 PM Attending Physician: Dr.  Lafonda Mosses  Discharge Diagnosis: Principal Problem:   Community acquired pneumonia Active Problems:   Acute respiratory failure with hypoxia (HCC)   COVID-19    Discharge Medications: Allergies as of 06/15/2022       Reactions   Beef-derived Products Anaphylaxis   Alpha Gal.   Food Anaphylaxis   All mammalian meat - anaphylaxis (alpha gal)   Ioxaglate Shortness Of Breath, Other (See Comments)   Dizziness    Ivp Dye [iodinated Contrast Media] Shortness Of Breath, Other (See Comments)   Dizziness    Pork-derived Products Anaphylaxis   Alpha Gal.   Shellfish Allergy Swelling   Swelling of face/mouth. No airway restriction.        Medication List     STOP taking these medications    mupirocin ointment 2 % Commonly known as: BACTROBAN       TAKE these medications    amLODipine 2.5 MG tablet Commonly known as: NORVASC Take 1 tablet (2.5 mg total) by mouth daily. What changed: when to take this   atorvastatin 20 MG tablet Commonly known as: LIPITOR Take 1 tablet (20 mg total) by mouth daily. What changed: when to take this   cefdinir 300 MG capsule Commonly known as: OMNICEF Take 2 capsules (600 mg total) by mouth daily for 2 days.   DULoxetine 60 MG capsule Commonly known as: Cymbalta Take 1 capsule (60 mg total) by mouth daily. What changed: when to take this   EPINEPHrine 0.3 mg/0.3 mL Soaj injection Commonly known as: EpiPen 2-Pak Inject 0.3 mLs (0.3 mg total) into the muscle once as needed for up to 1 dose (for severe allergic reaction). CAll 911 immediately if you have to use this medicine   Farxiga 10 MG Tabs tablet Generic drug: dapagliflozin propanediol Take 1 tablet (10 mg total) by mouth daily before breakfast.   gabapentin 300 MG capsule Commonly known  as: NEURONTIN Take 2 capsules (600 mg total) by mouth 3 (three) times daily. Notes to patient: Take approx 8 hours apart    metFORMIN 1000 MG tablet Commonly known as: GLUCOPHAGE Take 1 tablet (1,000 mg total) by mouth 2 (two) times daily with a meal.   methadone 10 MG/ML solution Commonly known as: DOLOPHINE Take 70 mg by mouth daily.   Mucus Relief 600 MG 12 hr tablet Generic drug: guaiFENesin Take 1 tablet (600 mg total) by mouth 2 (two) times daily.   omeprazole 40 MG capsule Commonly known as: PRILOSEC Take 1 capsule (40 mg total) by mouth daily. What changed: when to take this   Ozempic (0.25 or 0.5 MG/DOSE) 2 MG/3ML Sopn Generic drug: Semaglutide(0.25 or 0.5MG /DOS) Inject 0.25 mg into the skin once a week. What changed:  when to take this additional instructions   predniSONE 20 MG tablet Commonly known as: DELTASONE Take 1 tablet (20 mg total) by mouth daily with breakfast.   sertraline 100 MG tablet Commonly known as: ZOLOFT Take 100 mg by mouth at bedtime.   True Metrix Blood Glucose Test test strip Generic drug: glucose blood Use 3 times daily   TRUEplus Lancets 28G Misc use to test blood sugar 3 (three) times daily before meals.   Xarelto 20 MG Tabs tablet Generic drug: rivaroxaban Take 1 tablet (20 mg total) by mouth daily with supper. What changed: when to  take this        Disposition and follow-up:   Timothy [name] was discharged from [facility] in Stable condition.  At the hospital follow up visit please address:  1.  Follow-up:  CAP - leukopenic during hospitalization. CXR with multifocal pneumonia. No O2 requirement on discharge. Discharged with cefdinir 2 more days   Polysubstance use disorder - on methadone 70 with [facility].   HTN - normotensive with amlodipine  Hx PE/DVT - stable on Xarelto   2.  Labs / imaging needed at time of follow-up: none  3.  Pending labs/ test needing follow-up: none  4.   Medication Changes   - cefdinir 2 days started   Follow-up Appointments:  Follow-up Information     Charlott Rakes, MD Follow up.   Specialty: Family Medicine Contact information: [address] [place] [address] [facility] Course by problem list: Patient presented to hospital with complaints of shortness of breath and fatigue. He had taken home covid test which was positive but COVID, FLU, RSV testing was negative here. CXR obtained on admission most consistent with multifocal pneumonia, likely related to recent viral illness.  He was started on antibiotics for CAP and supplemental oxygen. Initially required 5L but weaned down to 2-3 L by the following day. Patient clinically improved and ambulatory O2 sat was obtained. He did not require supplemental oxygen and subjectively felt better as well. He was transitioned to PO antibiotics on discharge and instructed to follow up.   Polysubstance use disorder - patient noted hx polysubstance use disorder and endorsed receiving methadone 70mg  daily from [facility]. He was restarted on this medication on admission with no signs of withdrawal noted throughout hospitalization. [facility] was contacted prior to discharge and confirmed patients admission to hospital and methadone Rx   Discharge Subjective: Feeling better. Not having as many aches or pains today. Does feel well enough to discharge home if oxygen level remains high with ambulation.   Discharge Exam:   Blood pressure 105/61, pulse 80, temperature 98.6 F (37 C), temperature source Oral, resp. rate 16, weight 119 kg, SpO2 96 %.  Constitutional: no acute distress Cardiovascular: regular rate and rhythm, no m/r/g Pulmonary/Chest: normal work of breathing on 2L nasal cannula, lungs clear to auscultation bilaterally. Abdominal: Normal BS, soft, non-tender, non-distended. MSK: normal bulk and tone. No pitting  edema Neurological: alert & oriented x 3 Skin: warm and dry Psych: Pleasant mood and affect  Pertinent Labs, Studies, and Procedures:     Latest Ref Rng & Units 06/15/2022    3:39 AM 06/14/2022    7:59 AM 06/13/2022   10:15 AM  CBC  WBC 4.0 - 10.5 K/uL 3.0  2.1  2.6   Hemoglobin 13.0 - 17.0 g/dL 12.5  11.8  12.9   Hematocrit 39.0 - 52.0 % 38.6  37.6  39.8   Platelets 150 - 400 K/uL 148  118  108        Latest Ref Rng & Units 06/15/2022    3:39 AM 06/14/2022    7:59 AM 06/13/2022   10:15 AM  CMP  Glucose 70 - 99 mg/dL 110  178  168   BUN 6 - 20 mg/dL 8  6  5    Creatinine 0.61 - 1.24 mg/dL 0.60  0.59  0.63   Sodium 135 - 145 mmol/L 137  139  137   Potassium 3.5 - 5.1 mmol/L 4.1  3.9  4.0   Chloride 98 - 111 mmol/L 103  101  98   CO2 22 - 32 mmol/L 25  28  27    Calcium 8.9 - 10.3 mg/dL 8.7  8.6  8.7   Total Protein 6.5 - 8.1 g/dL   6.6   Total Bilirubin 0.3 - 1.2 mg/dL   0.4   Alkaline Phos 38 - 126 U/L   116   AST 15 - 41 U/L   42   ALT 0 - 44 U/L   24     DG Chest 1 View  Result Date: 06/13/2022 CLINICAL DATA:  Shortness of breath. Chills. Cough for several days EXAM: CHEST  1 VIEW COMPARISON:  01/22/2021 FINDINGS: Bilateral lower lobe patchy parenchymal opacities greater than midlung zones. Acute infiltrates possible. No pneumothorax, effusion or edema. Normal cardiopericardial silhouette. Degenerative changes of the spine. IMPRESSION: Developing bilateral patchy parenchymal lung opacities, lower lung zones greater than mid. Multifocal pneumonia is possible recommend follow-up to confirm clearance. Electronically Signed   By: Jill Side M.D.   On: 06/13/2022 11:13     Discharge Instructions: Discharge Instructions     Call MD for:  difficulty breathing, headache or visual disturbances   Complete by: As directed    Call MD for:  extreme fatigue   Complete by: As directed    Call MD for:  persistant dizziness or light-headedness   Complete by: As directed    Call MD for:   temperature >100.4   Complete by: As directed    Increase activity slowly   Complete by: As directed        Delene Ruffini, MD

## 2022-06-15 NOTE — Plan of Care (Signed)
  Problem: Education: Goal: Ability to describe self-care measures that may prevent or decrease complications (Diabetes Survival Skills Education) will improve Outcome: Progressing   Problem: Coping: Goal: Ability to adjust to condition or change in health will improve Outcome: Progressing   Problem: Fluid Volume: Goal: Ability to maintain a balanced intake and output will improve Outcome: Progressing   Problem: Health Behavior/Discharge Planning: Goal: Ability to identify and utilize available resources and services will improve Outcome: Progressing   Problem: Metabolic: Goal: Ability to maintain appropriate glucose levels will improve Outcome: Progressing   Problem: Skin Integrity: Goal: Risk for impaired skin integrity will decrease Outcome: Progressing   Problem: Tissue Perfusion: Goal: Adequacy of tissue perfusion will improve Outcome: Progressing   Problem: Activity: Goal: Risk for activity intolerance will decrease Outcome: Progressing   Problem: Coping: Goal: Level of anxiety will decrease Outcome: Progressing   Problem: Pain Managment: Goal: General experience of comfort will improve Outcome: Progressing   Problem: Safety: Goal: Ability to remain free from injury will improve Outcome: Progressing

## 2022-06-16 ENCOUNTER — Telehealth: Payer: Self-pay

## 2022-06-16 NOTE — Telephone Encounter (Signed)
Transition Care Management Unsuccessful Follow-up Telephone Call  Date of discharge and from where:  06/15/2022, [facility]  Attempts:  1st Attempt  Reason for unsuccessful TCM follow-up call:  Left voice message- [phone] , call back requested.  Need to schedule a hospital follow up appointment.

## 2022-06-17 ENCOUNTER — Emergency Department (HOSPITAL_COMMUNITY): Payer: BLUE CROSS/BLUE SHIELD

## 2022-06-17 ENCOUNTER — Inpatient Hospital Stay (HOSPITAL_COMMUNITY)
Admission: EM | Admit: 2022-06-17 | Discharge: 2022-06-19 | DRG: 193 | Disposition: A | Discharge status: Home / Self Care | Payer: BLUE CROSS/BLUE SHIELD | Attending: Internal Medicine | Admitting: Internal Medicine

## 2022-06-17 DIAGNOSIS — E119 Type 2 diabetes mellitus without complications: Secondary | ICD-10-CM

## 2022-06-17 DIAGNOSIS — Z86711 Personal history of pulmonary embolism: Secondary | ICD-10-CM

## 2022-06-17 DIAGNOSIS — F1120 Opioid dependence, uncomplicated: Secondary | ICD-10-CM | POA: Diagnosis present

## 2022-06-17 DIAGNOSIS — Z7901 Long term (current) use of anticoagulants: Secondary | ICD-10-CM | POA: Diagnosis not present

## 2022-06-17 DIAGNOSIS — E1142 Type 2 diabetes mellitus with diabetic polyneuropathy: Secondary | ICD-10-CM | POA: Diagnosis present

## 2022-06-17 DIAGNOSIS — E785 Hyperlipidemia, unspecified: Secondary | ICD-10-CM | POA: Diagnosis present

## 2022-06-17 DIAGNOSIS — Z8616 Personal history of COVID-19: Secondary | ICD-10-CM | POA: Diagnosis not present

## 2022-06-17 DIAGNOSIS — F32A Depression, unspecified: Secondary | ICD-10-CM | POA: Diagnosis present

## 2022-06-17 DIAGNOSIS — Z86718 Personal history of other venous thrombosis and embolism: Secondary | ICD-10-CM

## 2022-06-17 DIAGNOSIS — Z7985 Long-term (current) use of injectable non-insulin antidiabetic drugs: Secondary | ICD-10-CM | POA: Diagnosis not present

## 2022-06-17 DIAGNOSIS — Z91041 Radiographic dye allergy status: Secondary | ICD-10-CM | POA: Diagnosis not present

## 2022-06-17 DIAGNOSIS — Z91014 Allergy to mammalian meats: Secondary | ICD-10-CM | POA: Diagnosis not present

## 2022-06-17 DIAGNOSIS — Z79899 Other long term (current) drug therapy: Secondary | ICD-10-CM | POA: Diagnosis not present

## 2022-06-17 DIAGNOSIS — Q984 Klinefelter syndrome, unspecified: Secondary | ICD-10-CM

## 2022-06-17 DIAGNOSIS — E1169 Type 2 diabetes mellitus with other specified complication: Secondary | ICD-10-CM | POA: Diagnosis present

## 2022-06-17 DIAGNOSIS — J189 Pneumonia, unspecified organism: Secondary | ICD-10-CM | POA: Diagnosis present

## 2022-06-17 DIAGNOSIS — R918 Other nonspecific abnormal finding of lung field: Secondary | ICD-10-CM

## 2022-06-17 DIAGNOSIS — J9601 Acute respiratory failure with hypoxia: Secondary | ICD-10-CM | POA: Diagnosis present

## 2022-06-17 DIAGNOSIS — Z833 Family history of diabetes mellitus: Secondary | ICD-10-CM | POA: Diagnosis not present

## 2022-06-17 DIAGNOSIS — Z8249 Family history of ischemic heart disease and other diseases of the circulatory system: Secondary | ICD-10-CM

## 2022-06-17 DIAGNOSIS — F329 Major depressive disorder, single episode, unspecified: Secondary | ICD-10-CM | POA: Diagnosis present

## 2022-06-17 DIAGNOSIS — Z7984 Long term (current) use of oral hypoglycemic drugs: Secondary | ICD-10-CM | POA: Diagnosis not present

## 2022-06-17 DIAGNOSIS — Z91013 Allergy to seafood: Secondary | ICD-10-CM

## 2022-06-17 DIAGNOSIS — F411 Generalized anxiety disorder: Secondary | ICD-10-CM | POA: Diagnosis present

## 2022-06-17 DIAGNOSIS — I152 Hypertension secondary to endocrine disorders: Secondary | ICD-10-CM | POA: Diagnosis present

## 2022-06-17 DIAGNOSIS — E1159 Type 2 diabetes mellitus with other circulatory complications: Secondary | ICD-10-CM | POA: Diagnosis present

## 2022-06-17 DIAGNOSIS — J158 Pneumonia due to other specified bacteria: Secondary | ICD-10-CM

## 2022-06-17 DIAGNOSIS — Z87891 Personal history of nicotine dependence: Secondary | ICD-10-CM | POA: Diagnosis not present

## 2022-06-17 DIAGNOSIS — K219 Gastro-esophageal reflux disease without esophagitis: Secondary | ICD-10-CM | POA: Diagnosis present

## 2022-06-17 LAB — I-STAT VENOUS BLOOD GAS, ED
Acid-Base Excess: 1 mmol/L (ref 0.0–2.0)
Bicarbonate: 24.3 mmol/L (ref 20.0–28.0)
Calcium, Ion: 1.13 mmol/L — ABNORMAL LOW (ref 1.15–1.40)
HCT: 40 % (ref 39.0–52.0)
Hemoglobin: 13.6 g/dL (ref 13.0–17.0)
O2 Saturation: 99 %
Potassium: 3.8 mmol/L (ref 3.5–5.1)
Sodium: 140 mmol/L (ref 135–145)
TCO2: 25 mmol/L (ref 22–32)
pCO2, Ven: 34.2 mmHg — ABNORMAL LOW (ref 44–60)
pH, Ven: 7.46 — ABNORMAL HIGH (ref 7.25–7.43)
pO2, Ven: 148 mmHg — ABNORMAL HIGH (ref 32–45)

## 2022-06-17 LAB — BASIC METABOLIC PANEL
Anion gap: 11 (ref 5–15)
BUN: 8 mg/dL (ref 6–20)
CO2: 22 mmol/L (ref 22–32)
Calcium: 9.1 mg/dL (ref 8.9–10.3)
Chloride: 104 mmol/L (ref 98–111)
Creatinine, Ser: 0.62 mg/dL (ref 0.61–1.24)
GFR, Estimated: >60 mL/min (ref >60)
Glucose, Bld: 101 mg/dL — ABNORMAL HIGH (ref 70–99)
Potassium: 4 mmol/L (ref 3.5–5.1)
Sodium: 137 mmol/L (ref 135–145)

## 2022-06-17 LAB — CBC
HCT: 42.3 % (ref 39.0–52.0)
Hemoglobin: 13.5 g/dL (ref 13.0–17.0)
MCH: 28.1 pg (ref 26.0–34.0)
MCHC: 31.9 g/dL (ref 30.0–36.0)
MCV: 87.9 fL (ref 80.0–100.0)
Platelets: 274 10*3/uL (ref 150–400)
RBC: 4.81 MIL/uL (ref 4.22–5.81)
RDW: 15.5 % (ref 11.5–15.5)
WBC: 5.4 10*3/uL (ref 4.0–10.5)
nRBC: 0 % (ref 0.0–0.2)

## 2022-06-17 LAB — EXPECTORATED SPUTUM ASSESSMENT W GRAM STAIN, RFLX TO RESP C

## 2022-06-17 LAB — RESP PANEL BY RT-PCR (RSV, FLU A&B, COVID)  RVPGX2
Influenza A by PCR: NEGATIVE
Influenza B by PCR: NEGATIVE
Resp Syncytial Virus by PCR: NEGATIVE
SARS Coronavirus 2 by RT PCR: NEGATIVE

## 2022-06-17 LAB — D-DIMER, QUANTITATIVE: D-Dimer, Quant: 1.31 ug/mL-FEU — ABNORMAL HIGH (ref 0.00–0.50)

## 2022-06-17 LAB — STREP PNEUMONIAE URINARY ANTIGEN: Strep Pneumo Urinary Antigen: NEGATIVE

## 2022-06-17 LAB — CBG MONITORING, ED: Glucose-Capillary: 108 mg/dL — ABNORMAL HIGH (ref 70–99)

## 2022-06-17 MED ORDER — RIVAROXABAN 20 MG PO TABS
20.0000 mg | ORAL_TABLET | Freq: Every day | ORAL | Status: DC
  Administered 2022-06-18 – 2022-06-19 (×2): 20 mg via ORAL
  Filled 2022-06-17 (×2): qty 1

## 2022-06-17 MED ORDER — SODIUM CHLORIDE 0.9 % IV SOLN
2.0000 g | Freq: Three times a day (TID) | INTRAVENOUS | Status: DC
  Administered 2022-06-18 – 2022-06-19 (×5): 2 g via INTRAVENOUS
  Filled 2022-06-17 (×5): qty 12.5

## 2022-06-17 MED ORDER — METHADONE HCL 10 MG/ML PO CONC
70.0000 mg | Freq: Every day | ORAL | Status: DC
  Administered 2022-06-18 – 2022-06-19 (×2): 70 mg via ORAL
  Filled 2022-06-17 (×2): qty 10

## 2022-06-17 MED ORDER — DAPAGLIFLOZIN PROPANEDIOL 10 MG PO TABS
10.0000 mg | ORAL_TABLET | Freq: Every day | ORAL | Status: DC
  Administered 2022-06-18 – 2022-06-19 (×2): 10 mg via ORAL
  Filled 2022-06-17 (×3): qty 1

## 2022-06-17 MED ORDER — DULOXETINE HCL 60 MG PO CPEP
60.0000 mg | ORAL_CAPSULE | Freq: Every day | ORAL | Status: DC
  Administered 2022-06-18 – 2022-06-19 (×2): 60 mg via ORAL
  Filled 2022-06-17 (×2): qty 1

## 2022-06-17 MED ORDER — AMLODIPINE BESYLATE 2.5 MG PO TABS
2.5000 mg | ORAL_TABLET | Freq: Every day | ORAL | Status: DC
  Administered 2022-06-18 – 2022-06-19 (×2): 2.5 mg via ORAL
  Filled 2022-06-17 (×2): qty 1

## 2022-06-17 MED ORDER — VANCOMYCIN HCL 1750 MG/350ML IV SOLN
1750.0000 mg | Freq: Two times a day (BID) | INTRAVENOUS | Status: DC
  Administered 2022-06-18: 1250 mg via INTRAVENOUS
  Administered 2022-06-18: 1750 mg via INTRAVENOUS
  Filled 2022-06-17 (×3): qty 350

## 2022-06-17 MED ORDER — VANCOMYCIN HCL 2000 MG/400ML IV SOLN
2000.0000 mg | Freq: Once | INTRAVENOUS | Status: AC
  Administered 2022-06-17: 2000 mg via INTRAVENOUS
  Filled 2022-06-17: qty 400

## 2022-06-17 MED ORDER — ATORVASTATIN CALCIUM 10 MG PO TABS
20.0000 mg | ORAL_TABLET | Freq: Every day | ORAL | Status: DC
  Administered 2022-06-18 – 2022-06-19 (×2): 20 mg via ORAL
  Filled 2022-06-17 (×2): qty 2

## 2022-06-17 MED ORDER — INSULIN ASPART 100 UNIT/ML IJ SOLN
0.0000 [IU] | Freq: Three times a day (TID) | INTRAMUSCULAR | Status: DC
  Administered 2022-06-18: 3 [IU] via SUBCUTANEOUS
  Administered 2022-06-18: 2 [IU] via SUBCUTANEOUS
  Administered 2022-06-18: 3 [IU] via SUBCUTANEOUS
  Administered 2022-06-19: 2 [IU] via SUBCUTANEOUS
  Administered 2022-06-19 (×2): 3 [IU] via SUBCUTANEOUS

## 2022-06-17 MED ORDER — GABAPENTIN 300 MG PO CAPS
600.0000 mg | ORAL_CAPSULE | Freq: Three times a day (TID) | ORAL | Status: DC
  Administered 2022-06-17 – 2022-06-19 (×6): 600 mg via ORAL
  Filled 2022-06-17 (×6): qty 2

## 2022-06-17 NOTE — Progress Notes (Signed)
Pharmacy Antibiotic Note  Timothy [name] is a 52 y.o. male for which pharmacy has been consulted for vancomycin dosing for pneumonia.  Patient with a history of mosaic Klinefelter syndrome, DM, DVT and PE, HTN, GERD. Patient presenting with SOB. Hx of recent COVID infection.  SCr 0.62 WBC 5.4; T 99.2; HR 85; RR 25 COVID neg / flu neg MRSA PCR + on 1/10  Plan: Cefepime per MD Vancomycin 2000 mg once then 1750 mg q12hr (eAUC 508.3) unless change in renal function Trend WBC, Fever, Renal function F/u cultures, clinical course, WBC De-escalate when able Levels at steady state     Temp (24hrs), Avg:99.2 F (37.3 C), Min:99.1 F (37.3 C), Max:99.2 F (37.3 C)  Recent Labs  Lab 06/13/22 1015 06/13/22 1227 06/14/22 0759 06/15/22 0339 06/17/22 1653  WBC 2.6*  --  2.1* 3.0* 5.4  CREATININE 0.63  --  0.59* 0.60* 0.62  LATICACIDVEN 1.2 0.8  --   --   --     Estimated Creatinine Clearance: 154.1 mL/min (by C-G formula based on SCr of 0.62 mg/dL).    Allergies  Allergen Reactions   Beef-Derived Products Anaphylaxis    Alpha Gal.    Food Anaphylaxis    All mammalian meat - anaphylaxis (alpha gal)   Ioxaglate Shortness Of Breath and Other (See Comments)    Dizziness    Ivp Dye [Iodinated Contrast Media] Shortness Of Breath and Other (See Comments)    Dizziness    Pork-Derived Products Anaphylaxis    Alpha Gal.   Shellfish Allergy Swelling    Swelling of face/mouth. No airway restriction.    Antimicrobials this admission: vancomycin 1/12 >>  cefepime 1/12 >>  Microbiology results: Pending  Thank you for allowing pharmacy to be a part of this patient's care.  Lorelei Pont, PharmD, BCPS 06/17/2022 8:37 PM ED Clinical Pharmacist -  [phone]

## 2022-06-17 NOTE — ED Provider Notes (Signed)
[facility]GENCY DEPARTMENT Provider Note   CSN: [id number] Arrival date & time: 06/17/22  1529     History  Chief Complaint  Patient presents with   Shortness of Breath    Timothy [name] is a 52 y.o. male.  Pt indicates recently in hospital with pna, and that since d/c feels persistently sob, and continues to cough. No fever. Indicates o2 was low during hospital stay but ultimately was d/c to home 2 days ago without home o2. No hx asthma, copd or smoking. Indicate prior covid and flu neg. Denies leg pain or swelling. No chest pain or discomfort. Compliant w home meds. O2 sats in mid 80s on room air.   The history is provided by the patient, medical records and the EMS personnel. The history is limited by the condition of the patient.  Shortness of Breath Associated symptoms: cough   Associated symptoms: no abdominal pain, no chest pain, no fever, no headaches, no neck pain, no rash, no sore throat and no vomiting        Home Medications Prior to Admission medications   Medication Sig Start Date End Date Taking? Authorizing Provider  amLODipine (NORVASC) 2.5 MG tablet Take 1 tablet (2.5 mg total) by mouth daily. Patient taking differently: Take 2.5 mg by mouth at bedtime. 12/21/21   Charlott Rakes, MD  atorvastatin (LIPITOR) 20 MG tablet Take 1 tablet (20 mg total) by mouth daily. Patient taking differently: Take 20 mg by mouth at bedtime. 10/19/21   Charlott Rakes, MD  cefdinir (OMNICEF) 300 MG capsule Take 2 capsules (600 mg total) by mouth daily for 2 days. 06/15/22 06/17/22  [name], MD  dapagliflozin propanediol (FARXIGA) 10 MG TABS tablet Take 1 tablet (10 mg total) by mouth daily before breakfast. 10/19/21   Charlott Rakes, MD  DULoxetine (CYMBALTA) 60 MG capsule Take 1 capsule (60 mg total) by mouth daily. Patient taking differently: Take 60 mg by mouth at bedtime. 04/12/22   Charlott Rakes, MD  EPINEPHrine (EPIPEN 2-PAK) 0.3 mg/0.3 mL  IJ SOAJ injection Inject 0.3 mLs (0.3 mg total) into the muscle once as needed for up to 1 dose (for severe allergic reaction). CAll 911 immediately if you have to use this medicine 04/01/19   Kozlow, Donnamarie Poag, MD  gabapentin (NEURONTIN) 300 MG capsule Take 2 capsules (600 mg total) by mouth 3 (three) times daily. 03/22/22   Charlott Rakes, MD  glucose blood (TRUE METRIX BLOOD GLUCOSE TEST) test strip Use 3 times daily 10/19/21   Charlott Rakes, MD  guaiFENesin (MUCINEX) 600 MG 12 hr tablet Take 1 tablet (600 mg total) by mouth 2 (two) times daily. 06/15/22 06/15/23  [name], MD  metFORMIN (GLUCOPHAGE) 1000 MG tablet Take 1 tablet (1,000 mg total) by mouth 2 (two) times daily with a meal. 10/19/21   Charlott Rakes, MD  methadone (DOLOPHINE) 10 MG/ML solution Take 70 mg by mouth daily.    [provider]  omeprazole (PRILOSEC) 40 MG capsule Take 1 capsule (40 mg total) by mouth daily. Patient taking differently: Take 40 mg by mouth in the morning. 01/26/22   Charlott Rakes, MD  predniSONE (DELTASONE) 20 MG tablet Take 1 tablet (20 mg total) by mouth daily with breakfast. Patient not taking: Reported on 06/13/2022 04/12/22   Charlott Rakes, MD  rivaroxaban (XARELTO) 20 MG TABS tablet Take 1 tablet (20 mg total) by mouth daily with supper. Patient taking differently: Take 20 mg by mouth at bedtime. 10/19/21  Hoy Register, MD  Semaglutide,0.25 or 0.5MG /DOS, (OZEMPIC, 0.25 OR 0.5 MG/DOSE,) 2 MG/3ML SOPN Inject 0.25 mg into the skin once a week. Patient taking differently: Inject 0.25 mg into the skin See admin instructions. 0.25 mg injected every Tuesday 12/21/21   Hoy Register, MD  sertraline (ZOLOFT) 100 MG tablet Take 100 mg by mouth at bedtime.    [provider]  TRUEplus Lancets 28G MISC use to test blood sugar 3 (three) times daily before meals. 10/19/21   Hoy Register, MD      Allergies    Beef-derived products, Food, Ioxaglate, Ivp dye [iodinated contrast  media], Pork-derived products, and Shellfish allergy    Review of Systems   Review of Systems  Constitutional:  Negative for fever.  HENT:  Negative for sore throat.   Eyes:  Negative for redness.  Respiratory:  Positive for cough and shortness of breath.   Cardiovascular:  Negative for chest pain and leg swelling.  Gastrointestinal:  Negative for abdominal pain, diarrhea and vomiting.  Genitourinary:  Negative for flank pain.  Musculoskeletal:  Negative for back pain and neck pain.  Skin:  Negative for rash.  Neurological:  Negative for headaches.  Hematological:  Does not bruise/bleed easily.  Psychiatric/Behavioral:  Negative for confusion.     Physical Exam Updated Vital Signs BP (!) 126/90   Pulse 87   Temp 99.1 F (37.3 C) (Oral)   Resp 19   SpO2 95%  Physical Exam Vitals and nursing note reviewed.  Constitutional:      Appearance: Normal appearance. He is well-developed.  HENT:     Head: Atraumatic.     Nose: Nose normal.     Mouth/Throat:     Mouth: Mucous membranes are moist.     Pharynx: Oropharynx is clear.  Eyes:     General: No scleral icterus.    Conjunctiva/sclera: Conjunctivae normal.  Neck:     Trachea: No tracheal deviation.  Cardiovascular:     Rate and Rhythm: Normal rate and regular rhythm.     Pulses: Normal pulses.     Heart sounds: Normal heart sounds. No murmur heard.    No friction rub. No gallop.  Pulmonary:     Effort: Pulmonary effort is normal. No accessory muscle usage or respiratory distress.     Breath sounds: Rhonchi present.  Abdominal:     General: There is no distension.     Palpations: Abdomen is soft.     Tenderness: There is no abdominal tenderness.  Genitourinary:    Comments: No cva tenderness. Musculoskeletal:        General: No swelling or tenderness.     Cervical back: Normal range of motion and neck supple. No rigidity.     Right lower leg: No edema.     Left lower leg: No edema.  Skin:    General: Skin is warm  and dry.     Findings: No rash.  Neurological:     Mental Status: He is alert.     Comments: Alert, speech clear.   Psychiatric:        Mood and Affect: Mood normal.     ED Results / Procedures / Treatments   Labs (all labs ordered are listed, but only abnormal results are displayed) Results for orders placed or performed during the hospital encounter of 06/17/22  CBC  Result Value Ref Range   WBC 5.4 4.0 - 10.5 K/uL   RBC 4.81 4.22 - 5.81 MIL/uL   Hemoglobin 13.5 13.0 -  17.0 g/dL   HCT 42.3 39.0 - 52.0 %   MCV 87.9 80.0 - 100.0 fL   MCH 28.1 26.0 - 34.0 pg   MCHC 31.9 30.0 - 36.0 g/dL   RDW 15.5 11.5 - 15.5 %   Platelets 274 150 - 400 K/uL   nRBC 0.0 0.0 - 0.2 %  D-dimer, quantitative  Result Value Ref Range   D-Dimer, Quant 1.31 (H) 0.00 - 0.50 ug/mL-FEU   DG Chest 1 View  Result Date: 06/13/2022 CLINICAL DATA:  Shortness of breath. Chills. Cough for several days EXAM: CHEST  1 VIEW COMPARISON:  01/22/2021 FINDINGS: Bilateral lower lobe patchy parenchymal opacities greater than midlung zones. Acute infiltrates possible. No pneumothorax, effusion or edema. Normal cardiopericardial silhouette. Degenerative changes of the spine. IMPRESSION: Developing bilateral patchy parenchymal lung opacities, lower lung zones greater than mid. Multifocal pneumonia is possible recommend follow-up to confirm clearance. Electronically Signed   By: Jill Side M.D.   On: 06/13/2022 11:13     EKG EKG Interpretation  Date/Time:  Friday June 17 2022 16:07:57 EST Ventricular Rate:  83 PR Interval:  165 QRS Duration: 79 QT Interval:  366 QTC Calculation: 430 R Axis:   -22 Text Interpretation: Sinus rhythm Nonspecific T wave abnormality Confirmed by Lajean Saver [phone]) on 06/17/2022 4:27:47 PM  Radiology DG Chest Port 1 View  Result Date: 06/17/2022 CLINICAL DATA:  Shortness of breath. EXAM: PORTABLE CHEST 1 VIEW COMPARISON:  Chest x-ray 06/13/2018. FINDINGS: Persistent bibasilar  airspace opacities. No visible pleural effusions or pneumothorax. Similar cardiomediastinal silhouette. No evidence of acute osseous abnormality. IMPRESSION: Persistent bibasilar airspace opacities, suspicious for pneumonia. Recommend continued imaging follow-up to resolution. Electronically Signed   By: Margaretha Sheffield M.D.   On: 06/17/2022 16:39    Procedures Procedures    Medications Ordered in ED Medications - No data to display  ED Course/ Medical Decision Making/ A&P                             Medical Decision Making Problems Addressed: Acute respiratory failure with hypoxia [facility]): acute illness or injury with systemic symptoms that poses a threat to life or bodily functions Bilateral pulmonary infiltrates on chest x-ray: acute illness or injury with systemic symptoms  Amount and/or Complexity of Data Reviewed Independent Historian: EMS    Details: hx External Data Reviewed: notes. Labs: ordered. Decision-making details documented in ED Course. Radiology: ordered and independent interpretation performed. Decision-making details documented in ED Course. ECG/medicine tests: ordered and independent interpretation performed. Decision-making details documented in ED Course. Discussion of management or test interpretation with external provider(s): medicine  Risk Prescription drug management. Decision regarding hospitalization.   Iv ns. Continuous pulse ox and cardiac monitoring. Labs ordered/sent. Imaging ordered.   Differential diagnosis includes  pneumonia, bronchitis, covid, flu, etc. Dispo decision including potential need for admission considered - will get labs and imaging and reassess.   Reviewed nursing notes and prior charts for additional history. External reports reviewed. Additional history from: EMS.    On room air sats mid 80s, on 5 liters sats 95%.   Cardiac monitor: sinus rhythm, rate 88.  Labs reviewed/interpreted by me - wbc normal.   Xrays  reviewed/interpreted by me - bilbas infil.  Medicine resident consulted for admission as pt with hypoxia on room air - requiring o2.   Ddimer is high - as no fever, wbc normal, no bad cough, etc. pt may benefit from additional imaging to  clarify whether bact pna, vs PE vs possible persistent infiltrates from reported covid a few weeks ago (reported pos home test).  Pt is currently on anticoagulant therapy.  (Pt is noted to have iv contrast allergy).             Final Clinical Impression(s) / ED Diagnoses Final diagnoses:  None    Rx / DC Orders ED Discharge Orders     None         Cathren Laine, MD 06/17/22 1754

## 2022-06-17 NOTE — ED Notes (Signed)
ED TO INPATIENT HANDOFF REPORT  ED Nurse Name and Phone #: Kelby Aline RN [phone]   S Name/Age/Gender Timothy [name] 52 y.o. male Room/Bed: 016C/016C  Code Status   Code Status: Full Code  Home/SNF/Other Home Patient oriented to: self, place, time, and situation Is this baseline? Yes   Triage Complete: Triage complete  Chief Complaint Acute respiratory failure with hypoxia ([place]) [J96.01]  Triage Note No notes on file   Allergies Allergies  Allergen Reactions   Beef-Derived Products Anaphylaxis    Alpha Gal.    Food Anaphylaxis    All mammalian meat - anaphylaxis (alpha gal)   Ioxaglate Shortness Of Breath and Other (See Comments)    Dizziness    Ivp Dye [Iodinated Contrast Media] Shortness Of Breath and Other (See Comments)    Dizziness    Pork-Derived Products Anaphylaxis    Alpha Gal.   Shellfish Allergy Swelling    Swelling of face/mouth. No airway restriction.    Level of Care/Admitting Diagnosis ED Disposition     ED Disposition  Admit   Condition  --   [place]: [facility] [[id number]]  Level of Care: Telemetry Medical [[id number]]  May admit patient to [facility] or [facility] if equivalent level of care is available:: No  Covid Evaluation: Confirmed COVID Negative  Diagnosis: Acute respiratory failure with hypoxia [facility]) [[id number]]  Admitting Physician: Cresenciano Lick  Attending Physician: Sid Falcon [[id number]]  Certification:: I certify this patient will need inpatient services for at least 2 midnights  Estimated Length of Stay: 2          B Medical/Surgery History Past Medical History:  Diagnosis Date   Carpal tunnel syndrome on both sides 03/28/2013   Chronic headache    Diabetes mellitus without complication ([place])    DVT (deep venous thrombosis) ([place])    2017-2018: 4 dvts   GERD (gastroesophageal reflux disease)    Helicobacter pylori gastritis 06/30/2006   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    Pulmonary embolism ([place])    2013: 2 total   TIA (transient ischemic attack)    Past Surgical History:  Procedure Laterality Date   KNEE SURGERY Right 10/2016     A IV Location/Drains/Wounds Patient Lines/Drains/Airways Status     Active Line/Drains/Airways     Name Placement date Placement time Site Days   Peripheral IV 06/17/22 20 G Anterior;Right Wrist 06/17/22  1456  Wrist  less than 1   Peripheral IV 06/17/22 20 G 1.88" Anterior;Left Forearm 06/17/22  2156  Forearm  less than 1            Intake/Output Last 24 hours No intake or output data in the 24 hours ending 06/17/22 2223  Labs/Imaging Results for orders placed or performed during the hospital encounter of 06/17/22 (from the past 48 hour(s))  Resp panel by RT-PCR (RSV, Flu A&B, Covid) Anterior Nasal Swab     Status: None   Collection Time: 06/17/22  4:06 PM   Specimen: Anterior Nasal Swab  Result Value Ref Range   SARS Coronavirus 2 by RT PCR NEGATIVE NEGATIVE    Comment: (NOTE) SARS-CoV-2 target nucleic acids are NOT DETECTED.  The SARS-CoV-2 RNA is generally detectable in upper respiratory specimens during the acute phase of infection. The lowest concentration of SARS-CoV-2 viral copies this assay can detect is 138 copies/mL. A negative result does not preclude SARS-Cov-2 infection and should not be used as the sole basis for treatment or other patient  management decisions. A negative result may occur with  improper specimen collection/handling, submission of specimen other than nasopharyngeal swab, presence of viral mutation(s) within the areas targeted by this assay, and inadequate number of viral copies(<138 copies/mL). A negative result must be combined with clinical observations, patient history, and epidemiological information. The expected result is Negative.  Fact Sheet for Patients:  BloggerCourse.com  Fact Sheet for Healthcare Providers:   SeriousBroker.it  This test is no t yet approved or cleared by the [place] FDA and  has been authorized for detection and/or diagnosis of SARS-CoV-2 by FDA under an Emergency Use Authorization (EUA). This EUA will remain  in effect (meaning this test can be used) for the duration of the COVID-19 declaration under Section 564(b)(1) of the Act, 21 U.S.C.section 360bbb-3(b)(1), unless the authorization is terminated  or revoked sooner.       Influenza A by PCR NEGATIVE NEGATIVE   Influenza B by PCR NEGATIVE NEGATIVE    Comment: (NOTE) The Xpert Xpress SARS-CoV-2/FLU/RSV plus assay is intended as an aid in the diagnosis of influenza from Nasopharyngeal swab specimens and should not be used as a sole basis for treatment. Nasal washings and aspirates are unacceptable for Xpert Xpress SARS-CoV-2/FLU/RSV testing.  Fact Sheet for Patients: BloggerCourse.com  Fact Sheet for Healthcare Providers: SeriousBroker.it  This test is not yet approved or cleared by the [place] FDA and has been authorized for detection and/or diagnosis of SARS-CoV-2 by FDA under an Emergency Use Authorization (EUA). This EUA will remain in effect (meaning this test can be used) for the duration of the COVID-19 declaration under Section 564(b)(1) of the Act, 21 U.S.C. section 360bbb-3(b)(1), unless the authorization is terminated or revoked.     Resp Syncytial Virus by PCR NEGATIVE NEGATIVE    Comment: (NOTE) Fact Sheet for Patients: BloggerCourse.com  Fact Sheet for Healthcare Providers: SeriousBroker.it  This test is not yet approved or cleared by the [place] FDA and has been authorized for detection and/or diagnosis of SARS-CoV-2 by FDA under an Emergency Use Authorization (EUA). This EUA will remain in effect (meaning this test can be used) for the duration of  the COVID-19 declaration under Section 564(b)(1) of the Act, 21 U.S.C. section 360bbb-3(b)(1), unless the authorization is terminated or revoked.  Performed at [facility], [address], [place], [place] [address]   CBC     Status: None   Collection Time: 06/17/22  4:53 PM  Result Value Ref Range   WBC 5.4 4.0 - 10.5 K/uL   RBC 4.81 4.22 - 5.81 MIL/uL   Hemoglobin 13.5 13.0 - 17.0 g/dL   HCT 60.4 54.0 - 98.1 %   MCV 87.9 80.0 - 100.0 fL   MCH 28.1 26.0 - 34.0 pg   MCHC 31.9 30.0 - 36.0 g/dL   RDW 19.1 47.8 - 29.5 %   Platelets 274 150 - 400 K/uL   nRBC 0.0 0.0 - 0.2 %    Comment: Performed at [facility], [address], [place], [place] [address]  Basic metabolic panel     Status: Abnormal   Collection Time: 06/17/22  4:53 PM  Result Value Ref Range   Sodium 137 135 - 145 mmol/L   Potassium 4.0 3.5 - 5.1 mmol/L   Chloride 104 98 - 111 mmol/L   CO2 22 22 - 32 mmol/L   Glucose, Bld 101 (H) 70 - 99 mg/dL    Comment: Glucose reference range applies only to samples taken after  fasting for at least 8 hours.   BUN 8 6 - 20 mg/dL   Creatinine, Ser 2.84 0.61 - 1.24 mg/dL   Calcium 9.1 8.9 - 13.2 mg/dL   GFR, Estimated >44 >01 mL/min    Comment: (NOTE) Calculated using the CKD-EPI Creatinine Equation (2021)    Anion gap 11 5 - 15    Comment: Performed at [facility], [address] [place], [place] [address]  D-dimer, quantitative     Status: Abnormal   Collection Time: 06/17/22  4:53 PM  Result Value Ref Range   D-Dimer, Quant 1.31 (H) 0.00 - 0.50 ug/mL-FEU    Comment: (NOTE) At the manufacturer cut-off value of 0.5 g/mL FEU, this assay has a negative predictive value of 95-100%.This assay is intended for use in conjunction with a clinical pretest probability (PTP) assessment model to exclude pulmonary embolism (PE) and deep venous thrombosis (DVT) in outpatients suspected of PE or DVT. Results should be correlated with clinical  presentation. Performed at [facility], [address], [place], [place] [address]   Strep pneumoniae urinary antigen     Status: None   Collection Time: 06/17/22  8:14 PM  Result Value Ref Range   Strep Pneumo Urinary Antigen NEGATIVE NEGATIVE    Comment:        Infection due to S. pneumoniae cannot be absolutely ruled out since the antigen present may be below the detection limit of the test. Performed at [facility], [address] [place], [place] [address]   I-Stat venous blood gas, ED     Status: Abnormal   Collection Time: 06/17/22  9:31 PM  Result Value Ref Range   pH, Ven 7.460 (H) 7.25 - 7.43   pCO2, Ven 34.2 (L) 44 - 60 mmHg   pO2, Ven 148 (H) 32 - 45 mmHg   Bicarbonate 24.3 20.0 - 28.0 mmol/L   TCO2 25 22 - 32 mmol/L   O2 Saturation 99 %   Acid-Base Excess 1.0 0.0 - 2.0 mmol/L   Sodium 140 135 - 145 mmol/L   Potassium 3.8 3.5 - 5.1 mmol/L   Calcium, Ion 1.13 (L) 1.15 - 1.40 mmol/L   HCT 40.0 39.0 - 52.0 %   Hemoglobin 13.6 13.0 - 17.0 g/dL   Sample type VENOUS   CBG monitoring, ED     Status: Abnormal   Collection Time: 06/17/22  9:43 PM  Result Value Ref Range   Glucose-Capillary 108 (H) 70 - 99 mg/dL    Comment: Glucose reference range applies only to samples taken after fasting for at least 8 hours.   DG Chest Port 1 View  Result Date: 06/17/2022 CLINICAL DATA:  Shortness of breath. EXAM: PORTABLE CHEST 1 VIEW COMPARISON:  Chest x-ray 06/13/2018. FINDINGS: Persistent bibasilar airspace opacities. No visible pleural effusions or pneumothorax. Similar cardiomediastinal silhouette. No evidence of acute osseous abnormality. IMPRESSION: Persistent bibasilar airspace opacities, suspicious for pneumonia. Recommend continued imaging follow-up to resolution. Electronically Signed   By: Feliberto Harts M.D.   On: 06/17/2022 16:39    Pending Labs Unresulted Labs (From admission, onward)     Start     Ordered   06/18/22 0500  CBC  Tomorrow morning,    R        06/17/22 2009   06/18/22 0500  Basic metabolic panel  Tomorrow morning,   R        06/17/22 2009   06/17/22 2021  Blood gas, venous  Once,  R        06/17/22 2020   06/17/22 2014  Expectorated Sputum Assessment w Gram Stain, Rflx to Resp Cult  Once,   R        06/17/22 2013   06/17/22 2014  Legionella Pneumophila Serogp 1 Ur Ag  Once,   R        06/17/22 2013            Vitals/Pain Today's Vitals   06/17/22 1842 06/17/22 1900 06/17/22 1930 06/17/22 2000  BP: 122/83 121/77 118/81 (!) 132/90  Pulse: 82 81 74 85  Resp: 17 (!) 26 (!) 21 (!) 25  Temp: 99.2 F (37.3 C)     TempSrc: Oral     SpO2: 92% 94% 96% 95%  PainSc:        Isolation Precautions No active isolations  Medications Medications  amLODipine (NORVASC) tablet 2.5 mg (has no administration in time range)  atorvastatin (LIPITOR) tablet 20 mg (has no administration in time range)  dapagliflozin propanediol (FARXIGA) tablet 10 mg (has no administration in time range)  DULoxetine (CYMBALTA) DR capsule 60 mg (has no administration in time range)  gabapentin (NEURONTIN) capsule 600 mg (has no administration in time range)  methadone (DOLOPHINE) 10 MG/ML solution 70 mg (has no administration in time range)  rivaroxaban (XARELTO) tablet 20 mg (has no administration in time range)  insulin aspart (novoLOG) injection 0-15 Units (has no administration in time range)  ceFEPIme (MAXIPIME) 2 g in sodium chloride 0.9 % 100 mL IVPB (has no administration in time range)  vancomycin (VANCOREADY) IVPB 2000 mg/400 mL (0 mg Intravenous Paused 06/17/22 2117)  vancomycin (VANCOREADY) IVPB 1750 mg/350 mL (has no administration in time range)    Mobility walks Low fall risk   Focused Assessments Pulmonary Assessment Handoff:  Lung sounds:   O2 Device: Room Air O2 Flow Rate (L/min): 5 L/min    R Recommendations: See Admitting Provider Note  Report given to:   Additional Notes:

## 2022-06-17 NOTE — H&P (Signed)
Date: 06/17/2022               Patient Name:  Timothy [name] MRN: [id number]  DOB: March 18, 1971 Age / Sex: 52 y.o., male   PCP: Charlott Rakes, MD         Medical Service: Internal Medicine Teaching Service         Attending Physician: Dr. Sid Falcon, MD    First Contact: Starlyn Skeans, MD      Pager: [phone]      Second Contact: Delene Ruffini, MD      Pager: [phone]           After Hours (After 5p/  First Contact Pager: [phone]  weekends / holidays): Second Contact Pager: [phone]   SUBJECTIVE   Chief Complaint: Shortness of breath  History of Present Illness:  Mr Marengo is a 52 year old male with a past medical history of mosaic Klinefelter syndrome, diabetes, deep vein thrombosis complicated by pulmonary embolism, hypertension, generalized anxiety disorder, major depressive disorder, and gastroesophageal reflux disease who presents with dyspnea on exertion.  Patient was recently admitted last week for community-acquired pneumonia in the setting of recent COVID infection.  He was treated with ceftriaxone and azithromycin, plus two additional days of cefdinir at discharge on 1-10.  Patient required 2-3L/min of oxygen during last admission, but was discharged without home oxygen.  His O2 sat was above 90% with ambulation.  He describes experiencing increased breath shortness at home, especially with exertion.  He was unable to walk more than 5 steps without feeling winded.  This afternoon, after using the bathroom, he had a presyncopal episode when walking back to his room, which prompted him to call an ambulance.  Patient states that his weakness over the past week has affected his ability to perform ADLs and care for his parents. This weakness has remained unchanged in severity since its manifestation.  He denies fever or chills, but endorses diaphoresis.  He has productive cough with yellow sputum, which has also remained unchanged over the past week or so.   He denies congestion, rhinorrhea, leg pain or swelling, pleuritic chest pain, orthopnea, diarrhea, and dysuria.  He reports poor oral intake of both food and water in the last two days.  Denies history of COPD, asthma, and heart failure.  Reports full adherence to his medication including rivaroxaban including today's doses.  He has history of PE that occurred more than 10 years ago.    ED Course: Upon arrival to the ED, vital signs were notable for SpO2 in the mid-80s. Laboratory testing demonstrated Gluc 101 and a D-dimer of 1.31. Chest radiograph revealed bibasilar airspace opacities suspicious for pneumonia persistent from prior study on 1-8. Patient was placed on 5L/min of supplemental oxygen.   Meds:  Current Meds  Medication Sig   amLODipine (NORVASC) 2.5 MG tablet Take 1 tablet (2.5 mg total) by mouth daily.   atorvastatin (LIPITOR) 20 MG tablet Take 1 tablet (20 mg total) by mouth daily.   cefdinir (OMNICEF) 300 MG capsule Take 2 capsules (600 mg total) by mouth daily for 2 days.   Semaglutide,0.25 or 0.5MG /DOS, (OZEMPIC, 0.25 OR 0.5 MG/DOSE,) 2 MG/3ML SOPN Inject 0.25 mg into the skin once a week. (Patient taking differently: Inject 0.25 mg into the skin See admin instructions. 0.25 mg injected every Tuesday)    Past Medical History  Past Surgical History:  Procedure Laterality Date   KNEE SURGERY Right 10/2016     Social:  Lives With:  parents Occupation: disability Level of Function: independent in ADLs, cares for elderly parents PCP: Hoy Register MD Substances: previously used heroin last used two years ago, denies current tobacco and alcohol use    Family History:  Family History  Problem Relation Age of Onset   Barrett's esophagus Mother    Breast cancer Mother    Irritable bowel syndrome Brother    Allergies Brother    Diabetes Brother    Heart disease Father    Heart attack Father    Diabetes Sister    Diabetes Paternal Grandfather    Heart disease  Paternal Grandfather    Heart disease Paternal Grandmother    Heart disease Maternal Grandmother    Heart disease Maternal Grandfather    Colon cancer Neg Hx    Esophageal cancer Neg Hx       Allergies: Allergies as of 06/17/2022 - Reviewed 06/17/2022  Allergen Reaction Noted   Beef-derived products Anaphylaxis 02/17/2021   Food Anaphylaxis 06/13/2022   Ioxaglate Shortness Of Breath and Other (See Comments) 07/16/2014   Ivp dye [iodinated contrast media] Shortness Of Breath and Other (See Comments) 11/22/2011   Pork-derived products Anaphylaxis 10/31/2020   Shellfish allergy Swelling 04/17/2018      Review of Systems: A complete ROS was negative except as per HPI.    OBJECTIVE:   Physical Exam: Blood pressure 121/77, pulse 81, temperature 99.2 F (37.3 C), temperature source Oral, resp. rate (!) 26, SpO2 94 % on 5L/min [place]  General:      awake and alert, lying comfortably in bed, cooperative and pleasant, not in acute distress Skin:       warm and dry, intact without any obvious lesions or scars, no rashes Eyes:      extraocular movements intact, conjunctivae pink Lungs:      normal respiratory effort, breathing unlabored, symmetrical chest rise, diffuse course breath sounds with very mild crackles, no wheezing Cardiac:      regular rate and rhythm, normal S1 and S2, capillary refill <1 second, no pitting edema Abdomen:      soft and non-distended, hypoactive bowel sounds, no tenderness to palpation or guarding Neurologic:      oriented to person-place-time, moving all extremities, no gross focal deficits Psychiatric:      euthymic mood with congruent affect, intelligible speech   Labs: CBC    Component Value Date/Time   WBC 5.4 06/17/2022 1653   RBC 4.81 06/17/2022 1653   HGB 13.5 06/17/2022 1653   HGB 14.9 06/04/2015 0955   HCT 42.3 06/17/2022 1653   HCT 42.5 06/04/2015 0955   PLT 274 06/17/2022 1653   PLT 176 06/04/2015 0955   MCV 87.9 06/17/2022 1653    MCV 92 06/04/2015 0955   MCH 28.1 06/17/2022 1653   MCHC 31.9 06/17/2022 1653   RDW 15.5 06/17/2022 1653   RDW 12.8 06/04/2015 0955   LYMPHSABS 0.6 (L) 06/13/2022 1015   LYMPHSABS 1.7 06/04/2015 0955   MONOABS 0.2 06/13/2022 1015   EOSABS 0.0 06/13/2022 1015   EOSABS 0.3 06/04/2015 0955   BASOSABS 0.0 06/13/2022 1015   BASOSABS 0.0 06/04/2015 0955     CMP     Component Value Date/Time   NA 137 06/17/2022 1653   NA 141 10/19/2021 1437   K 4.0 06/17/2022 1653   CL 104 06/17/2022 1653   CO2 22 06/17/2022 1653   GLUCOSE 101 (H) 06/17/2022 1653   BUN 8 06/17/2022 1653   BUN 12 10/19/2021 1437   CREATININE  0.62 06/17/2022 1653   CALCIUM 9.1 06/17/2022 1653   PROT 6.6 06/13/2022 1015   PROT 7.4 10/19/2021 1437   ALBUMIN 3.1 (L) 06/13/2022 1015   ALBUMIN 4.5 10/19/2021 1437   AST 42 (H) 06/13/2022 1015   ALT 24 06/13/2022 1015   ALKPHOS 116 06/13/2022 1015   BILITOT 0.4 06/13/2022 1015   BILITOT <0.2 10/19/2021 1437   GFRNONAA >60 06/17/2022 1653   GFRAA >60 12/03/2018 0130     Imaging:  DG Chest Port 1 View Result Date: 06/17/2022 IMPRESSION: Persistent bibasilar airspace opacities, suspicious for pneumonia. Recommend continued imaging follow-up to resolution.   ECG: My personal interpretation is normal sinus rhythm, which is unchanged from prior ECG on 06-13-2022    ASSESSMENT & PLAN:   Assessment & Plan by Problem: Principal Problem:   Acute respiratory failure with hypoxia (HCC) Active Problems:   Mosaic Klinefelter syndrome   Methadone maintenance therapy patient ([place])   Type 2 diabetes mellitus (HCC)   Hypertension associated with diabetes ([place])   Community acquired pneumonia   History of pulmonary embolus (PE)   Mr Abdallah is a 52 year old male with a past medical history of mosaic Klinefelter syndrome, diabetes, deep vein thrombosis complicated by pulmonary embolism, hypertension, generalized anxiety disorder, major depressive disorder, and  gastroesophageal reflux disease who presents with dyspnea on exertion, now admitted for workup of persistent breath shortness.   ---Bibasilar pneumonia ---Recent coronavirus infection Patient was recently discharged from [facility] on 1-10 for community acquired pneumonia in the setting of recent COVID infection. He was treated with supplemental oxygen at 2-3L/min plus a five-day course of ceftriaxone and azithromycin. After returning home his breath shortness worsened and he experienced significant dyspnea on exertion, which prompted him to return. His other associated symptoms including cough and fatigue have remained about the same. Upon arrival, to the ED, vitals were notable for SpO2 in the mid-80s. Imaging revealed persistence of bibasilar airspace opacities consistent with pneumonia. Exam demonstrated course breath sounds throughout all fields without any chest pain. This presentation is consistent with community acquired pneumonia refractory to antibiotics. Primary team will expand coverage to include P.aeruginosa and MRSA, for which nasal swab tested positive. Given history of pulmonary embolism, recent COVID infection, and high oxygen requirement, elevated D-dimer, and patient's anaphylactic reaction to iodine, a ventilation-perfusion scan is also warranted. > Cefepime 2g q8 > Vancomycin 1.75g q12 > Supplemental oxygen to maintain SpO2>92% > Physical-occupational therapy evaluation > Check VBG > Ventilation-perfusion scan > Trend CBC q24 > Trend BMP q24 > Follow sputum culture, no growth day 0   ---History of pulmonary embolism secondary to DVT Patient has a history of deep venous thrombosis and subsequent pulmonary embolism in 02-2021 after self-discontinuing anticoagulation medications. He currently takes rivaroxaban and reports daily adherence to this medication. Upon arrival, patient denies any chest pain and leg swelling. Examination negative for lower extremity erythema or edema.  Current symptoms are best explained by persistent pneumonia infection, though ventilation perfusion mismatch scan is warranted given history of pulmonary embolism.  > Rivaroxaban 20mg  q24 > Ventilation-perfusion scan   ---Polysubstance use disorder ---Opiate dependency Patient has history of using multiple substances including heroin and now receives methodone from the [facility], which he takes daily. Methadone was provided to him throughout his recent hospitalization with [facility].  > Methadone 70mg  q24   ---Diabetes complicated by peripheral neuropathy Patient has history of diabetes, most recent A1C 06-2022 was 8.3. He takes dapagliflozin, metformin, and gabapentin at home.  > Insulin,  sliding scale, moderate sensitivity  > Dapagliflozin 10mg  q24 > Gabapentin 600mg  q8   ---Hypertension ---Hyperlipidemia Patient has history of hypertension and hyperlipidemia. Managed at home with amlodipine and atorvastatin respectively. Since arrival to the ED, his blood pressure has been within the normal range. > Amlodipine 2.5mg  q24 > Atorvastatin 20mg  q24   ---Major depressive disorder ---Generalized anxiety disorder Patient has history of depression and anxiety managed at home with duloxetine. > Duloxetine 60mg  q24     Diet: Normal VTE: NOAC IVF: None,None Code: Full  Prior to Admission Living Arrangement: Home, living with parents Anticipated Discharge Location: Home Barriers to Discharge: oxygen requirement, pneumonia treatment  Dispo: Admit patient to Observation with expected length of stay less than 2 midnights.  Signed: , DO Internal Medicine Resident PGY-1  06/17/2022, 8:26 PM

## 2022-06-17 NOTE — Hospital Course (Signed)
-  gotten more SOB especially w exertion. Can not walk more than 5 steps -This PM, used bathroom, tried to walk back felt like almost passing out -Covid positive at home, admitted for CAP. Breathing did get better but feel weak. When home, same degree of weakness.  -No fever, chills, some sweats. -Productive cough, yellow sputum -No congestion, no runny nose -Only use 3L last hospitalization No leg swelling, pain. No chest pain, pleuritic CP. No orthopnea.  Abd pain, N/V, diarrhea, dysuria.  -Poor PO intake.  No Hx of COPD, asthma or HF Take care of 2 parents at home   ?chest pain  VBG  No  CP or leg pain Some crackles, but lungs don't sound bad He does not wear O2 at home normally Would still like to check VQ scan if he doesn't improve Abx, o2 Control chronic Eating and bathrooming okay Nursing amb o2

## 2022-06-18 DIAGNOSIS — J189 Pneumonia, unspecified organism: Secondary | ICD-10-CM | POA: Diagnosis not present

## 2022-06-18 DIAGNOSIS — Z87891 Personal history of nicotine dependence: Secondary | ICD-10-CM | POA: Diagnosis not present

## 2022-06-18 DIAGNOSIS — J9601 Acute respiratory failure with hypoxia: Secondary | ICD-10-CM | POA: Diagnosis not present

## 2022-06-18 LAB — GLUCOSE, CAPILLARY
Glucose-Capillary: 124 mg/dL — ABNORMAL HIGH (ref 70–99)
Glucose-Capillary: 142 mg/dL — ABNORMAL HIGH (ref 70–99)
Glucose-Capillary: 170 mg/dL — ABNORMAL HIGH (ref 70–99)
Glucose-Capillary: 174 mg/dL — ABNORMAL HIGH (ref 70–99)
Glucose-Capillary: 185 mg/dL — ABNORMAL HIGH (ref 70–99)

## 2022-06-18 LAB — CBC
HCT: 39.9 % (ref 39.0–52.0)
Hemoglobin: 12.7 g/dL — ABNORMAL LOW (ref 13.0–17.0)
MCH: 28.3 pg (ref 26.0–34.0)
MCHC: 31.8 g/dL (ref 30.0–36.0)
MCV: 89.1 fL (ref 80.0–100.0)
Platelets: 293 10*3/uL (ref 150–400)
RBC: 4.48 MIL/uL (ref 4.22–5.81)
RDW: 15.7 % — ABNORMAL HIGH (ref 11.5–15.5)
WBC: 6.9 10*3/uL (ref 4.0–10.5)
nRBC: 0 % (ref 0.0–0.2)

## 2022-06-18 LAB — BLOOD GAS, VENOUS
Acid-Base Excess: 1.2 mmol/L (ref 0.0–2.0)
Bicarbonate: 26 mmol/L (ref 20.0–28.0)
O2 Saturation: 98 %
Patient temperature: 37.5
pCO2, Ven: 42 mmHg — ABNORMAL LOW (ref 44–60)
pH, Ven: 7.4 (ref 7.25–7.43)
pO2, Ven: 92 mmHg — ABNORMAL HIGH (ref 32–45)

## 2022-06-18 LAB — CULTURE, BLOOD (ROUTINE X 2)
Culture: NO GROWTH
Culture: NO GROWTH
Special Requests: ADEQUATE
Special Requests: ADEQUATE

## 2022-06-18 LAB — BASIC METABOLIC PANEL
Anion gap: 11 (ref 5–15)
BUN: 11 mg/dL (ref 6–20)
CO2: 24 mmol/L (ref 22–32)
Calcium: 8.8 mg/dL — ABNORMAL LOW (ref 8.9–10.3)
Chloride: 103 mmol/L (ref 98–111)
Creatinine, Ser: 0.75 mg/dL (ref 0.61–1.24)
GFR, Estimated: >60 mL/min (ref >60)
Glucose, Bld: 101 mg/dL — ABNORMAL HIGH (ref 70–99)
Potassium: 3.7 mmol/L (ref 3.5–5.1)
Sodium: 138 mmol/L (ref 135–145)

## 2022-06-18 NOTE — Evaluation (Signed)
Occupational Therapy Evaluation Patient Details Name: Timothy [name] MRN: [id number] DOB: Mar 13, 1971 Today's Date: 06/18/2022   History of Present Illness 52 year old male adm 1/12 with a past medical history of mosaic Klinefelter syndrome, diabetes, deep vein thrombosis complicated by pulmonary embolism, hypertension, generalized anxiety disorder, major depressive disorder, and gastroesophageal reflux disease who presents with dyspnea on exertion.   Clinical Impression   Patient admitted for the diagnosis above.  PTA he lives with his parents and assists in their care.  He completes all iADL, community mobility and assists with ADL as needed, more so for his father.  Both his parents have dementia.  Patient does not use O2 at baseline, and is currently needing up to 2L with mobility, and 1 L at rest to maintain O2 sats above 91%.  With O2 off and mobility in the room he dropped to 89%.  Patient did not need any balance support, and needs no physical assist for ADL completion, only supervision for line management.  Mobility team referral placed, but no significant OT needs exist in the acute setting.  Patient educated on rest breaks and pursed lip breathing.  No post acute OT is anticipated.        Recommendations for follow up therapy are one component of a multi-disciplinary discharge planning process, led by the attending physician.  Recommendations may be updated based on patient status, additional functional criteria and insurance authorization.   Follow Up Recommendations  No OT follow up     Assistance Recommended at Discharge PRN  Patient can return home with the following      Functional Status Assessment  Patient has not had a recent decline in their functional status  Equipment Recommendations  None recommended by OT    Recommendations for Other Services       Precautions / Restrictions Precautions Precautions: Other (comment) Precaution Comments: O2  sats Restrictions Weight Bearing Restrictions: No      Mobility Bed Mobility Overal bed mobility: Independent               Patient Response: Cooperative  Transfers Overall transfer level: Modified independent Equipment used: None                      Balance Overall balance assessment: Mild deficits observed, not formally tested                                         ADL either performed or assessed with clinical judgement   ADL Overall ADL's : At baseline                                       General ADL Comments: Increased supervision for line mangement and rest breaks, but no pyhsical assist     Vision Patient Visual Report: No change from baseline       Perception     Praxis      Pertinent Vitals/Pain Pain Assessment Pain Assessment: No/denies pain     Hand Dominance Right   Extremity/Trunk Assessment Upper Extremity Assessment Upper Extremity Assessment: Overall WFL for tasks assessed   Lower Extremity Assessment Lower Extremity Assessment: Defer to PT evaluation   Cervical / Trunk Assessment Cervical / Trunk Assessment: Kyphotic   Communication Communication Communication: No difficulties   Cognition Arousal/Alertness:  Awake/alert Behavior During Therapy: WFL for tasks assessed/performed Overall Cognitive Status: Within Functional Limits for tasks assessed                                       General Comments   VSS on 1L    Exercises     Shoulder Instructions      Home Living Family/patient expects to be discharged to:: Private residence Living Arrangements: Parent Available Help at Discharge: Family Type of Home: House Home Access: Ramped entrance;Stairs to enter Entrance Stairs-Number of Steps: 4 at side to enter Entrance Stairs-Rails: Right Home Layout: One level     Bathroom Shower/Tub: Teacher, early years/pre: Standard Bathroom Accessibility:  Yes How Accessible: Accessible via walker Home Equipment: None   Additional Comments: Stays with his parents and assists with there care.  Both parents have dementia.  Mom is mobile, father is bedbound      Prior Functioning/Environment Prior Level of Function : Independent/Modified Independent;Driving                        OT Problem List: Decreased activity tolerance      OT Treatment/Interventions:      OT Goals(Current goals can be found in the care plan section) Acute Rehab OT Goals Patient Stated Goal: Hoping to return home OT Goal Formulation: With patient Time For Goal Achievement: 06/25/22 Potential to Achieve Goals: Good  OT Frequency:      Co-evaluation              AM-PAC OT "6 Clicks" Daily Activity     Outcome Measure Help from another person eating meals?: None Help from another person taking care of personal grooming?: None Help from another person toileting, which includes using toliet, bedpan, or urinal?: None Help from another person bathing (including washing, rinsing, drying)?: None Help from another person to put on and taking off regular upper body clothing?: None Help from another person to put on and taking off regular lower body clothing?: None 6 Click Score: 24   End of Session Equipment Utilized During Treatment: Oxygen Nurse Communication: Mobility status  Activity Tolerance: Patient tolerated treatment well Patient left: in chair;with call bell/phone within reach  OT Visit Diagnosis: Other (comment) (Respiratory issues)                Time: 1053-1110 OT Time Calculation (min): 17 min Charges:  OT General Charges $OT Visit: 1 Visit OT Evaluation $OT Eval Moderate Complexity: 1 Mod  06/18/2022  RP, OTR/L  Acute Rehabilitation Services  Office:  [phone]   Metta Clines 06/18/2022, 11:50 AM

## 2022-06-18 NOTE — Progress Notes (Signed)
Per PT , patient's O2 sat was 89% on RA during ambulation and 93% on RA at rest after ambulating.

## 2022-06-18 NOTE — Plan of Care (Signed)
  Problem: Skin Integrity: Goal: Risk for impaired skin integrity will decrease Outcome: Progressing   Problem: Clinical Measurements: Goal: Respiratory complications will improve Outcome: Progressing   Problem: Clinical Measurements: Goal: Respiratory complications will improve Outcome: Progressing

## 2022-06-18 NOTE — Progress Notes (Signed)
Subjective:   Patient interviewed at bedside.  Denies chest pain or lower extremity pain.  Reports that his breathing feels better today compared to yesterday.  He does not wear oxygen at home.  He reports eating and going to the bathroom okay.  Discussed plan to continue with antibiotics and plan for VQ scan.   Objective:  Vital signs in last 24 hours: Vitals:   06/17/22 2355 06/18/22 0028 06/18/22 0500 06/18/22 0506  BP:  122/81  (!) 141/89  Pulse:  79  88  Resp:  20  19  Temp: 99.2 F (37.3 C) 98.6 F (37 C)  98.7 F (37.1 C)  TempSrc: Oral Oral  Oral  SpO2:  94%  97%  Weight:   115.3 kg    Physical Exam: Constitutional: Chronically ill-appearing, appears comfortable, talkative Cardiovascular: Regular rate, regular rhythm. No murmurs, rubs, or gallops. Normal radial and PT pulses bilaterally. No LE edema.  Pulmonary: Normal respiratory effort. No wheezes, rales, or rhonchi. Minimal crackles at bilateral bases.  Abdominal: Soft. Non-distended. No tenderness. Normal bowel sounds.  Musculoskeletal: Normal range of motion.     Neurological: Alert and oriented to person, place, and time. Non-focal. Skin: warm and dry.    Assessment/Plan:  Principal Problem:   Acute respiratory failure with hypoxia (HCC) Active Problems:   Mosaic Klinefelter syndrome   Methadone maintenance therapy patient ([place])   Type 2 diabetes mellitus ([place])   Hypertension associated with diabetes ([place])   Community acquired pneumonia   History of pulmonary embolus (PE)  Timothy [name] is a 52 year old male with PMH of mosaic Klinefelter syndrome, Y4IH, DVT complicated by PE, HTN, GAD, MDD, and GERD who presented with dyspnea on exertion and was admitted for further evaluation of dyspnea.     #Acute Hypoxic Respiratory Failure likely 2/2 CAP #Recent COVID infection Discharged on 1/10 for CAP in setting of COVID infection. Nasal swab was positive for MRSA. Treated w/ supplemental O2 (2-3L) and  ceftriaxone/azithromycin. Discharged on 2 days of cefdinir. Breathing worsened after returning home. Also presented w/ cough and fatigue. Hypoxic in the 80s on admission. VBG w/ pH 7.46, pCO2 34, and pO2 148. CXR w/ persistent bibasilar opacities consistent w/ pneumonia. Strep pneumonia urine antigen negative. No leukocytosis. Suspect CAP vs HAP given recent hospitalization. Minimal crackles on exam today. Afebrile and satting well on 2L [place]. Patient desaturated to 89% during ambulatory oxygen monitoring on 2L. Will continue to wean oxygen as tolerated. -IV Cefepime 2g q8 hours -IV Vancomycin 1.75g q12 hours -F/u sputum culture -Pending legionella urine Ag -Supplemental oxygen as necessary -ICS, flutter valve -Trend CBC, BMP  -PT eval   #History of pulmonary embolism secondary to DVT History of DVT w/ PE in 02/2021 after he stopped taking anticoagulation. Currently on rivaroxaban at home and reports compliance. Elevated d-dimer on admission but has anaphylactic reaction to iodine. No chest pain, lower extremity pain, or tachycardia. PE is less likely the cause of his worsening respiratory status, however, will perform VQ scan in place of CT PE study.   -Rivaroxaban 20mg  daily -F/u VQ scan  #Polysubstance use disorder #Opiate dependency Prior heroin use. Currently receives methadone from [facility].  -Methadone 70mg  daily   #Diabetes complicated by peripheral neuropathy A1c 8.3% in 06/2022. On dapagliflozin, metformin, and gabapentin at home.  -SSI, moderate sensitivity  -Dapagliflozin 10mg  daily -Gabapentin 600mg  q8 hours  #HTN #HLD On amlodipine and atorvastatin at home. BP remains WNL.  -Amlodipine 2.5mg  daily -Atorvastatin 20mg  daily   #MDD #GAD On  duloxetine at home.  -Duloxetine 60mg  daily   Diet: Regular Bowel: none VTE: Xarelto IVF: none Code: Full   Prior to Admission Living Arrangement: at home w/ parents Anticipated Discharge Location: TBD Barriers to Discharge:  continued management Dispo: Anticipated discharge in approximately less than 2 day(s).   Starlyn Skeans, MD 06/18/2022, 6:47 AM Pager: [phone] After 5pm on weekdays and 1pm on weekends: On Call pager [phone]

## 2022-06-18 NOTE — Progress Notes (Signed)
SATURATION QUALIFICATIONS: (This note is used to comply with regulatory documentation for home oxygen)  Patient Saturations on Room Air at Rest = 93%  Patient Saturations on Room Air while Ambulating = 89%  Patient Saturations on -- Liters of oxygen while Ambulating = --%  Please briefly explain why patient needs home oxygen: n/a  Stacie Glaze, PT DPT Acute Rehabilitation Services Pager [phone]  Office [phone]

## 2022-06-18 NOTE — Progress Notes (Signed)
Patient transfer from ED to floor ,alert and oriented,denies pain,patient assessment done,placed on telemetry,patient made comfortable in bed,mouth care and feet cleaned,call light in reach,will continue to monitor.

## 2022-06-18 NOTE — Plan of Care (Signed)
Problem: Education: Goal: Ability to describe self-care measures that may prevent or decrease complications (Diabetes Survival Skills Education) will improve Outcome: Progressing Goal: Individualized Educational Video(s) Outcome: Progressing   Problem: Coping: Goal: Ability to adjust to condition or change in health will improve Outcome: Progressing   Problem: Fluid Volume: Goal: Ability to maintain a balanced intake and output will improve Outcome: Progressing   Problem: Health Behavior/Discharge Planning: Goal: Ability to identify and utilize available resources and services will improve Outcome: Progressing Goal: Ability to manage health-related needs will improve Outcome: Progressing   Problem: Metabolic: Goal: Ability to maintain appropriate glucose levels will improve Outcome: Progressing   Problem: Nutritional: Goal: Maintenance of adequate nutrition will improve Outcome: Progressing Goal: Progress toward achieving an optimal weight will improve Outcome: Progressing   Problem: Skin Integrity: Goal: Risk for impaired skin integrity will decrease Outcome: Progressing   Problem: Tissue Perfusion: Goal: Adequacy of tissue perfusion will improve Outcome: Progressing   Problem: Education: Goal: Knowledge of General Education information will improve Description: Including pain rating scale, medication(s)/side effects and non-pharmacologic comfort measures Outcome: Progressing   Problem: Health Behavior/Discharge Planning: Goal: Ability to manage health-related needs will improve Outcome: Progressing   Problem: Clinical Measurements: Goal: Ability to maintain clinical measurements within normal limits will improve Outcome: Progressing Goal: Will remain free from infection Outcome: Progressing Goal: Diagnostic test results will improve Outcome: Progressing Goal: Respiratory complications will improve Outcome: Progressing Goal: Cardiovascular complication will  be avoided Outcome: Progressing   Problem: Activity: Goal: Risk for activity intolerance will decrease Outcome: Progressing   Problem: Nutrition: Goal: Adequate nutrition will be maintained Outcome: Progressing   Problem: Coping: Goal: Level of anxiety will decrease Outcome: Progressing   Problem: Elimination: Goal: Will not experience complications related to bowel motility Outcome: Progressing Goal: Will not experience complications related to urinary retention Outcome: Progressing   Problem: Pain Managment: Goal: General experience of comfort will improve Outcome: Progressing   Problem: Safety: Goal: Ability to remain free from injury will improve Outcome: Progressing   Problem: Skin Integrity: Goal: Risk for impaired skin integrity will decrease Outcome: Progressing   Problem: Clinical Measurements: Goal: Will remain free from infection Outcome: Progressing Goal: Respiratory complications will improve Outcome: Progressing   Problem: Skin Integrity: Goal: Risk for impaired skin integrity will decrease Outcome: Progressing   Problem: Health Behavior/Discharge Planning: Goal: Ability to manage health-related needs will improve Outcome: Progressing   Problem: Clinical Measurements: Goal: Respiratory complications will improve Outcome: Progressing   Problem: Activity: Goal: Risk for activity intolerance will decrease Outcome: Progressing   Problem: Skin Integrity: Goal: Risk for impaired skin integrity will decrease Outcome: Progressing   Problem: Education: Goal: Knowledge of General Education information will improve Description: Including pain rating scale, medication(s)/side effects and non-pharmacologic comfort measures Outcome: Progressing   Problem: Health Behavior/Discharge Planning: Goal: Ability to manage health-related needs will improve Outcome: Progressing   Problem: Clinical Measurements: Goal: Ability to maintain clinical measurements  within normal limits will improve Outcome: Progressing Goal: Will remain free from infection Outcome: Progressing Goal: Diagnostic test results will improve Outcome: Progressing Goal: Respiratory complications will improve Outcome: Progressing Goal: Cardiovascular complication will be avoided Outcome: Progressing   Problem: Activity: Goal: Risk for activity intolerance will decrease Outcome: Progressing   Problem: Nutrition: Goal: Adequate nutrition will be maintained Outcome: Progressing   Problem: Coping: Goal: Level of anxiety will decrease Outcome: Progressing   Problem: Elimination: Goal: Will not experience complications related to bowel motility Outcome: Progressing Goal: Will not experience complications related  to urinary retention Outcome: Progressing

## 2022-06-18 NOTE — Progress Notes (Signed)
30 mg/3 ml of liquid Methadone wasted in the steri cycle by myself witnessed by Corky Crafts, RN.

## 2022-06-18 NOTE — Evaluation (Signed)
Physical Therapy Evaluation Patient Details Name: Timothy [name] MRN: [id number] DOB: 1970/07/20 Today's Date: 06/18/2022  History of Present Illness  52 year old male adm 1/12 with a past medical history of mosaic Klinefelter syndrome, diabetes, deep vein thrombosis complicated by pulmonary embolism, hypertension, generalized anxiety disorder, major depressive disorder, and gastroesophageal reflux disease who presents with dyspnea on exertion. Workup for PNA, recent admission for covid PNa.  Clinical Impression   Pt presents with increased time and effort to mobilize, dyspnea on exertion with SPO2 maintained 89% and greater on RA, and decreased activity tolerance. Pt to benefit from acute PT to address deficits. Pt ambulated hallway distance x2, requiring seated rest break to recover fatigue. PT discussed pt need for portable pulse ox for home use to self-monitor SPO2, frequent mobility once d/c home, pt expresses understanding. PT to progress mobility as tolerated, and will continue to follow acutely.          Recommendations for follow up therapy are one component of a multi-disciplinary discharge planning process, led by the attending physician.  Recommendations may be updated based on patient status, additional functional criteria and insurance authorization.  Follow Up Recommendations No PT follow up      Assistance Recommended at Discharge PRN  Patient can return home with the following       Equipment Recommendations None recommended by PT  Recommendations for Other Services       Functional Status Assessment Patient has had a recent decline in their functional status and demonstrates the ability to make significant improvements in function in a reasonable and predictable amount of time.     Precautions / Restrictions Precautions Precautions: Other (comment) Precaution Comments: O2 sats Restrictions Weight Bearing Restrictions: No      Mobility  Bed Mobility Overal  bed mobility: Needs Assistance             General bed mobility comments: up in chair    Transfers Overall transfer level: Needs assistance Equipment used: None Transfers: Sit to/from Stand Sit to Stand: Supervision           General transfer comment: safety; slow to rise    Ambulation/Gait Ambulation/Gait assistance: Supervision Gait Distance (Feet): 80 Feet (x2 with 3 min seated rest break) Assistive device: None Gait Pattern/deviations: Step-through pattern, Decreased stride length, Wide base of support Gait velocity: decr     General Gait Details: for safety, slowed steps with DOE 2/4, SPO2 89% and greater on RA  Stairs            Wheelchair Mobility    Modified Rankin (Stroke Patients Only)       Balance Overall balance assessment: Mild deficits observed, not formally tested                                           Pertinent Vitals/Pain Pain Assessment Pain Assessment: No/denies pain    Home Living Family/patient expects to be discharged to:: Private residence Living Arrangements: Parent Available Help at Discharge: Family Type of Home: House Home Access: Ramped entrance;Stairs to enter Entrance Stairs-Rails: Right Entrance Stairs-Number of Steps: 4 at side to enter   Home Layout: One level Home Equipment: None Additional Comments: Stays with his parents and assists with there care.  Both parents have dementia.  Mom is mobile, father is bedbound    Prior Function Prior Level of Function : Independent/Modified Independent;Driving  Hand Dominance   Dominant Hand: Right    Extremity/Trunk Assessment   Upper Extremity Assessment Upper Extremity Assessment: Defer to OT evaluation    Lower Extremity Assessment Lower Extremity Assessment: Generalized weakness    Cervical / Trunk Assessment Cervical / Trunk Assessment: Kyphotic;Other exceptions Cervical / Trunk Exceptions: rounded  shoulders  Communication   Communication: No difficulties  Cognition Arousal/Alertness: Awake/alert Behavior During Therapy: WFL for tasks assessed/performed Overall Cognitive Status: Within Functional Limits for tasks assessed                                          General Comments      Exercises     Assessment/Plan    PT Assessment Patient needs continued PT services  PT Problem List Decreased mobility;Decreased activity tolerance;Decreased knowledge of use of DME;Decreased strength;Cardiopulmonary status limiting activity       PT Treatment Interventions Therapeutic activities;Gait training;Therapeutic exercise;Patient/family education;Balance training;Stair training;Functional mobility training    PT Goals (Current goals can be found in the Care Plan section)  Acute Rehab PT Goals PT Goal Formulation: With patient Time For Goal Achievement: 07/02/22 Potential to Achieve Goals: Good    Frequency Min 3X/week     Co-evaluation               AM-PAC PT "6 Clicks" Mobility  Outcome Measure Help needed turning from your back to your side while in a flat bed without using bedrails?: None Help needed moving from lying on your back to sitting on the side of a flat bed without using bedrails?: None Help needed moving to and from a bed to a chair (including a wheelchair)?: None Help needed standing up from a chair using your arms (e.g., wheelchair or bedside chair)?: None Help needed to walk in hospital room?: A Little Help needed climbing 3-5 steps with a railing? : A Little 6 Click Score: 22    End of Session   Activity Tolerance: Patient tolerated treatment well Patient left: in chair;with call bell/phone within reach Nurse Communication: Mobility status PT Visit Diagnosis: Other abnormalities of gait and mobility (R26.89);Muscle weakness (generalized) (M62.81)    Time: 1610-9604 PT Time Calculation (min) (ACUTE ONLY): 21 min   Charges:    PT Evaluation $PT Eval Low Complexity: 1 Low        Helene Bernstein S, PT DPT Acute Rehabilitation Services Pager [phone]  Office [phone]  [place] E Ruffin Pyo 06/18/2022, 1:29 PM

## 2022-06-19 DIAGNOSIS — J189 Pneumonia, unspecified organism: Secondary | ICD-10-CM | POA: Diagnosis not present

## 2022-06-19 DIAGNOSIS — J9601 Acute respiratory failure with hypoxia: Secondary | ICD-10-CM | POA: Diagnosis not present

## 2022-06-19 DIAGNOSIS — Z87891 Personal history of nicotine dependence: Secondary | ICD-10-CM | POA: Diagnosis not present

## 2022-06-19 LAB — BASIC METABOLIC PANEL
Anion gap: 9 (ref 5–15)
BUN: 14 mg/dL (ref 6–20)
CO2: 24 mmol/L (ref 22–32)
Calcium: 9 mg/dL (ref 8.9–10.3)
Chloride: 105 mmol/L (ref 98–111)
Creatinine, Ser: 0.76 mg/dL (ref 0.61–1.24)
GFR, Estimated: >60 mL/min (ref >60)
Glucose, Bld: 105 mg/dL — ABNORMAL HIGH (ref 70–99)
Potassium: 4.4 mmol/L (ref 3.5–5.1)
Sodium: 138 mmol/L (ref 135–145)

## 2022-06-19 LAB — CBC
HCT: 40.7 % (ref 39.0–52.0)
Hemoglobin: 12.7 g/dL — ABNORMAL LOW (ref 13.0–17.0)
MCH: 28.3 pg (ref 26.0–34.0)
MCHC: 31.2 g/dL (ref 30.0–36.0)
MCV: 90.6 fL (ref 80.0–100.0)
Platelets: 334 10*3/uL (ref 150–400)
RBC: 4.49 MIL/uL (ref 4.22–5.81)
RDW: 15.9 % — ABNORMAL HIGH (ref 11.5–15.5)
WBC: 5.2 10*3/uL (ref 4.0–10.5)
nRBC: 0 % (ref 0.0–0.2)

## 2022-06-19 LAB — GLUCOSE, CAPILLARY
Glucose-Capillary: 164 mg/dL — ABNORMAL HIGH (ref 70–99)
Glucose-Capillary: 125 mg/dL — ABNORMAL HIGH (ref 70–99)
Glucose-Capillary: 160 mg/dL — ABNORMAL HIGH (ref 70–99)

## 2022-06-19 MED ORDER — AMOXICILLIN-POT CLAVULANATE 500-125 MG PO TABS
1.0000 | ORAL_TABLET | Freq: Three times a day (TID) | ORAL | 0 refills | Status: DC
  Filled 2022-06-19: qty 15, 5d supply, fill #0

## 2022-06-19 NOTE — Discharge Instructions (Addendum)
You were hospitalized for worsening shortness of breath likely due to pneumonia.  Hospital Course: We gave you antibiotics for your pneumonia. We weaned you off of oxygen. You were able to walk well while maintaining good oxygen saturation. We will send you home on antibiotics for 5 days (Augmentin).  Medications:  Please start taking: -Augmentin 500-125 mg (please take one tablet 3 times daily for 5 days) for your pneumonia  Please continue taking: -Amlodipine 2.5 mg daily -Atorvastatin 20 mg daily -Duloxetine 60 mg daily -EpiPen for allergic reaction -Farxiga 10 mg daily -Gabapentin 300 mg (2 capsules (600 mg total) by mouth 3 times daily) -Metformin 1000 mg twice daily -Methadone 10 mg solution (70 mg daily) -Mucinex 600 mg twice daily -Omeprazole 40 mg daily -Semaglutide 0.25 mg once weekly -Prednisone 20 mg daily -Sertraline 100 mg daily -Xarelto 20 mg daily  Follow-up: - Please follow up with your primary care provider Dr. Charlott Rakes within the next 1-2 weeks (please call to schedule an appointment)

## 2022-06-19 NOTE — Progress Notes (Signed)
30 mg of methadone wasted in narcotic waste bin. Waste witnessed by Samule Dry, RN. Pt received remaining 70 mg per order.

## 2022-06-19 NOTE — Progress Notes (Signed)
Nurse requested Mobility Specialist to perform oxygen saturation test with pt which includes removing pt from oxygen both at rest and while ambulating.  Below are the results from that testing.    Patient Saturations on Room Air at Rest = spO2 97%  Patient Saturations on Room Air while Ambulating = sp02 90% .  At end of testing pt left in room on Room Air.  Reported results to nurse.    Andrey Campanile Mobility Specialist Please contact via SecureChat or  Rehab office at [phone]

## 2022-06-19 NOTE — TOC Transition Note (Signed)
Transition of Care [facility]) - CM/SW Discharge Note   Patient Details  Name: Timothy [name] MRN: [id number] Date of Birth: 1970/09/12  [facility]) CM/SW Contact:  Bartholomew Crews, RN Phone Number: [phone] 06/19/2022, 1:06 PM   Clinical Narrative:     Patient to transition home today. Chart reviewed. No TOC needs identified at this time.   Final next level of care: Home/Self Care Barriers to Discharge: No Barriers Identified   Patient Goals and CMS Choice      Discharge Placement                         Discharge Plan and Services Additional resources added to the After Visit Summary for                                       Social Determinants of Health (SDOH) Interventions SDOH Screenings   Depression (PHQ2-9): High Risk (04/12/2022)  Tobacco Use: Medium Risk (06/13/2022)     Readmission Risk Interventions     No data to display

## 2022-06-19 NOTE — Discharge Summary (Signed)
Name: Timothy [name] MRN: [id number] DOB: 04-19-1971 52 y.o. PCP: Charlott Rakes, MD  Date of Admission: 06/17/2022  3:29 PM Date of Discharge: 06/19/2022 Attending Physician: Sid Falcon, MD  Discharge Diagnosis: 1. Principal Problem:   Acute respiratory failure with hypoxia (HCC) Active Problems:   Mosaic Klinefelter syndrome   Methadone maintenance therapy patient ([place])   Type 2 diabetes mellitus ([place])   Hypertension associated with diabetes ([place])   Community acquired pneumonia   History of pulmonary embolus (PE) Recent Covid infection Hyperlipidemia Major Depressive Disorder Generalized Anxiety Disorder  Discharge Medications: Allergies as of 06/19/2022       Reactions   Beef-derived Products Anaphylaxis   Alpha Gal.   Food Anaphylaxis   All mammalian meat - anaphylaxis (alpha gal)   Ioxaglate Shortness Of Breath, Other (See Comments)   Dizziness    Ivp Dye [iodinated Contrast Media] Shortness Of Breath, Other (See Comments)   Dizziness    Pork-derived Products Anaphylaxis   Alpha Gal.   Shellfish Allergy Swelling   Swelling of face/mouth. No airway restriction.        Medication List     TAKE these medications    amLODipine 2.5 MG tablet Commonly known as: NORVASC Take 1 tablet (2.5 mg total) by mouth daily.   amoxicillin-clavulanate 500-125 MG tablet Commonly known as: Augmentin Take 1 tablet by mouth 3 (three) times daily.   atorvastatin 20 MG tablet Commonly known as: LIPITOR Take 1 tablet (20 mg total) by mouth daily.   DULoxetine 60 MG capsule Commonly known as: Cymbalta Take 1 capsule (60 mg total) by mouth daily.   EPINEPHrine 0.3 mg/0.3 mL Soaj injection Commonly known as: EpiPen 2-Pak Inject 0.3 mLs (0.3 mg total) into the muscle once as needed for up to 1 dose (for severe allergic reaction). CAll 911 immediately if you have to use this medicine   Farxiga 10 MG Tabs tablet Generic drug: dapagliflozin propanediol Take 1 tablet  (10 mg total) by mouth daily before breakfast.   gabapentin 300 MG capsule Commonly known as: NEURONTIN Take 2 capsules (600 mg total) by mouth 3 (three) times daily.   metFORMIN 1000 MG tablet Commonly known as: GLUCOPHAGE Take 1 tablet (1,000 mg total) by mouth 2 (two) times daily with a meal.   methadone 10 MG/ML solution Commonly known as: DOLOPHINE Take 70 mg by mouth daily.   Mucus Relief 600 MG 12 hr tablet Generic drug: guaiFENesin Take 1 tablet (600 mg total) by mouth 2 (two) times daily.   omeprazole 40 MG capsule Commonly known as: PRILOSEC Take 1 capsule (40 mg total) by mouth daily.   Ozempic (0.25 or 0.5 MG/DOSE) 2 MG/3ML Sopn Generic drug: Semaglutide(0.25 or 0.5MG /DOS) Inject 0.25 mg into the skin once a week. What changed:  when to take this additional instructions   predniSONE 20 MG tablet Commonly known as: DELTASONE Take 1 tablet (20 mg total) by mouth daily with breakfast.   sertraline 100 MG tablet Commonly known as: ZOLOFT Take 100 mg by mouth at bedtime.   True Metrix Blood Glucose Test test strip Generic drug: glucose blood Use 3 times daily   TRUEplus Lancets 28G Misc use to test blood sugar 3 (three) times daily before meals.   Xarelto 20 MG Tabs tablet Generic drug: rivaroxaban Take 1 tablet (20 mg total) by mouth daily with supper. What changed: when to take this        Disposition and follow-up:   Mr.Timothy [name] was discharged  from [facility] in Good condition.  At the hospital follow up visit please address:  1.    A. Acute Hypoxic Respiratory Failure likely 2/2 CAP   -Discharged on Augmentin TID for 5 days   2.  Labs / imaging needed at time of follow-up: CBC, BMP  3.  Pending labs/ test needing follow-up: legionella urine Ag  Follow-up Appointments: - Please follow up with your primary care provider Dr. Hoy Register within the next 1-2 weeks (please call to schedule an appointment)  Hospital  Course by problem list:  Timothy [name] is a 52 year old male with PMH of mosaic Klinefelter syndrome, T2DM, DVT complicated by PE, HTN, GAD, MDD, and GERD who presented with dyspnea on exertion and was admitted for further evaluation of dyspnea.     #Acute Hypoxic Respiratory Failure likely 2/2 CAP #Recent COVID infection Recently discharged on 1/10 for CAP w/ covid infection. MRSA swab was positive. Was discharged on 2 days of cefdinir but breathing worsened at home. Presented w/ cough and fatigue. Hypoxic on admission. CXR w/ persistent bibasilar opacities consistent w/ pneumonia. No leukocytosis. Suspect CAP vs HAP given recent hospitalization. Treated with IV vanc/cefepime. Weaned off of supplemental oxygen. Discharged with 5-day course of augmentin.    #History of pulmonary embolism secondary to DVT DVT w/ PE in 02/2021 after stopping anticoagulation. Was taking rivaroxaban at home and reported compliance. D-dimer was elevated on admission. Prior anaphylactic reaction to iodine, therefore no CTA done. No chest pain, lower extremity pain, or tachycardia. Respiratory continued to improve. Low suspicion for PE therefore did not perform VQ scan. Continued home rivaroxaban 20 mg daily throughout his stay and at discharge.    #Polysubstance use disorder #Opiate dependency Prior heroin use. Receives methadone from [place]. Continued home Methadone 70mg  daily during his stay and at discharge.    #Diabetes complicated by peripheral neuropathy A1c 8.3% in 06/2022. On dapagliflozin, metformin, and gabapentin at home. Controlled with SSI while inpatient. CBGs were WNL. Discontinued on home dapagliflozin, metformin, and gabapentin.    #HTN #HLD On amlodipine 2.5 mg daily and atorvastain 20 mg daily at home. Continued these medications throughout his stay and at discharge.    #MDD #GAD Continued home duloxetine 60 mg daily throughout his stay and at discharge.    Discharge Exam:   BP 118/75 (BP  Location: Left Arm)   Pulse 75   Temp 98.6 F (37 C) (Oral)   Resp 17   Wt 115.3 kg   SpO2 95%   BMI 30.94 kg/m  Constitutional: Chronically ill-appearing, appears comfortable, talkative Cardiovascular: Regular rate, regular rhythm. No murmurs, rubs, or gallops. No LE edema.  Pulmonary: Normal respiratory effort. No wheezes, rales, rhonchi, or crackles.  Abdominal: Soft. Non-distended. No tenderness. Normal bowel sounds.  Musculoskeletal: Normal range of motion.     Neurological: Alert and oriented to person, place, and time. Non-focal. Skin: warm and dry.   Pertinent Labs, Studies, and Procedures:     Latest Ref Rng & Units 06/19/2022    2:31 AM 06/18/2022    2:48 AM 06/17/2022    9:31 PM  CBC  WBC 4.0 - 10.5 K/uL 5.2  6.9    Hemoglobin 13.0 - 17.0 g/dL 08/16/2022  53.6  64.4   Hematocrit 39.0 - 52.0 % 40.7  39.9  40.0   Platelets 150 - 400 K/uL 334  293         Latest Ref Rng & Units 06/19/2022    2:31 AM 06/18/2022  2:48 AM 06/17/2022    9:31 PM  BMP  Glucose 70 - 99 mg/dL 105  101    BUN 6 - 20 mg/dL 14  11    Creatinine 0.61 - 1.24 mg/dL 0.76  0.75    Sodium 135 - 145 mmol/L 138  138  140   Potassium 3.5 - 5.1 mmol/L 4.4  3.7  3.8   Chloride 98 - 111 mmol/L 105  103    CO2 22 - 32 mmol/L 24  24    Calcium 8.9 - 10.3 mg/dL 9.0  8.8      DG Chest Port 1 View  IMPRESSION: Persistent bibasilar airspace opacities, suspicious for pneumonia. Recommend continued imaging follow-up to resolution.   On: 06/17/2022 16:39  Discharge Instructions: Discharge Instructions     Call MD for:  difficulty breathing, headache or visual disturbances   Complete by: As directed    Call MD for:  extreme fatigue   Complete by: As directed    Call MD for:  persistant dizziness or light-headedness   Complete by: As directed    Call MD for:  temperature >100.4   Complete by: As directed    Diet - low sodium heart healthy   Complete by: As directed    Increase activity slowly   Complete  by: As directed       You were hospitalized for worsening shortness of breath likely due to pneumonia.   Hospital Course: We gave you antibiotics for your pneumonia. We weaned you off of oxygen. You were able to walk well while maintaining good oxygen saturation. We will send you home on antibiotics for 5 days (Augmentin).   Medications:   Please start taking: -Augmentin 500-125 mg (please take one tablet 3 times daily for 5 days) for your pneumonia   Please continue taking: -Amlodipine 2.5 mg daily -Atorvastatin 20 mg daily -Duloxetine 60 mg daily -EpiPen for allergic reaction -Farxiga 10 mg daily -Gabapentin 300 mg (2 capsules (600 mg total) by mouth 3 times daily) -Metformin 1000 mg twice daily -Methadone 10 mg solution (70 mg daily) -Mucinex 600 mg twice daily -Omeprazole 40 mg daily -Semaglutide 0.25 mg once weekly -Prednisone 20 mg daily -Sertraline 100 mg daily -Xarelto 20 mg daily   Follow-up: - Please follow up with your primary care provider Dr. Charlott Rakes within the next 1-2 weeks (please call to schedule an appointment)  Signed: Starlyn Skeans, MD 06/19/2022, 3:18 PM   Pager: [phone]

## 2022-06-19 NOTE — Progress Notes (Signed)
Mobility Specialist Progress Note:   06/19/22 1051  Mobility  Activity Ambulated with assistance in hallway  Level of Assistance Standby assist, set-up cues, supervision of patient - no hands on  Assistive Device Other (Comment) (IV Pole)  Distance Ambulated (ft) 100 ft  Activity Response Tolerated well  Mobility Referral Yes  $Mobility charge 1 Mobility   Pt received in bed and agreeable. No complaints. Took ambulatory O2 Sats per RN request. Pt left in bed with all needs met and call bell in reach.   Andrey Campanile Mobility Specialist Please contact via SecureChat or  Rehab office at [phone]

## 2022-06-20 ENCOUNTER — Other Ambulatory Visit: Payer: Self-pay

## 2022-06-20 ENCOUNTER — Telehealth: Payer: Self-pay

## 2022-06-20 LAB — CULTURE, RESPIRATORY W GRAM STAIN

## 2022-06-20 LAB — LEGIONELLA PNEUMOPHILA SEROGP 1 UR AG: L. pneumophila Serogp 1 Ur Ag: NEGATIVE

## 2022-06-20 NOTE — Telephone Encounter (Signed)
Transition Care Management Unsuccessful Follow-up Telephone Call  Date of discharge and from where:  06/19/2022, [facility]  Attempts:  1st Attempt  Reason for unsuccessful TCM follow-up call:  Left voice message [phone], call back requested.  Need to schedule a hospital follow up appointment

## 2022-06-21 ENCOUNTER — Other Ambulatory Visit: Payer: Self-pay

## 2022-06-21 ENCOUNTER — Telehealth: Payer: Self-pay

## 2022-06-21 ENCOUNTER — Other Ambulatory Visit: Payer: Self-pay | Admitting: Family Medicine

## 2022-06-21 ENCOUNTER — Inpatient Hospital Stay: Payer: Medicaid Other | Admitting: Family Medicine

## 2022-06-21 DIAGNOSIS — E1169 Type 2 diabetes mellitus with other specified complication: Secondary | ICD-10-CM

## 2022-06-21 MED ORDER — DAPAGLIFLOZIN PROPANEDIOL 10 MG PO TABS
10.0000 mg | ORAL_TABLET | Freq: Every day | ORAL | 6 refills | Status: DC
  Filled 2022-06-21: qty 90, 90d supply, fill #0
  Filled 2022-09-19 – 2022-10-03 (×4): qty 90, 90d supply, fill #1
  Filled 2023-01-10 – 2023-05-05 (×6): qty 30, 30d supply, fill #2

## 2022-06-21 NOTE — Telephone Encounter (Signed)
Requested Prescriptions  Pending Prescriptions Disp Refills   dapagliflozin propanediol (FARXIGA) 10 MG TABS tablet 30 tablet 6    Sig: Take 1 tablet (10 mg total) by mouth daily before breakfast.     Endocrinology:  Diabetes - SGLT2 Inhibitors Failed - 06/21/2022  8:30 AM      Failed - HBA1C is between 0 and 7.9 and within 180 days    HbA1c, POC (controlled diabetic range)  Date Value Ref Range Status  04/12/2022 6.8 0.0 - 7.0 % Final   Hgb A1c MFr Bld  Date Value Ref Range Status  06/13/2022 8.3 (H) 4.8 - 5.6 % Final    Comment:    (NOTE) Pre diabetes:          5.7%-6.4%  Diabetes:              >6.4%  Glycemic control for   <7.0% adults with diabetes          Passed - Cr in normal range and within 360 days    Creatinine, Ser  Date Value Ref Range Status  06/19/2022 0.76 0.61 - 1.24 mg/dL Final         Passed - eGFR in normal range and within 360 days    GFR calc Af Amer  Date Value Ref Range Status  12/03/2018 >60 >60 mL/min Final   GFR, Estimated  Date Value Ref Range Status  06/19/2022 >60 >60 mL/min Final    Comment:    (NOTE) Calculated using the CKD-EPI Creatinine Equation (2021)    eGFR  Date Value Ref Range Status  10/19/2021 107 >59 mL/min/1.73 Final         Passed - Valid encounter within last 6 months    Recent Outpatient Visits           2 months ago Type 2 diabetes mellitus with other specified complication, without long-term current use of insulin (HCC)   [place], Charlo, MD   6 months ago Type 2 diabetes mellitus with other specified complication, without long-term current use of insulin ([place])   [place], Pajaros, MD   8 months ago Type 2 diabetes mellitus with other specified complication, without long-term current use of insulin ([place])   [place], Enobong, MD   10 years ago Chest pain   [facility],  Janalee Dane, MD       Future Appointments             In 3 weeks Charlott Rakes, MD [place]   In 3 months Charlott Rakes, MD [place]

## 2022-06-21 NOTE — Telephone Encounter (Signed)
Transition Care Management Unsuccessful Follow-up Telephone Call  Date of discharge and from where:  06/19/2022, [facility]  Attempts:  2nd Attempt  Reason for unsuccessful TCM follow-up call:  Left voice message -[phone], call back requested.   Patient has a hospital follow up appointment with Dr Margarita Rana - 07/13/2022

## 2022-06-22 ENCOUNTER — Other Ambulatory Visit: Payer: Self-pay

## 2022-06-22 ENCOUNTER — Telehealth: Payer: Self-pay

## 2022-06-22 NOTE — Telephone Encounter (Signed)
Transition Care Management Unsuccessful Follow-up Telephone Call  Date of discharge and from where:  06/19/2022, [facility]  Attempts:  3rd Attempt  Reason for unsuccessful TCM follow-up call:  Left voice message  [phone], call back requested.    Patient has a hospital follow up appointment with Dr Margarita Rana - 07/13/2022

## 2022-07-12 ENCOUNTER — Encounter: Payer: Self-pay | Admitting: Family Medicine

## 2022-07-12 IMAGING — US US EXTREM LOW VENOUS*R*
1 series · 13 of 24 positions shown · non-contrast
Comparison: None.

CLINICAL DATA: Right lower extremity pain and edema



[Series 1: us venous img lower uni right (dvt) · portal-venous · 13 of 41 slices shown]
[im 1/41]
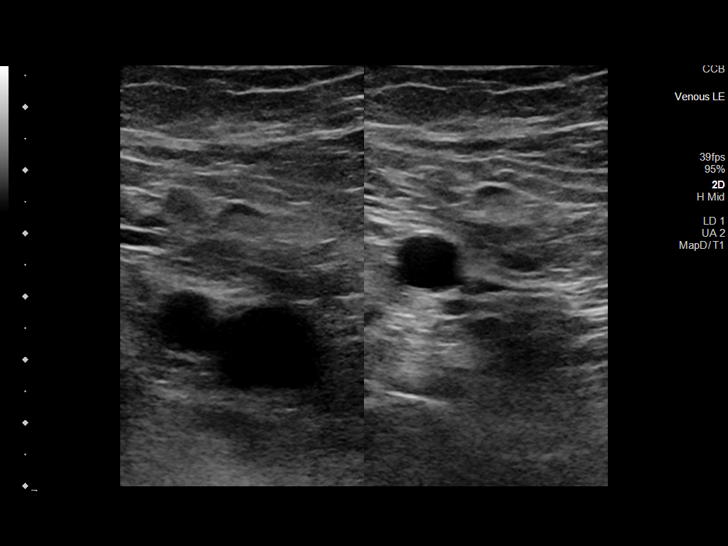
[im 4/41]
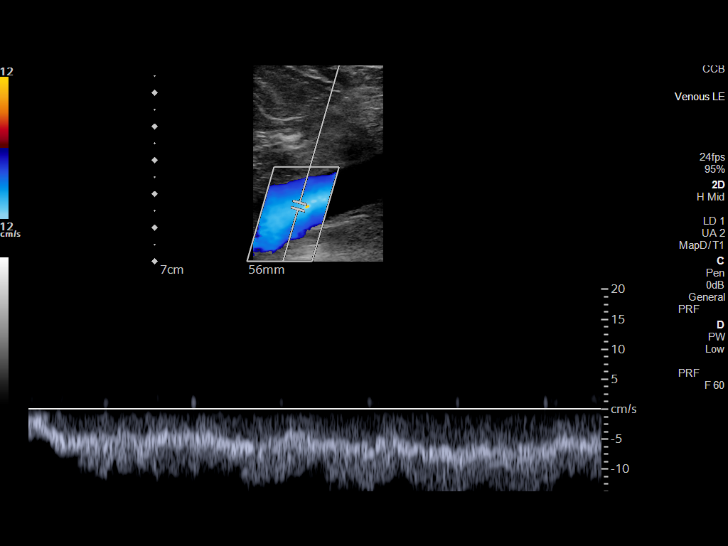
[im 7/41]
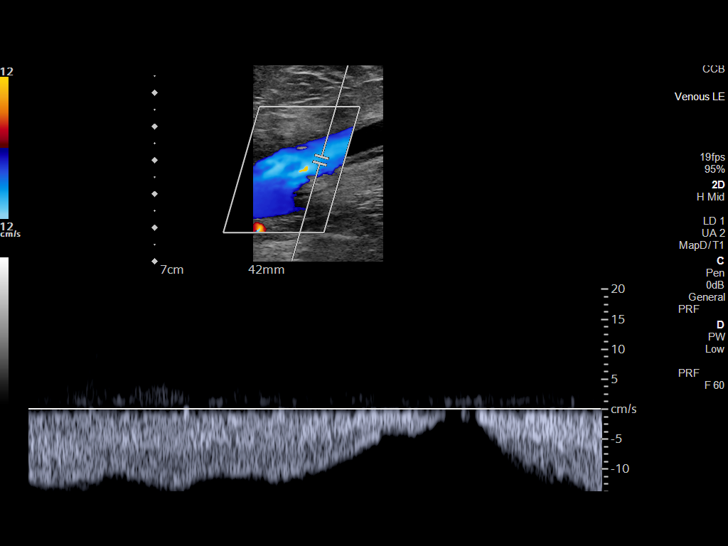
[im 11/41]
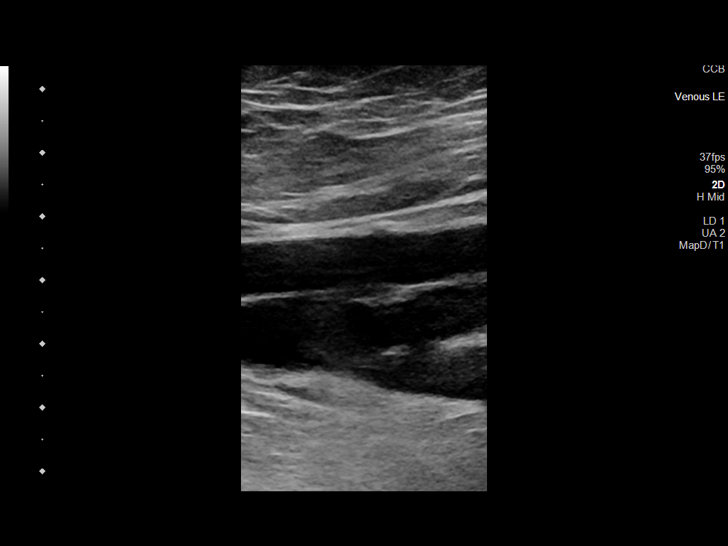
[im 14/41]
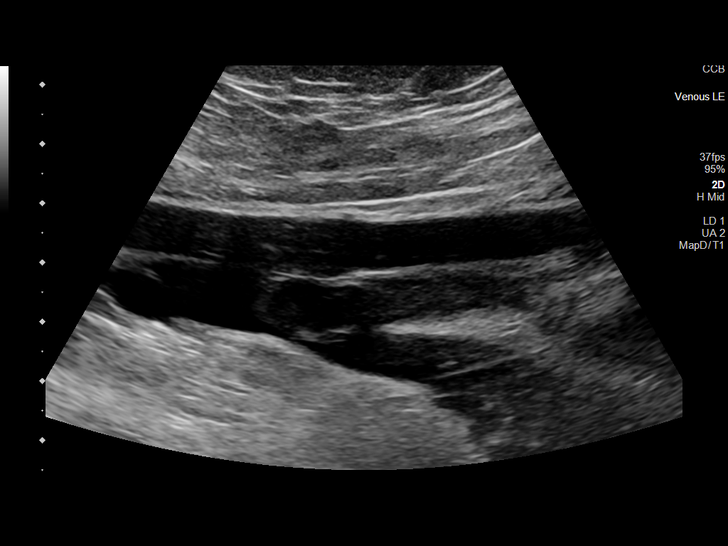
[im 18/41]
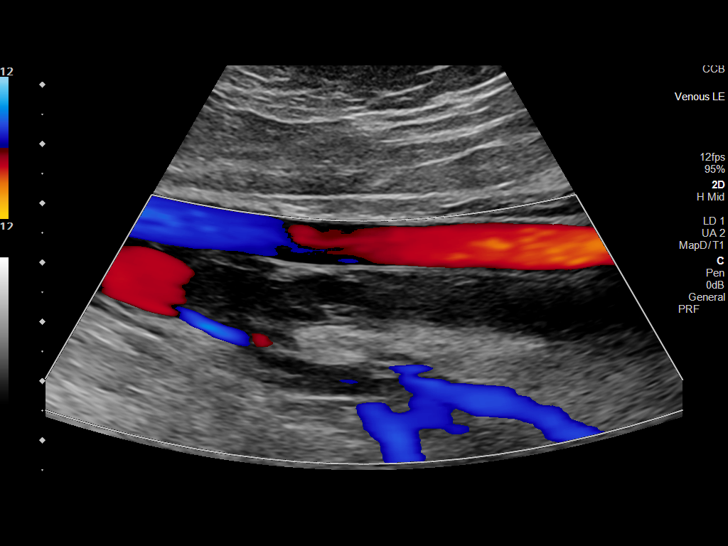
[im 21/41]
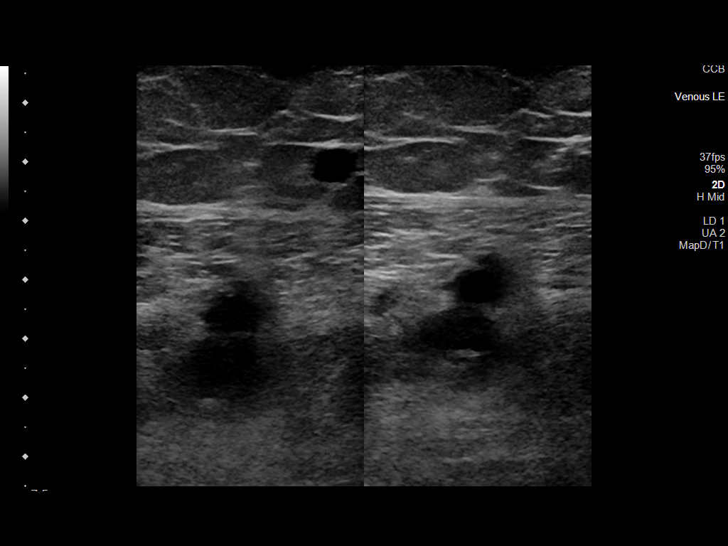
[im 23/41]
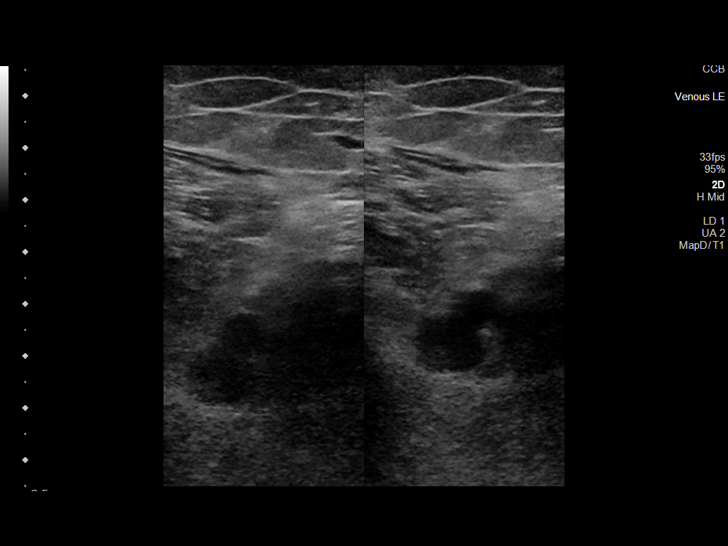
[im 27/41]
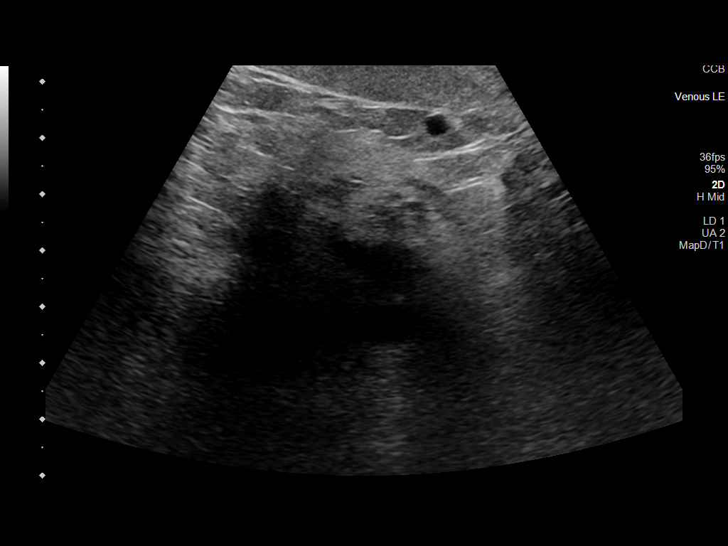
[im 30/41]
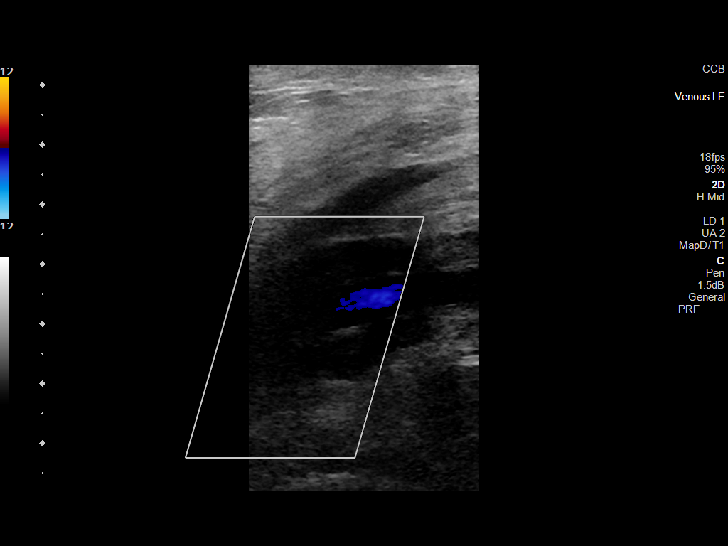
[im 34/41]
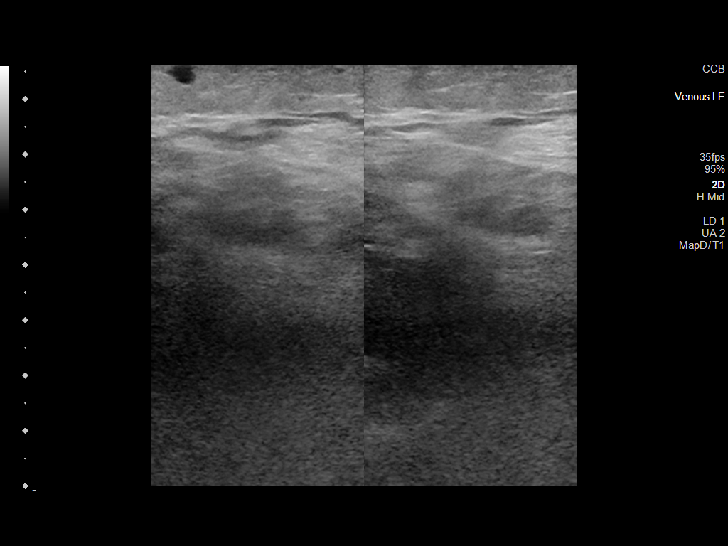
[im 37/41]
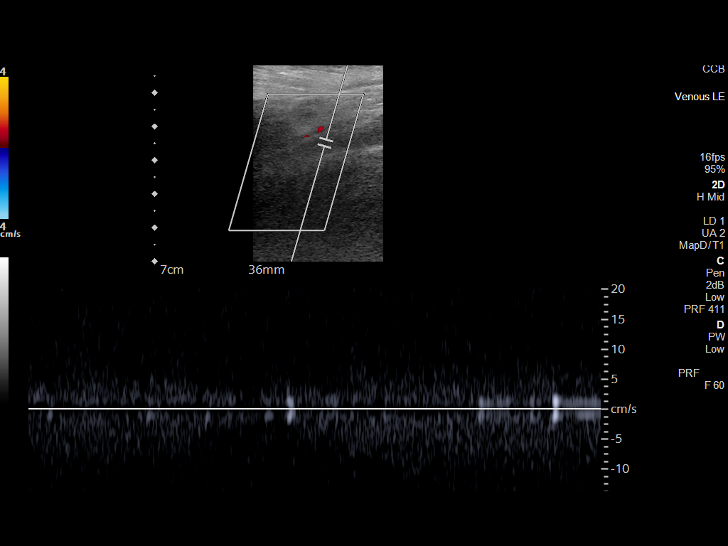
[im 41/41]
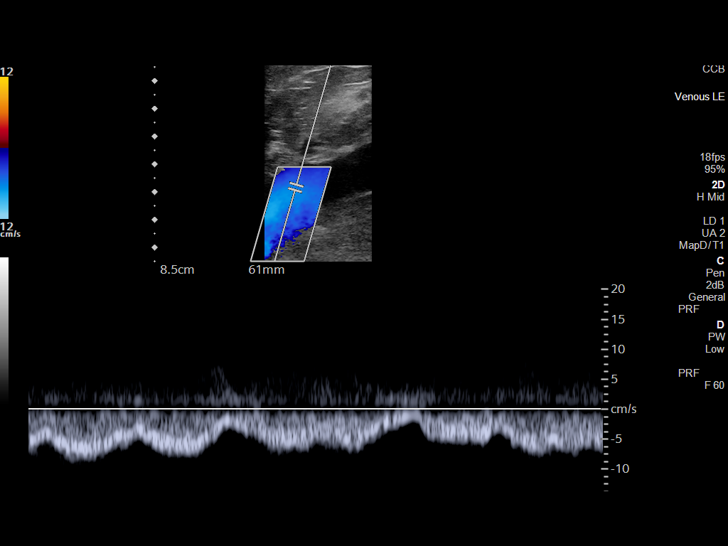

[13 of 24 positions shown; findings below may reference images not displayed]

FINDINGS: Contralateral Common Femoral Vein: Respiratory phasicity is normal
and symmetric with the symptomatic side. No evidence of thrombus.
Normal compressibility.

Common Femoral Vein: No evidence of thrombus. Normal
compressibility, respiratory phasicity and response to augmentation.

Saphenofemoral Junction: No evidence of thrombus. Normal
compressibility and flow on color Doppler imaging.

Profunda Femoral Vein: No evidence of thrombus. Normal
compressibility and flow on color Doppler imaging.

Femoral Vein: Diffuse mixed echogenicity intraluminal thrombus
throughout the femoral vein. Thrombus appears occlusive. Vessel
noncompressible. No phasic flow appreciated.

Popliteal Vein: Mixed echogenicity thrombus appears to extend into
the popliteal vein. Vessel noncompressible. Thrombus appears
occlusive.

Calf Veins: Very limited visualization of the calf tibial and
peroneal veins.
IMPRESSION: Positive exam for right femoropopliteal occlusive DVT.

## 2022-07-13 ENCOUNTER — Inpatient Hospital Stay: Payer: Medicaid Other | Admitting: Family Medicine

## 2022-07-13 ENCOUNTER — Other Ambulatory Visit: Payer: Self-pay | Admitting: Family Medicine

## 2022-07-13 DIAGNOSIS — E1169 Type 2 diabetes mellitus with other specified complication: Secondary | ICD-10-CM

## 2022-07-14 ENCOUNTER — Other Ambulatory Visit: Payer: Self-pay

## 2022-07-14 MED ORDER — METFORMIN HCL 1000 MG PO TABS
1000.0000 mg | ORAL_TABLET | Freq: Two times a day (BID) | ORAL | 0 refills | Status: DC
  Filled 2022-07-14 – 2022-08-03 (×2): qty 180, 90d supply, fill #0

## 2022-07-17 ENCOUNTER — Other Ambulatory Visit: Payer: Self-pay | Admitting: Family Medicine

## 2022-07-18 ENCOUNTER — Other Ambulatory Visit: Payer: Self-pay

## 2022-07-18 MED ORDER — OMEPRAZOLE 40 MG PO CPDR
40.0000 mg | DELAYED_RELEASE_CAPSULE | Freq: Every day | ORAL | 0 refills | Status: DC
  Filled 2022-07-18 – 2022-07-20 (×2): qty 30, 30d supply, fill #0
  Filled 2022-08-28: qty 30, 30d supply, fill #1
  Filled 2022-10-01: qty 30, 30d supply, fill #2

## 2022-07-18 NOTE — Telephone Encounter (Signed)
Requested Prescriptions  Pending Prescriptions Disp Refills   omeprazole (PRILOSEC) 40 MG capsule 90 capsule 0    Sig: Take 1 capsule (40 mg total) by mouth daily.     Gastroenterology: Proton Pump Inhibitors Passed - 07/17/2022 10:09 PM      Passed - Valid encounter within last 12 months    Recent Outpatient Visits           3 months ago Type 2 diabetes mellitus with other specified complication, without long-term current use of insulin ([place])   [place], [place], MD   6 months ago Type 2 diabetes mellitus with other specified complication, without long-term current use of insulin ([place])   [place], [place], MD   9 months ago Type 2 diabetes mellitus with other specified complication, without long-term current use of insulin ([place])   [place] Charlott Rakes, MD   10 years ago Chest pain   [facility], Janalee Dane, MD       Future Appointments             In 3 weeks Charlott Rakes, MD [place]   In 2 months Charlott Rakes, MD [place]

## 2022-07-20 ENCOUNTER — Other Ambulatory Visit: Payer: Self-pay

## 2022-07-21 ENCOUNTER — Other Ambulatory Visit: Payer: Self-pay

## 2022-07-25 ENCOUNTER — Other Ambulatory Visit: Payer: Self-pay

## 2022-07-27 ENCOUNTER — Other Ambulatory Visit: Payer: Self-pay

## 2022-07-28 ENCOUNTER — Other Ambulatory Visit (HOSPITAL_COMMUNITY): Payer: Self-pay

## 2022-07-28 ENCOUNTER — Other Ambulatory Visit: Payer: Self-pay

## 2022-08-03 ENCOUNTER — Other Ambulatory Visit: Payer: Self-pay

## 2022-08-04 ENCOUNTER — Other Ambulatory Visit: Payer: Self-pay

## 2022-08-10 ENCOUNTER — Inpatient Hospital Stay: Payer: Medicaid Other | Admitting: Family Medicine

## 2022-08-15 ENCOUNTER — Other Ambulatory Visit: Payer: Self-pay

## 2022-08-28 ENCOUNTER — Other Ambulatory Visit: Payer: Self-pay | Admitting: Family Medicine

## 2022-08-28 DIAGNOSIS — I2699 Other pulmonary embolism without acute cor pulmonale: Secondary | ICD-10-CM

## 2022-08-29 ENCOUNTER — Other Ambulatory Visit: Payer: Self-pay

## 2022-08-29 MED ORDER — RIVAROXABAN 20 MG PO TABS
20.0000 mg | ORAL_TABLET | Freq: Every day | ORAL | 1 refills | Status: DC
  Filled 2022-08-29: qty 30, 30d supply, fill #0
  Filled 2022-10-01: qty 30, 30d supply, fill #1

## 2022-08-31 ENCOUNTER — Other Ambulatory Visit: Payer: Self-pay

## 2022-09-20 ENCOUNTER — Other Ambulatory Visit: Payer: Self-pay

## 2022-09-27 ENCOUNTER — Other Ambulatory Visit: Payer: Self-pay

## 2022-09-28 ENCOUNTER — Other Ambulatory Visit: Payer: Self-pay

## 2022-10-01 ENCOUNTER — Other Ambulatory Visit: Payer: Self-pay | Admitting: Family Medicine

## 2022-10-01 DIAGNOSIS — F329 Major depressive disorder, single episode, unspecified: Secondary | ICD-10-CM

## 2022-10-01 DIAGNOSIS — G5603 Carpal tunnel syndrome, bilateral upper limbs: Secondary | ICD-10-CM

## 2022-10-01 DIAGNOSIS — E1169 Type 2 diabetes mellitus with other specified complication: Secondary | ICD-10-CM

## 2022-10-03 ENCOUNTER — Other Ambulatory Visit: Payer: Self-pay

## 2022-10-03 MED ORDER — ATORVASTATIN CALCIUM 20 MG PO TABS
20.0000 mg | ORAL_TABLET | Freq: Every day | ORAL | 0 refills | Status: DC
  Filled 2022-10-03: qty 90, 90d supply, fill #0

## 2022-10-03 MED ORDER — DULOXETINE HCL 60 MG PO CPEP
60.0000 mg | ORAL_CAPSULE | Freq: Every day | ORAL | 0 refills | Status: DC
  Filled 2022-10-03: qty 90, 90d supply, fill #0

## 2022-10-03 NOTE — Telephone Encounter (Signed)
Requested Prescriptions  Pending Prescriptions Disp Refills   atorvastatin (LIPITOR) 20 MG tablet 90 tablet 0    Sig: Take 1 tablet (20 mg total) by mouth daily.     Cardiovascular:  Antilipid - Statins Failed - 10/01/2022  9:13 PM      Failed - Lipid Panel in normal range within the last 12 months    Cholesterol, Total  Date Value Ref Range Status  02/17/2021 153 100 - 199 mg/dL Final   Cholesterol  Date Value Ref Range Status  06/13/2022 87 0 - 200 mg/dL Final   LDL Chol Calc (NIH)  Date Value Ref Range Status  02/17/2021 81 0 - 99 mg/dL Final   LDL Cholesterol  Date Value Ref Range Status  06/13/2022 48 0 - 99 mg/dL Final    Comment:           Total Cholesterol/HDL:CHD Risk Coronary Heart Disease Risk Table                     Men   Women  1/2 Average Risk   3.4   3.3  Average Risk       5.0   4.4  2 X Average Risk   9.6   7.1  3 X Average Risk  23.4   11.0        Use the calculated Patient Ratio above and the CHD Risk Table to determine the patient's CHD Risk.        ATP III CLASSIFICATION (LDL):  <100     mg/dL   Optimal  161-096  mg/dL   Near or Above                    Optimal  130-159  mg/dL   Borderline  045-409  mg/dL   High  >811     mg/dL   Very High Performed at [facility], [address] [place], [place] [address]    HDL  Date Value Ref Range Status  06/13/2022 16 (L) >40 mg/dL Final  29/56/2130 24 (L) >39 mg/dL Final   Triglycerides  Date Value Ref Range Status  06/13/2022 113 <150 mg/dL Final         Passed - Patient is not pregnant      Passed - Valid encounter within last 12 months    Recent Outpatient Visits           5 months ago Type 2 diabetes mellitus with other specified complication, without long-term current use of insulin (HCC)   [facility] [place], [place], MD   9 months ago Type 2 diabetes mellitus with other specified complication, without long-term current use of insulin (HCC)    [facility] [place], [place], MD   11 months ago Type 2 diabetes mellitus with other specified complication, without long-term current use of insulin (HCC)   [facility] Hoy Register, MD   10 years ago Chest pain   [facility], Tessa Lerner, MD       Future Appointments             In 1 week Hoy Register, MD [facility]             DULoxetine (CYMBALTA) 60 MG capsule 90 capsule 0    Sig: Take 1 capsule (60 mg total) by mouth daily.  Psychiatry: Antidepressants - SNRI - duloxetine Passed - 10/01/2022  9:13 PM      Passed - Cr in normal range and within 360 days    Creatinine, Ser  Date Value Ref Range Status  06/19/2022 0.76 0.61 - 1.24 mg/dL Final         Passed - eGFR is 30 or above and within 360 days    GFR calc Af Amer  Date Value Ref Range Status  12/03/2018 >60 >60 mL/min Final   GFR, Estimated  Date Value Ref Range Status  06/19/2022 >60 >60 mL/min Final    Comment:    (NOTE) Calculated using the CKD-EPI Creatinine Equation (2021)    eGFR  Date Value Ref Range Status  10/19/2021 107 >59 mL/min/1.73 Final         Passed - Completed PHQ-2 or PHQ-9 in the last 360 days      Passed - Last BP in normal range    BP Readings from Last 1 Encounters:  06/19/22 117/66         Passed - Valid encounter within last 6 months    Recent Outpatient Visits           5 months ago Type 2 diabetes mellitus with other specified complication, without long-term current use of insulin (HCC)   [facility] [place], Odette Horns, MD   9 months ago Type 2 diabetes mellitus with other specified complication, without long-term current use of insulin (HCC)   [place] [place], [place], MD   11 months ago Type 2 diabetes mellitus with other specified complication, without long-term  current use of insulin (HCC)   [place] [facility] Hoy Register, MD   10 years ago Chest pain   [facility], Tessa Lerner, MD       Future Appointments             In 1 week Hoy Register, MD [facility]

## 2022-10-06 ENCOUNTER — Other Ambulatory Visit: Payer: Self-pay

## 2022-10-11 ENCOUNTER — Ambulatory Visit: Payer: Medicaid Other | Admitting: Family Medicine

## 2022-10-13 ENCOUNTER — Encounter: Payer: Self-pay | Admitting: Family Medicine

## 2022-10-13 ENCOUNTER — Other Ambulatory Visit: Payer: Self-pay

## 2022-10-13 ENCOUNTER — Ambulatory Visit (INDEPENDENT_AMBULATORY_CARE_PROVIDER_SITE_OTHER): Payer: BLUE CROSS/BLUE SHIELD | Admitting: Family Medicine

## 2022-10-13 VITALS — BP 122/80 | HR 67 | Temp 98.9°F | Ht 76.0 in | Wt 257.0 lb

## 2022-10-13 DIAGNOSIS — Z7984 Long term (current) use of oral hypoglycemic drugs: Secondary | ICD-10-CM

## 2022-10-13 DIAGNOSIS — I152 Hypertension secondary to endocrine disorders: Secondary | ICD-10-CM

## 2022-10-13 DIAGNOSIS — Z1211 Encounter for screening for malignant neoplasm of colon: Secondary | ICD-10-CM

## 2022-10-13 DIAGNOSIS — F411 Generalized anxiety disorder: Secondary | ICD-10-CM

## 2022-10-13 DIAGNOSIS — E1169 Type 2 diabetes mellitus with other specified complication: Secondary | ICD-10-CM

## 2022-10-13 DIAGNOSIS — Z7689 Persons encountering health services in other specified circumstances: Secondary | ICD-10-CM

## 2022-10-13 DIAGNOSIS — E1159 Type 2 diabetes mellitus with other circulatory complications: Secondary | ICD-10-CM

## 2022-10-13 MED ORDER — OZEMPIC (0.25 OR 0.5 MG/DOSE) 2 MG/3ML ~~LOC~~ SOPN
0.5000 mg | PEN_INJECTOR | SUBCUTANEOUS | 6 refills | Status: DC
  Filled 2022-10-13 (×2): qty 3, 28d supply, fill #0

## 2022-10-13 MED ORDER — DULOXETINE HCL 30 MG PO CPEP
30.0000 mg | ORAL_CAPSULE | Freq: Every day | ORAL | 0 refills | Status: DC
  Filled 2022-10-13: qty 30, 30d supply, fill #0

## 2022-10-13 NOTE — Progress Notes (Signed)
New Patient Office Visit  Subjective    Patient ID: Timothy [name], male    DOB: [birth year]  Age: 52 y.o. MRN: [id number]  CC: No chief complaint on file.   HPI LEBRON HELFERICH presents to establish care. Oriented to practice routines and expectations. PMH includes DM2, Klinefelter syndrome, history of PE, recurrent DVTs, and heroin addiction on chronic methadone, anxiety, depression. His most recent A1c was 8.3 in January of this year. He has been on 0.25 mg weekly for several months of Ozempic without any side effects. He is taking Xarelto for his DVT. He was started on Amlodipine when he was hospitalized for pneumonia. His anxiety is uncontrolled off of Zoloft, it was ineffective. He has also tried Celexa.  DIABETES Hypoglycemic episodes:no Polydipsia/polyuria: yes Visual disturbance: yes Chest pain: no Paresthesias: yes Glucose Monitoring: yes  Accucheck frequency: Daily  Fasting glucose:  Post prandial:  Evening: 90-160  Before meals: Taking Insulin?: no  Long acting insulin:  Short acting insulin: Blood Pressure Monitoring: not checking Retinal Examination: Not up to Date Foot Exam: Not up to Date Diabetic Education: Completed Pneumovax: Not up to Date Influenza: Up to Date Aspirin: no     04/12/2022    3:54 PM 12/21/2021    2:31 PM 10/19/2021    2:27 PM  GAD 7 : Generalized Anxiety Score  Nervous, Anxious, on Edge 0 2 3  Control/stop worrying 1 2 3   Worry too much - different things 1 2 3   Trouble relaxing 1 2 0  Restless 0 2 1  Easily annoyed or irritable 3 3 3   Afraid - awful might happen 1 0 3  Total GAD 7 Score 7 13 16        Outpatient Encounter Medications as of 10/13/2022  Medication Sig   amLODipine (NORVASC) 2.5 MG tablet Take 1 tablet (2.5 mg total) by mouth daily.   atorvastatin (LIPITOR) 20 MG tablet Take 1 tablet (20 mg total) by mouth daily.   dapagliflozin propanediol (FARXIGA) 10 MG TABS tablet Take 1 tablet (10 mg total) by mouth daily  before breakfast.   DULoxetine (CYMBALTA) 30 MG capsule Take 1 capsule (30 mg total) by mouth daily.   DULoxetine (CYMBALTA) 60 MG capsule Take 1 capsule (60 mg total) by mouth daily.   EPINEPHrine (EPIPEN 2-PAK) 0.3 mg/0.3 mL IJ SOAJ injection Inject 0.3 mLs (0.3 mg total) into the muscle once as needed for up to 1 dose (for severe allergic reaction). CAll 911 immediately if you have to use this medicine   gabapentin (NEURONTIN) 300 MG capsule Take 2 capsules (600 mg total) by mouth 3 (three) times daily.   glucose blood (TRUE METRIX BLOOD GLUCOSE TEST) test strip Use 3 times daily   metFORMIN (GLUCOPHAGE) 1000 MG tablet Take 1 tablet (1,000 mg total) by mouth 2 (two) times daily with a meal.   methadone (DOLOPHINE) 10 MG/ML solution Take 70 mg by mouth daily.   omeprazole (PRILOSEC) 40 MG capsule Take 1 capsule (40 mg total) by mouth daily.   rivaroxaban (XARELTO) 20 MG TABS tablet Take 1 tablet (20 mg total) by mouth daily with supper.   TRUEplus Lancets 28G MISC use to test blood sugar 3 (three) times daily before meals.   [DISCONTINUED] Semaglutide,0.25 or 0.5MG /DOS, (OZEMPIC, 0.25 OR 0.5 MG/DOSE,) 2 MG/3ML SOPN Inject 0.25 mg into the skin once a week. (Patient taking differently: Inject 0.25 mg into the skin See admin instructions. 0.25 mg injected every Tuesday)   Semaglutide,0.25 or 0.5MG /DOS, (  OZEMPIC, 0.25 OR 0.5 MG/DOSE,) 2 MG/3ML SOPN Inject 0.5 mg into the skin once a week.   [DISCONTINUED] amoxicillin-clavulanate (AUGMENTIN) 500-125 MG tablet Take 1 tablet by mouth 3 (three) times daily. (Patient not taking: Reported on 10/13/2022)   [DISCONTINUED] guaiFENesin (MUCINEX) 600 MG 12 hr tablet Take 1 tablet (600 mg total) by mouth 2 (two) times daily. (Patient not taking: Reported on 10/13/2022)   [DISCONTINUED] predniSONE (DELTASONE) 20 MG tablet Take 1 tablet (20 mg total) by mouth daily with breakfast. (Patient not taking: Reported on 10/13/2022)   [DISCONTINUED] sertraline (ZOLOFT) 100 MG  tablet Take 100 mg by mouth at bedtime. (Patient not taking: Reported on 10/13/2022)   No facility-administered encounter medications on file as of 10/13/2022.    Past Medical History:  Diagnosis Date   Carpal tunnel syndrome on both sides 03/28/2013   Chronic headache    Diabetes mellitus without complication (HCC)    DVT (deep venous thrombosis) (HCC)    2017-2018: 4 dvts   GERD (gastroesophageal reflux disease)    Helicobacter pylori gastritis 06/30/2006   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   Pneumonia    Pulmonary embolism (HCC)    2013: 2 total   TIA (transient ischemic attack)     Past Surgical History:  Procedure Laterality Date   KNEE SURGERY Right 10/2016    Family History  Problem Relation Age of Onset   Barrett's esophagus Mother    Breast cancer Mother    Irritable bowel syndrome Brother    Allergies Brother    Diabetes Brother    Heart disease Father    Heart attack Father    Diabetes Sister    Diabetes Paternal Grandfather    Heart disease Paternal Grandfather    Heart disease Paternal Grandmother    Heart disease Maternal Grandmother    Heart disease Maternal Grandfather    Colon cancer Neg Hx    Esophageal cancer Neg Hx     Social History   Socioeconomic History   Marital status: Legally Separated    Spouse name: Not on file   Number of children: 1   Years of education: Not on file   Highest education level: Not on file  Occupational History   Occupation: Full time student  Tobacco Use   Smoking status: Never   Smokeless tobacco: Former    Quit date: 2013  Vaping Use   Vaping Use: Former  Substance and Sexual Activity   Alcohol use: No    Alcohol/week: 0.0 standard drinks of alcohol   Drug use: Not Currently    Types: Other-see comments, Oxycodone, Heroin    Comment: Former Use - 12/04/15   Sexual activity: Not Currently    Partners: Female  Other Topics Concern   Not on file  Social History Narrative   Lives with wife and  children in a one story home.  Has 2 children.     Works in Engineering geologist.     Education: college.   Social Determinants of Health   Financial Resource Strain: Not on file  Food Insecurity: Not on file  Transportation Needs: Not on file  Physical Activity: Not on file  Stress: Not on file  Social Connections: Not on file  Intimate Partner Violence: Not on file    Review of Systems  All other systems reviewed and are negative.       Objective    BP 122/80   Pulse 67   Temp 98.9 F (37.2 C) (Oral)   Ht 6'  4" (1.93 m)   Wt 257 lb (116.6 kg)   SpO2 98%   BMI 31.28 kg/m     10/13/2022    3:05 PM 10/13/2022    3:01 PM 06/19/2022    4:02 PM  Vitals with BMI  Height  6\' 4"    Weight  257 lbs   BMI  31.3   Systolic 122 140 811  Diastolic 80 82 66  Pulse  67 74      Physical Exam Vitals and nursing note reviewed.  Constitutional:      Appearance: Normal appearance. He is normal weight.  HENT:     Head: Normocephalic and atraumatic.  Cardiovascular:     Rate and Rhythm: Normal rate and regular rhythm.     Pulses: Normal pulses.     Heart sounds: Normal heart sounds.  Pulmonary:     Effort: Pulmonary effort is normal.     Breath sounds: Normal breath sounds.  Skin:    General: Skin is warm and dry.     Capillary Refill: Capillary refill takes less than 2 seconds.  Neurological:     General: No focal deficit present.     Mental Status: He is alert and oriented to person, place, and time. Mental status is at baseline.  Psychiatric:        Mood and Affect: Mood normal.        Behavior: Behavior normal.        Thought Content: Thought content normal.        Judgment: Judgment normal.     Last hemoglobin A1c Lab Results  Component Value Date   HGBA1C 8.3 (H) 06/13/2022        Assessment & Plan:   Problem List Items Addressed This Visit     Carpal tunnel syndrome on both sides   Relevant Medications   DULoxetine (CYMBALTA) 30 MG capsule   Generalized anxiety  disorder - Primary   Relevant Medications   DULoxetine (CYMBALTA) 30 MG capsule   Major depression, chronic   Relevant Medications   DULoxetine (CYMBALTA) 30 MG capsule   Type 2 diabetes mellitus (HCC)   Relevant Medications   Semaglutide,0.25 or 0.5MG /DOS, (OZEMPIC, 0.25 OR 0.5 MG/DOSE,) 2 MG/3ML SOPN   Other Relevant Orders   Hemoglobin A1c   Hypertension associated with diabetes (HCC)   Relevant Medications   Semaglutide,0.25 or 0.5MG /DOS, (OZEMPIC, 0.25 OR 0.5 MG/DOSE,) 2 MG/3ML SOPN   Other Relevant Orders   CBC with Differential/Platelet   COMPLETE METABOLIC PANEL WITH GFR   Lipid panel   Other Visit Diagnoses     Colon cancer screening       Relevant Orders   Cologuard       Return for annual physical with fasting labs.   Park Meo, FNP

## 2022-10-13 NOTE — Assessment & Plan Note (Signed)
Uncontrolled. Last A1c 8.4 in January. Taking Metformin, Ozempic 0.25mg  weekly, and [place]. Will recheck A1c today and increase Ozempic to 0.5mg  weekly. Follow up in 1-2 weeks for physical and will complete foot exam. uACR UTD. Needs PNA vaccine.

## 2022-10-13 NOTE — Assessment & Plan Note (Addendum)
Currently taking Amlodipine 2.5mg  daily. He would like to trial off of this. Instructed to monitor BP closely at home in AM and keep a log. Report to office if sustains >130/80. Seek medical care for chest pain, palpitations, shortness of breath, vision changes, or recurrent headaches.

## 2022-10-13 NOTE — Patient Instructions (Addendum)
It was great to meet you today and I'm excited to have you join the [facility] practice. I hope you had a positive experience today! If you feel so inclined, please feel free to recommend our practice to friends and family. Kurtis Bushman, FNP-C  Come back for fasting labs and annual physical one week later You can trial off the Amlodipine, monitor BP at home in AM and notify me if it is sustaining >140/90 Check blood glucose in AM fasting Add Cymbalta 30mg  for a total of 90mg  daily. Increase Ozempic to 0.5mg  weekly

## 2022-10-14 ENCOUNTER — Other Ambulatory Visit: Payer: Self-pay

## 2022-10-17 ENCOUNTER — Other Ambulatory Visit: Payer: Self-pay

## 2022-10-17 ENCOUNTER — Encounter: Payer: Self-pay | Admitting: Family Medicine

## 2022-10-17 DIAGNOSIS — Z23 Encounter for immunization: Secondary | ICD-10-CM

## 2022-10-20 ENCOUNTER — Other Ambulatory Visit: Payer: Self-pay

## 2022-10-21 ENCOUNTER — Telehealth: Payer: Self-pay

## 2022-10-21 NOTE — Telephone Encounter (Signed)
PA -Ozempic sent to plan 

## 2022-10-24 NOTE — Telephone Encounter (Signed)
10/21/22 PA-Ozempic send to plan  Denied  10/24/22 appeal send to plan

## 2022-11-03 ENCOUNTER — Encounter: Payer: BLUE CROSS/BLUE SHIELD | Admitting: Family Medicine

## 2022-11-09 ENCOUNTER — Encounter: Payer: Self-pay | Admitting: Family Medicine

## 2022-11-09 ENCOUNTER — Ambulatory Visit (INDEPENDENT_AMBULATORY_CARE_PROVIDER_SITE_OTHER): Payer: BLUE CROSS/BLUE SHIELD | Admitting: Family Medicine

## 2022-11-09 ENCOUNTER — Other Ambulatory Visit: Payer: Self-pay

## 2022-11-09 VITALS — BP 136/82 | HR 91 | Temp 98.9°F | Ht 76.0 in | Wt 271.4 lb

## 2022-11-09 DIAGNOSIS — F329 Major depressive disorder, single episode, unspecified: Secondary | ICD-10-CM

## 2022-11-09 DIAGNOSIS — E1159 Type 2 diabetes mellitus with other circulatory complications: Secondary | ICD-10-CM

## 2022-11-09 DIAGNOSIS — I152 Hypertension secondary to endocrine disorders: Secondary | ICD-10-CM | POA: Diagnosis not present

## 2022-11-09 DIAGNOSIS — E1169 Type 2 diabetes mellitus with other specified complication: Secondary | ICD-10-CM

## 2022-11-09 MED ORDER — FREESTYLE LIBRE 3 READER DEVI
1.0000 [IU] | Freq: Every day | 1 refills | Status: DC
  Filled 2022-11-09 – 2022-12-28 (×3): qty 1, 30d supply, fill #0

## 2022-11-09 MED ORDER — ARIPIPRAZOLE 2 MG PO TABS
2.0000 mg | ORAL_TABLET | Freq: Every day | ORAL | 0 refills | Status: DC
  Filled 2022-11-09: qty 30, 30d supply, fill #0

## 2022-11-09 MED ORDER — SEMAGLUTIDE (1 MG/DOSE) 4 MG/3ML ~~LOC~~ SOPN
1.0000 mg | PEN_INJECTOR | SUBCUTANEOUS | 0 refills | Status: DC
  Filled 2022-11-09 – 2023-05-05 (×8): qty 3, 28d supply, fill #0

## 2022-11-09 NOTE — Progress Notes (Signed)
Subjective:  HPI: Timothy [name] is a 52 y.o. male presenting on 11/09/2022 for Follow-up (Here for follow up on diabetes)   HPI Patient is in today for blood pressure follow up. He is monitoring daily 4 days weekly at home and his BP is <130/80. Denies chest pain, palpitations, lightheadedness, dizziness, vision changes, or recurrent headaches. Remains off Amlodipine.  He reports unchanged depression and anhedonia. Would like to try another medication for depression. We did perform Genesight testing. Has tried Celexa, Zoloft, Trazodone, and Zoloft.   His labs were not drawn prior to today. Will obtain fasting labs today and return for physical. Home FBG 100-120. Denies polyuria, polydipsia, vision changes.     10/13/2022    4:17 PM 04/12/2022    3:54 PM 12/21/2021    2:31 PM 10/19/2021    2:27 PM  GAD 7 : Generalized Anxiety Score  Nervous, Anxious, on Edge 2 0 2 3  Control/stop worrying 2 1 2 3   Worry too much - different things 2 1 2 3   Trouble relaxing 1 1 2  0  Restless 1 0 2 1  Easily annoyed or irritable 3 3 3 3   Afraid - awful might happen 2 1 0 3  Total GAD 7 Score 13 7 13 16   Anxiety Difficulty Somewhat difficult         Review of Systems  All other systems reviewed and are negative.   Relevant past medical history reviewed and updated as indicated.   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   Current Outpatient Medications:     ARIPiprazole (ABILIFY) 2 MG tablet, Take 1 tablet (2 mg total) by mouth daily., Disp: 30 tablet, Rfl: 0   atorvastatin (LIPITOR) 20 MG tablet, Take 1 tablet (20 mg total) by mouth daily., Disp: 90 tablet, Rfl: 0   Continuous Glucose Receiver (FREESTYLE LIBRE 3 READER) DEVI, 1 Units by Does not apply route daily. Use to monitor blood sugars continuously every day., Disp: 1 each, Rfl: 1   DULoxetine (CYMBALTA) 60 MG capsule, Take 1 capsule (60 mg total) by mouth daily., Disp: 90 capsule, Rfl: 0   EPINEPHrine (EPIPEN 2-PAK) 0.3 mg/0.3 mL IJ SOAJ injection, Inject 0.3 mLs (0.3 mg total) into the muscle once as needed for up to 1 dose (for severe allergic reaction). CAll 911 immediately if you have to use this medicine, Disp: 2 each, Rfl: 0   gabapentin (NEURONTIN) 300 MG capsule, Take 2 capsules (600 mg total) by mouth 3 (three) times daily., Disp: 180 capsule, Rfl: 6   glucose blood (TRUE METRIX BLOOD GLUCOSE TEST) test strip, Use 3 times daily, Disp: 100 each, Rfl: 12   metFORMIN (GLUCOPHAGE) 1000 MG tablet, Take 1 tablet (1,000 mg total) by mouth 2 (two) times daily with a meal., Disp: 180 tablet, Rfl: 0   methadone (DOLOPHINE) 10 MG/ML solution, Take 70 mg by mouth daily., Disp: , Rfl:  omeprazole (PRILOSEC) 40 MG capsule, Take 1 capsule (40 mg total) by mouth daily., Disp: 90 capsule, Rfl: 0   rivaroxaban (XARELTO) 20 MG TABS tablet, Take 1 tablet (20 mg total) by mouth daily with supper., Disp: 30 tablet, Rfl: 1   Semaglutide, 1 MG/DOSE, 4 MG/3ML SOPN, Inject 1 mg as directed once a week., Disp: 3 mL, Rfl: 0   TRUEplus Lancets 28G MISC, use to test blood sugar 3 (three) times daily before meals., Disp: 100 each, Rfl: 12   amLODipine (NORVASC) 2.5 MG tablet, Take 1 tablet (2.5 mg total) by mouth daily. (Patient not taking: Reported on 11/09/2022), Disp: 90 tablet, Rfl: 1   dapagliflozin propanediol (FARXIGA) 10 MG TABS tablet, Take 1 tablet (10 mg total) by mouth daily before breakfast. (Patient not  taking: Reported on 11/09/2022), Disp: 30 tablet, Rfl: 6  Allergies  Allergen Reactions   Beef-Derived Products Anaphylaxis    Alpha Gal.    Food Anaphylaxis    All mammalian meat - anaphylaxis (alpha gal)   Ioxaglate Shortness Of Breath and Other (See Comments)    Dizziness    Ivp Dye [Iodinated Contrast Media] Shortness Of Breath and Other (See Comments)    Dizziness    Pork-Derived Products Anaphylaxis    Alpha Gal.   Shellfish Allergy Swelling    Swelling of face/mouth. No airway restriction.    Objective:   BP 136/82   Pulse 91   Temp 98.9 F (37.2 C)   Ht 6\' 4"  (1.93 m)   Wt 271 lb 6.4 oz (123.1 kg)   SpO2 98%   BMI 33.04 kg/m      11/09/2022    4:34 PM 11/09/2022    3:41 PM 10/13/2022    3:05 PM  Vitals with BMI  Height  6\' 4"    Weight  271 lbs 6 oz   BMI  33.05   Systolic 136 152 161  Diastolic 82 86 80  Pulse  91      Physical Exam Vitals and nursing note reviewed.  Constitutional:      Appearance: Normal appearance. He is normal weight.  HENT:     Head: Normocephalic and atraumatic.  Skin:    General: Skin is warm and dry.     Capillary Refill: Capillary refill takes less than 2 seconds.  Neurological:     General: No focal deficit present.     Mental Status: He is alert and oriented to person, place, and time. Mental status is at baseline.  Psychiatric:        Mood and Affect: Mood normal.        Behavior: Behavior normal.        Thought Content: Thought content normal.        Judgment: Judgment normal.     Assessment & Plan:  Type 2 diabetes mellitus with other specified complication, without long-term current use of insulin (HCC) Assessment & Plan: Uncontrolled. Last A1c 8.4 in January. Taking Metformin, Ozempic 0.25mg  weekly, and [place]. Will recheck A1c today and increase Ozempic to 1mg  weekly. Follow up in 1-2 weeks for physical and will complete foot exam. uACR UTD. Needs PNA vaccine.   Orders: -     FreeStyle Libre 3 Reader; 1 Units by  Does not apply route daily. Use to monitor blood sugars continuously every day.  Dispense: 1 each; Refill: 1  Major depression, chronic Assessment & Plan: Uncontrolled. Will cross taper Cymbalta and Abilify. Decrease Cymbalta back down to 60mg  daily. Start Abilify 2mg   daily. Return to office in 4 weeks.    Hypertension associated with diabetes (HCC) Assessment & Plan: 136/82 today. Home readings remain <130/80. Would like to stay off amlodipine. Instructed to monitor BP closely at home in AM and keep a log. Report to office if sustains >130/80. Seek medical care for chest pain, palpitations, shortness of breath, vision changes, or recurrent headaches.   Other orders -     ARIPiprazole; Take 1 tablet (2 mg total) by mouth daily.  Dispense: 30 tablet; Refill: 0 -     Semaglutide (1 MG/DOSE); Inject 1 mg as directed once a week.  Dispense: 3 mL; Refill: 0     Follow up plan: Return in about 4 weeks (around 12/07/2022) for anxiety/depression, diabetes, hypertension.  Park Meo, FNP

## 2022-11-09 NOTE — Assessment & Plan Note (Signed)
Uncontrolled. Will cross taper Cymbalta and Abilify. Decrease Cymbalta back down to 60mg  daily. Start Abilify 2mg  daily. Return to office in 4 weeks.

## 2022-11-09 NOTE — Assessment & Plan Note (Addendum)
136/82 today. Home readings remain <130/80. Would like to stay off amlodipine. Instructed to monitor BP closely at home in AM and keep a log. Report to office if sustains >130/80. Seek medical care for chest pain, palpitations, shortness of breath, vision changes, or recurrent headaches.

## 2022-11-09 NOTE — Assessment & Plan Note (Signed)
Uncontrolled. Last A1c 8.4 in January. Taking Metformin, Ozempic 0.25mg  weekly, and [place]. Will recheck A1c today and increase Ozempic to 1mg  weekly. Follow up in 1-2 weeks for physical and will complete foot exam. uACR UTD. Needs PNA vaccine.

## 2022-11-10 ENCOUNTER — Other Ambulatory Visit: Payer: Self-pay

## 2022-11-10 ENCOUNTER — Other Ambulatory Visit: Payer: Self-pay | Admitting: Family Medicine

## 2022-11-10 DIAGNOSIS — E1169 Type 2 diabetes mellitus with other specified complication: Secondary | ICD-10-CM

## 2022-11-10 DIAGNOSIS — E1159 Type 2 diabetes mellitus with other circulatory complications: Secondary | ICD-10-CM

## 2022-11-10 LAB — COMPLETE METABOLIC PANEL WITH GFR
AG Ratio: 1.4 (calc) (ref 1.0–2.5)
ALT: 13 U/L (ref 9–46)
AST: 13 U/L (ref 10–35)
Albumin: 4.1 g/dL (ref 3.6–5.1)
Alkaline phosphatase (APISO): 90 U/L (ref 35–144)
BUN: 18 mg/dL (ref 7–25)
CO2: 25 mmol/L (ref 20–32)
Calcium: 9.1 mg/dL (ref 8.6–10.3)
Chloride: 104 mmol/L (ref 98–110)
Creat: 0.74 mg/dL (ref 0.70–1.30)
Globulin: 3 g/dL (calc) (ref 1.9–3.7)
Glucose, Bld: 153 mg/dL — ABNORMAL HIGH (ref 65–99)
Potassium: 4 mmol/L (ref 3.5–5.3)
Sodium: 139 mmol/L (ref 135–146)
Total Bilirubin: 0.2 mg/dL (ref 0.2–1.2)
Total Protein: 7.1 g/dL (ref 6.1–8.1)
eGFR: 110 mL/min/{1.73_m2} (ref >=60)

## 2022-11-10 LAB — CBC WITH DIFFERENTIAL/PLATELET
Absolute Monocytes: 432 cells/uL (ref 200–950)
Basophils Absolute: 42 cells/uL (ref 0–200)
Basophils Relative: 0.7 %
Eosinophils Absolute: 150 cells/uL (ref 15–500)
Eosinophils Relative: 2.5 %
HCT: 38.7 % (ref 38.5–50.0)
Hemoglobin: 12.7 g/dL — ABNORMAL LOW (ref 13.2–17.1)
Lymphs Abs: 1944 cells/uL (ref 850–3900)
MCH: 28.9 pg (ref 27.0–33.0)
MCHC: 32.8 g/dL (ref 32.0–36.0)
MCV: 88.2 fL (ref 80.0–100.0)
MPV: 10.9 fL (ref 7.5–12.5)
Monocytes Relative: 7.2 %
Neutro Abs: 3432 cells/uL (ref 1500–7800)
Neutrophils Relative %: 57.2 %
Platelets: 201 10*3/uL (ref 140–400)
RBC: 4.39 10*6/uL (ref 4.20–5.80)
RDW: 15.1 % — ABNORMAL HIGH (ref 11.0–15.0)
Total Lymphocyte: 32.4 %
WBC: 6 10*3/uL (ref 3.8–10.8)

## 2022-11-10 LAB — HEMOGLOBIN A1C
Hgb A1c MFr Bld: 6.8 % of total Hgb — ABNORMAL HIGH (ref <5.7)
Mean Plasma Glucose: 148 mg/dL
eAG (mmol/L): 8.2 mmol/L

## 2022-11-10 LAB — LIPID PANEL
Cholesterol: 181 mg/dL (ref <200)
HDL: 38 mg/dL — ABNORMAL LOW (ref >=40)
LDL Cholesterol (Calc): 109 mg/dL (calc) — ABNORMAL HIGH
Non-HDL Cholesterol (Calc): 143 mg/dL (calc) — ABNORMAL HIGH (ref <130)
Total CHOL/HDL Ratio: 4.8 (calc) (ref <5.0)
Triglycerides: 222 mg/dL — ABNORMAL HIGH (ref <150)

## 2022-11-10 MED ORDER — ATORVASTATIN CALCIUM 40 MG PO TABS
40.0000 mg | ORAL_TABLET | Freq: Every day | ORAL | 0 refills | Status: DC
  Filled 2022-11-10: qty 90, 90d supply, fill #0

## 2022-11-10 MED ORDER — FREESTYLE LIBRE 3 SENSOR MISC
3 refills | Status: DC
  Filled 2022-11-10 – 2022-12-28 (×3): qty 2, 28d supply, fill #0

## 2022-11-11 ENCOUNTER — Other Ambulatory Visit: Payer: Self-pay

## 2022-11-14 ENCOUNTER — Other Ambulatory Visit: Payer: Self-pay

## 2022-11-15 ENCOUNTER — Other Ambulatory Visit: Payer: Self-pay

## 2022-11-17 ENCOUNTER — Other Ambulatory Visit: Payer: Self-pay

## 2022-11-17 ENCOUNTER — Telehealth: Payer: Self-pay | Admitting: Family Medicine

## 2022-11-17 NOTE — Telephone Encounter (Signed)
Left message to return call to schedule diabetic retinal eye exam appointment.

## 2022-11-18 ENCOUNTER — Other Ambulatory Visit: Payer: Self-pay

## 2022-11-21 ENCOUNTER — Other Ambulatory Visit: Payer: Self-pay

## 2022-11-23 ENCOUNTER — Other Ambulatory Visit: Payer: Self-pay

## 2022-11-24 ENCOUNTER — Telehealth: Payer: Self-pay

## 2022-11-24 NOTE — Telephone Encounter (Signed)
PA -Ozempic sent to plan 

## 2022-11-29 ENCOUNTER — Other Ambulatory Visit: Payer: Self-pay

## 2022-11-30 ENCOUNTER — Other Ambulatory Visit: Payer: Self-pay

## 2022-12-01 ENCOUNTER — Other Ambulatory Visit: Payer: Self-pay

## 2022-12-03 ENCOUNTER — Other Ambulatory Visit: Payer: Self-pay | Admitting: Family Medicine

## 2022-12-03 DIAGNOSIS — E1159 Type 2 diabetes mellitus with other circulatory complications: Secondary | ICD-10-CM

## 2022-12-03 DIAGNOSIS — E1169 Type 2 diabetes mellitus with other specified complication: Secondary | ICD-10-CM

## 2022-12-03 DIAGNOSIS — I2699 Other pulmonary embolism without acute cor pulmonale: Secondary | ICD-10-CM

## 2022-12-05 ENCOUNTER — Other Ambulatory Visit: Payer: Self-pay

## 2022-12-06 ENCOUNTER — Other Ambulatory Visit: Payer: Self-pay

## 2022-12-06 MED ORDER — METFORMIN HCL 1000 MG PO TABS
1000.0000 mg | ORAL_TABLET | Freq: Two times a day (BID) | ORAL | 0 refills | Status: DC
  Filled 2022-12-06: qty 180, 90d supply, fill #0

## 2022-12-06 MED ORDER — RIVAROXABAN 20 MG PO TABS
20.0000 mg | ORAL_TABLET | Freq: Every day | ORAL | 1 refills | Status: DC
  Filled 2022-12-06 – 2023-05-05 (×9): qty 30, 30d supply, fill #0
  Filled 2023-06-09 – 2023-07-23 (×2): qty 30, 30d supply, fill #1

## 2022-12-06 MED ORDER — OMEPRAZOLE 40 MG PO CPDR
40.0000 mg | DELAYED_RELEASE_CAPSULE | Freq: Every day | ORAL | 0 refills | Status: DC
  Filled 2022-12-06: qty 30, 30d supply, fill #0
  Filled 2023-01-10: qty 30, 30d supply, fill #1
  Filled 2023-02-15: qty 30, 30d supply, fill #2

## 2022-12-06 MED ORDER — AMLODIPINE BESYLATE 2.5 MG PO TABS
2.5000 mg | ORAL_TABLET | Freq: Every day | ORAL | 0 refills | Status: DC
  Filled 2022-12-06: qty 90, 90d supply, fill #0

## 2022-12-06 NOTE — Telephone Encounter (Signed)
Requested Prescriptions  Pending Prescriptions Disp Refills   amLODipine (NORVASC) 2.5 MG tablet 90 tablet 0    Sig: Take 1 tablet (2.5 mg total) by mouth daily.     Cardiovascular: Calcium Channel Blockers 2 Failed - 12/03/2022 10:38 PM      Failed - Valid encounter within last 6 months    Recent Outpatient Visits           7 months ago Type 2 diabetes mellitus with other specified complication, without long-term current use of insulin (HCC)   [place] [name] [place], Odette Horns, MD   11 months ago Type 2 diabetes mellitus with other specified complication, without long-term current use of insulin (HCC)   [place] [facility] [place], Odette Horns, MD   1 year ago Type 2 diabetes mellitus with other specified complication, without long-term current use of insulin (HCC)   [facility] [place], Odette Horns, MD   11 years ago Chest pain   Primary Care at [facility], Tessa Lerner, MD       Future Appointments             Tomorrow Park Meo, FNP [place] [facility]            Passed - Last BP in normal range    BP Readings from Last 1 Encounters:  11/09/22 136/82         Passed - Last Heart Rate in normal range    Pulse Readings from Last 1 Encounters:  11/09/22 91          metFORMIN (GLUCOPHAGE) 1000 MG tablet 180 tablet 0    Sig: Take 1 tablet (1,000 mg total) by mouth 2 (two) times daily with a meal.     Endocrinology:  Diabetes - Biguanides Failed - 12/03/2022 10:38 PM      Failed - B12 Level in normal range and within 720 days    Vitamin B-12  Date Value Ref Range Status  09/23/2015 412 211 - 911 pg/mL Final         Failed - Valid encounter within last 6 months    Recent Outpatient Visits           7 months ago Type 2 diabetes mellitus with other specified complication, without long-term current use of insulin (HCC)   [place] [facility] [place], [place], MD   11 months ago Type 2 diabetes mellitus with other specified complication, without long-term current use of insulin (HCC)   [place] [facility] [place], [place], MD   1 year ago Type 2 diabetes mellitus with other specified complication, without long-term current use of insulin (HCC)   [place] [facility] Hoy Register, MD   11 years ago Chest pain   Primary [facility] at [facility], Tessa Lerner, MD       Future Appointments             Tomorrow Park Meo, FNP  [facility]            Passed - Cr in normal range and within 360 days    Creat  Date Value Ref Range Status  11/09/2022 0.74 0.70 - 1.30 mg/dL Final         Passed - HBA1C is between 0 and 7.9 and within 180 days    HbA1c, POC (controlled  diabetic range)  Date Value Ref Range Status  04/12/2022 6.8 0.0 - 7.0 % Final   Hgb A1c MFr Bld  Date Value Ref Range Status  11/09/2022 6.8 (H) <5.7 % of total Hgb Final    Comment:    For someone without known diabetes, a hemoglobin A1c value of 6.5% or greater indicates that they may have  diabetes and this should be confirmed with a follow-up  test. . For someone with known diabetes, a value <7% indicates  that their diabetes is well controlled and a value  greater than or equal to 7% indicates suboptimal  control. A1c targets should be individualized based on  duration of diabetes, age, comorbid conditions, and  other considerations. . Currently, no consensus exists regarding use of hemoglobin A1c for diagnosis of diabetes for children. .          Passed - eGFR in normal range and within 360 days    GFR calc Af Amer  Date Value Ref Range Status  12/03/2018 >60 >60 mL/min Final   GFR, Estimated  Date Value Ref Range Status  06/19/2022 >60 >60 mL/min Final    Comment:    (NOTE) Calculated using the CKD-EPI Creatinine Equation  (2021)    eGFR  Date Value Ref Range Status  11/09/2022 110 > OR = 60 mL/min/1.48m2 Final  10/19/2021 107 >59 mL/min/1.73 Final         Passed - CBC within normal limits and completed in the last 12 months    WBC  Date Value Ref Range Status  11/09/2022 6.0 3.8 - 10.8 Thousand/uL Final   RBC  Date Value Ref Range Status  11/09/2022 4.39 4.20 - 5.80 Million/uL Final   Hemoglobin  Date Value Ref Range Status  11/09/2022 12.7 (L) 13.2 - 17.1 g/dL Final  16/03/9603 54.0 12.6 - 17.7 g/dL Final   HCT  Date Value Ref Range Status  11/09/2022 38.7 38.5 - 50.0 % Final   Hematocrit  Date Value Ref Range Status  06/04/2015 42.5 37.5 - 51.0 % Final   MCHC  Date Value Ref Range Status  11/09/2022 32.8 32.0 - 36.0 g/dL Final   [facility]  Date Value Ref Range Status  11/09/2022 28.9 27.0 - 33.0 pg Final   MCV  Date Value Ref Range Status  11/09/2022 88.2 80.0 - 100.0 fL Final  06/04/2015 92 79 - 97 fL Final   No results found for: "PLTCOUNTKUC", "LABPLAT", "POCPLA" RDW  Date Value Ref Range Status  11/09/2022 15.1 (H) 11.0 - 15.0 % Final  06/04/2015 12.8 12.3 - 15.4 % Final          omeprazole (PRILOSEC) 40 MG capsule 90 capsule 0    Sig: Take 1 capsule (40 mg total) by mouth daily.     Gastroenterology: Proton Pump Inhibitors Passed - 12/03/2022 10:38 PM      Passed - Valid encounter within last 12 months    Recent Outpatient Visits           7 months ago Type 2 diabetes mellitus with other specified complication, without long-term current use of insulin (HCC)   [place] [facility] [place], [place], MD   11 months ago Type 2 diabetes mellitus with other specified complication, without long-term current use of insulin [facility])   [place] [facility] [place], Odette Horns, MD   1 year ago Type 2 diabetes mellitus with other specified complication, without long-term current use of insulin (HCC)  [place] [facility] Hoy Register, MD   11 years ago Chest pain   [facility] at [facility], Tessa Lerner, MD       Future Appointments             Tomorrow Park Meo, FNP [place] [facility]             rivaroxaban (XARELTO) 20 MG TABS tablet 30 tablet 1    Sig: Take 1 tablet (20 mg total) by mouth daily with supper.     Hematology: Anticoagulants - rivaroxaban Failed - 12/03/2022 10:38 PM      Failed - HGB in normal range and within 360 days    Hemoglobin  Date Value Ref Range Status  11/09/2022 12.7 (L) 13.2 - 17.1 g/dL Final  40/98/1191 47.8 12.6 - 17.7 g/dL Final         Passed - ALT in normal range and within 360 days    ALT  Date Value Ref Range Status  11/09/2022 13 9 - 46 U/L Final         Passed - AST in normal range and within 360 days    AST  Date Value Ref Range Status  11/09/2022 13 10 - 35 U/L Final         Passed - Cr in normal range and within 360 days    Creat  Date Value Ref Range Status  11/09/2022 0.74 0.70 - 1.30 mg/dL Final         Passed - HCT in normal range and within 360 days    HCT  Date Value Ref Range Status  11/09/2022 38.7 38.5 - 50.0 % Final   Hematocrit  Date Value Ref Range Status  06/04/2015 42.5 37.5 - 51.0 % Final         Passed - PLT in normal range and within 360 days    Platelets  Date Value Ref Range Status  11/09/2022 201 140 - 400 Thousand/uL Final  06/04/2015 176 150 - 379 x10E3/uL Final         Passed - eGFR is 15 or above and within 360 days    GFR calc Af Amer  Date Value Ref Range Status  12/03/2018 >60 >60 mL/min Final   GFR, Estimated  Date Value Ref Range Status  06/19/2022 >60 >60 mL/min Final    Comment:    (NOTE) Calculated using the CKD-EPI Creatinine Equation (2021)    eGFR  Date Value Ref Range Status  11/09/2022 110 > OR = 60 mL/min/1.16m2 Final  10/19/2021 107 >59 mL/min/1.73 Final         Passed - Patient is not pregnant      Passed - Valid  encounter within last 12 months    Recent Outpatient Visits           7 months ago Type 2 diabetes mellitus with other specified complication, without long-term current use of insulin (HCC)   [place] [facility] [place], [place], MD   11 months ago Type 2 diabetes mellitus with other specified complication, without long-term current use of insulin (HCC)   [place] [facility] [place], Odette Horns, MD   1 year ago Type 2 diabetes mellitus with other specified complication, without long-term current use of insulin [facility])   [place] [facility] Hoy Register, MD   11 years ago Chest pain   Primary Care at  Mil[facility] [place], MD       Future Appointments             Tomorrow Park Meo, FNP [facility]

## 2022-12-07 ENCOUNTER — Ambulatory Visit: Payer: BLUE CROSS/BLUE SHIELD | Admitting: Family Medicine

## 2022-12-11 ENCOUNTER — Other Ambulatory Visit: Payer: Self-pay | Admitting: Family Medicine

## 2022-12-12 ENCOUNTER — Other Ambulatory Visit: Payer: Self-pay

## 2022-12-12 MED ORDER — ARIPIPRAZOLE 2 MG PO TABS
2.0000 mg | ORAL_TABLET | Freq: Every day | ORAL | 0 refills | Status: DC
  Filled 2022-12-12: qty 30, 30d supply, fill #0

## 2022-12-13 ENCOUNTER — Other Ambulatory Visit: Payer: Self-pay

## 2022-12-14 ENCOUNTER — Other Ambulatory Visit: Payer: Self-pay

## 2022-12-21 ENCOUNTER — Other Ambulatory Visit (HOSPITAL_COMMUNITY): Payer: Self-pay

## 2022-12-21 ENCOUNTER — Other Ambulatory Visit: Payer: Self-pay

## 2022-12-26 ENCOUNTER — Other Ambulatory Visit: Payer: Self-pay

## 2022-12-28 ENCOUNTER — Other Ambulatory Visit: Payer: Self-pay | Admitting: Family Medicine

## 2022-12-28 DIAGNOSIS — F329 Major depressive disorder, single episode, unspecified: Secondary | ICD-10-CM

## 2022-12-28 DIAGNOSIS — G5603 Carpal tunnel syndrome, bilateral upper limbs: Secondary | ICD-10-CM

## 2022-12-29 ENCOUNTER — Other Ambulatory Visit: Payer: Self-pay

## 2022-12-29 ENCOUNTER — Ambulatory Visit: Payer: BLUE CROSS/BLUE SHIELD | Admitting: Family Medicine

## 2022-12-29 NOTE — Telephone Encounter (Signed)
Requested medication (s) are due for refill today: yes   Requested medication (s) are on the active medication list: yes   Last refill:  10/03/22 #90 0 refills  Future visit scheduled: yes with another new provider at another location in 2 weeks   Notes to clinic:   do you want to give courtesy refill?      Requested Prescriptions  Pending Prescriptions Disp Refills   DULoxetine (CYMBALTA) 60 MG capsule 90 capsule 0    Sig: Take 1 capsule (60 mg total) by mouth daily.     Psychiatry: Antidepressants - SNRI - duloxetine Failed - 12/28/2022  9:34 PM      Failed - Valid encounter within last 6 months    Recent Outpatient Visits           8 months ago Type 2 diabetes mellitus with other specified complication, without long-term current use of insulin (HCC)   [place] [facility] [place], Odette Horns, MD   1 year ago Type 2 diabetes mellitus with other specified complication, without long-term current use of insulin (HCC)   [place] [facility] [place], Odette Horns, MD   1 year ago Type 2 diabetes mellitus with other specified complication, without long-term current use of insulin (HCC)   [place] [facility] [place], Odette Horns, MD   11 years ago Chest pain   [facility] at [facility], Tessa Lerner, MD       Future Appointments             In 2 weeks Park Meo, FNP [place] [facility]            Passed - Cr in normal range and within 360 days    Creat  Date Value Ref Range Status  11/09/2022 0.74 0.70 - 1.30 mg/dL Final         Passed - eGFR is 30 or above and within 360 days    GFR calc Af Amer  Date Value Ref Range Status  12/03/2018 >60 >60 mL/min Final   GFR, Estimated  Date Value Ref Range Status  06/19/2022 >60 >60 mL/min Final    Comment:    (NOTE) Calculated using the CKD-EPI Creatinine Equation (2021)    eGFR  Date Value Ref Range Status  11/09/2022 110  > OR = 60 mL/min/1.65m2 Final  10/19/2021 107 >59 mL/min/1.73 Final         Passed - Completed PHQ-2 or PHQ-9 in the last 360 days      Passed - Last BP in normal range    BP Readings from Last 1 Encounters:  11/09/22 136/82

## 2022-12-30 ENCOUNTER — Other Ambulatory Visit: Payer: Self-pay

## 2022-12-30 ENCOUNTER — Other Ambulatory Visit: Payer: Self-pay | Admitting: Family Medicine

## 2022-12-30 ENCOUNTER — Telehealth: Payer: Self-pay

## 2022-12-30 DIAGNOSIS — F329 Major depressive disorder, single episode, unspecified: Secondary | ICD-10-CM

## 2022-12-30 DIAGNOSIS — G5603 Carpal tunnel syndrome, bilateral upper limbs: Secondary | ICD-10-CM

## 2022-12-30 NOTE — Telephone Encounter (Signed)
PA-Ozempic has been sent to plan. Waiting for insurance to approve

## 2023-01-02 ENCOUNTER — Other Ambulatory Visit: Payer: Self-pay

## 2023-01-02 MED ORDER — DULOXETINE HCL 60 MG PO CPEP
60.0000 mg | ORAL_CAPSULE | Freq: Every day | ORAL | 0 refills | Status: DC
  Filled 2023-01-02 – 2023-01-10 (×2): qty 90, 90d supply, fill #0

## 2023-01-04 ENCOUNTER — Other Ambulatory Visit: Payer: Self-pay

## 2023-01-04 NOTE — Telephone Encounter (Signed)
PA-Ozempic has been APPROVED  Message from Plan Approved. . Authorization Expiration Date: January 03, 2024.  SENT MSG TO PT VIA PT[facility]

## 2023-01-06 ENCOUNTER — Other Ambulatory Visit: Payer: Self-pay

## 2023-01-10 ENCOUNTER — Other Ambulatory Visit: Payer: Self-pay

## 2023-01-10 ENCOUNTER — Other Ambulatory Visit: Payer: Self-pay | Admitting: Family Medicine

## 2023-01-10 DIAGNOSIS — E1169 Type 2 diabetes mellitus with other specified complication: Secondary | ICD-10-CM

## 2023-01-11 ENCOUNTER — Other Ambulatory Visit: Payer: Self-pay

## 2023-01-11 ENCOUNTER — Other Ambulatory Visit: Payer: Self-pay | Admitting: Family Medicine

## 2023-01-11 DIAGNOSIS — E1169 Type 2 diabetes mellitus with other specified complication: Secondary | ICD-10-CM

## 2023-01-11 MED ORDER — TRUE METRIX BLOOD GLUCOSE TEST VI STRP
ORAL_STRIP | 12 refills | Status: DC
  Filled 2023-01-11: qty 100, 33d supply, fill #0
  Filled 2023-02-15: qty 100, 33d supply, fill #1
  Filled 2023-03-27: qty 100, 33d supply, fill #2
  Filled 2023-05-05: qty 100, 33d supply, fill #3
  Filled 2023-06-09 – 2023-11-25 (×3): qty 100, 33d supply, fill #4

## 2023-01-12 ENCOUNTER — Ambulatory Visit: Payer: BLUE CROSS/BLUE SHIELD | Admitting: Family Medicine

## 2023-01-12 ENCOUNTER — Other Ambulatory Visit: Payer: Self-pay

## 2023-01-12 MED ORDER — ARIPIPRAZOLE 2 MG PO TABS
2.0000 mg | ORAL_TABLET | Freq: Every day | ORAL | 0 refills | Status: DC
  Filled 2023-01-12: qty 30, 30d supply, fill #0

## 2023-01-16 ENCOUNTER — Other Ambulatory Visit: Payer: Self-pay

## 2023-01-18 ENCOUNTER — Ambulatory Visit: Payer: BLUE CROSS/BLUE SHIELD | Admitting: Family Medicine

## 2023-01-26 ENCOUNTER — Other Ambulatory Visit: Payer: Self-pay

## 2023-01-30 ENCOUNTER — Other Ambulatory Visit: Payer: Self-pay

## 2023-02-02 ENCOUNTER — Ambulatory Visit: Payer: BLUE CROSS/BLUE SHIELD | Admitting: Family Medicine

## 2023-02-07 ENCOUNTER — Other Ambulatory Visit: Payer: Self-pay

## 2023-02-15 ENCOUNTER — Ambulatory Visit: Payer: BLUE CROSS/BLUE SHIELD | Admitting: Family Medicine

## 2023-02-15 ENCOUNTER — Other Ambulatory Visit: Payer: Self-pay | Admitting: Family Medicine

## 2023-02-15 DIAGNOSIS — E1169 Type 2 diabetes mellitus with other specified complication: Secondary | ICD-10-CM

## 2023-02-16 ENCOUNTER — Other Ambulatory Visit: Payer: Self-pay

## 2023-02-16 MED ORDER — ARIPIPRAZOLE 2 MG PO TABS
2.0000 mg | ORAL_TABLET | Freq: Every day | ORAL | 0 refills | Status: DC
  Filled 2023-02-16 (×2): qty 30, 30d supply, fill #0

## 2023-02-16 MED ORDER — ATORVASTATIN CALCIUM 40 MG PO TABS
40.0000 mg | ORAL_TABLET | Freq: Every day | ORAL | 0 refills | Status: DC
  Filled 2023-02-16 (×2): qty 90, 90d supply, fill #0

## 2023-02-17 ENCOUNTER — Other Ambulatory Visit: Payer: Self-pay

## 2023-02-20 ENCOUNTER — Other Ambulatory Visit: Payer: Self-pay

## 2023-02-23 ENCOUNTER — Other Ambulatory Visit: Payer: Self-pay

## 2023-03-01 ENCOUNTER — Other Ambulatory Visit: Payer: Self-pay

## 2023-03-08 ENCOUNTER — Ambulatory Visit: Payer: BLUE CROSS/BLUE SHIELD | Admitting: Family Medicine

## 2023-03-27 ENCOUNTER — Other Ambulatory Visit: Payer: Self-pay | Admitting: Family Medicine

## 2023-03-27 ENCOUNTER — Other Ambulatory Visit: Payer: Self-pay

## 2023-03-27 DIAGNOSIS — E1142 Type 2 diabetes mellitus with diabetic polyneuropathy: Secondary | ICD-10-CM

## 2023-03-27 DIAGNOSIS — E1169 Type 2 diabetes mellitus with other specified complication: Secondary | ICD-10-CM

## 2023-03-27 DIAGNOSIS — E1159 Type 2 diabetes mellitus with other circulatory complications: Secondary | ICD-10-CM

## 2023-03-27 MED ORDER — OMEPRAZOLE 40 MG PO CPDR
40.0000 mg | DELAYED_RELEASE_CAPSULE | Freq: Every day | ORAL | 0 refills | Status: AC
  Filled 2023-03-27: qty 30, 30d supply, fill #0
  Filled 2023-05-05: qty 30, 30d supply, fill #1
  Filled 2023-06-09 – 2023-11-25 (×3): qty 30, 30d supply, fill #2

## 2023-03-27 MED ORDER — METFORMIN HCL 1000 MG PO TABS
1000.0000 mg | ORAL_TABLET | Freq: Two times a day (BID) | ORAL | 0 refills | Status: DC
  Filled 2023-03-27: qty 180, 90d supply, fill #0

## 2023-03-27 MED ORDER — AMLODIPINE BESYLATE 2.5 MG PO TABS
2.5000 mg | ORAL_TABLET | Freq: Every day | ORAL | 0 refills | Status: DC
  Filled 2023-03-27: qty 90, 90d supply, fill #0

## 2023-03-27 MED ORDER — ARIPIPRAZOLE 2 MG PO TABS
2.0000 mg | ORAL_TABLET | Freq: Every day | ORAL | 0 refills | Status: DC
  Filled 2023-03-27: qty 30, 30d supply, fill #0

## 2023-03-28 NOTE — Telephone Encounter (Signed)
Requested medications are due for refill today.  yes  Requested medications are on the active medications list.  yes  Last refill. 03/22/2022 #180 6 rf  Future visit scheduled.   yes  Notes to clinic.  Rx signed by Hoy Register.    Requested Prescriptions  Pending Prescriptions Disp Refills   gabapentin (NEURONTIN) 300 MG capsule 180 capsule 6    Sig: Take 2 capsules (600 mg total) by mouth 3 (three) times daily.     Neurology: Anticonvulsants - gabapentin Passed - 03/27/2023  2:51 PM      Passed - Cr in normal range and within 360 days    Creat  Date Value Ref Range Status  11/09/2022 0.74 0.70 - 1.30 mg/dL Final         Passed - Completed PHQ-2 or PHQ-9 in the last 360 days      Passed - Valid encounter within last 12 months    Recent Outpatient Visits           11 months ago Type 2 diabetes mellitus with other specified complication, without long-term current use of insulin (HCC)   [place] [facility] [place], [place], MD   1 year ago Type 2 diabetes mellitus with other specified complication, without long-term current use of insulin (HCC)   [place] [facility] [place], Odette Horns, MD   1 year ago Type 2 diabetes mellitus with other specified complication, without long-term current use of insulin (HCC)   [place] [facility] Hoy Register, MD   11 years ago Chest pain   [facility] at [facility], Tessa Lerner, MD       Future Appointments             In 1 week Park Meo, FNP [place] [facility]

## 2023-03-30 NOTE — Plan of Care (Signed)
[facility] Tonsillectomy/Adenoidectomy, Postoperative PEDS care plan entered in error.

## 2023-04-03 ENCOUNTER — Other Ambulatory Visit: Payer: Self-pay

## 2023-04-03 ENCOUNTER — Encounter: Payer: Self-pay | Admitting: Family Medicine

## 2023-04-05 ENCOUNTER — Ambulatory Visit (INDEPENDENT_AMBULATORY_CARE_PROVIDER_SITE_OTHER): Payer: BLUE CROSS/BLUE SHIELD | Admitting: Family Medicine

## 2023-04-05 ENCOUNTER — Encounter: Payer: Self-pay | Admitting: Family Medicine

## 2023-04-05 ENCOUNTER — Other Ambulatory Visit: Payer: Self-pay

## 2023-04-05 VITALS — BP 158/90 | HR 88 | Temp 97.9°F | Ht 76.0 in | Wt 272.0 lb

## 2023-04-05 DIAGNOSIS — F411 Generalized anxiety disorder: Secondary | ICD-10-CM

## 2023-04-05 DIAGNOSIS — I152 Hypertension secondary to endocrine disorders: Secondary | ICD-10-CM | POA: Diagnosis not present

## 2023-04-05 DIAGNOSIS — E1165 Type 2 diabetes mellitus with hyperglycemia: Secondary | ICD-10-CM | POA: Diagnosis not present

## 2023-04-05 DIAGNOSIS — F329 Major depressive disorder, single episode, unspecified: Secondary | ICD-10-CM

## 2023-04-05 DIAGNOSIS — E1159 Type 2 diabetes mellitus with other circulatory complications: Secondary | ICD-10-CM

## 2023-04-05 LAB — GLUCOSE 16585: Glucose: >444 mg/dL — ABNORMAL HIGH (ref 65–99)

## 2023-04-05 MED ORDER — ARIPIPRAZOLE 5 MG PO TABS
5.0000 mg | ORAL_TABLET | Freq: Every day | ORAL | 1 refills | Status: DC
  Filled 2023-04-05 – 2023-05-05 (×3): qty 30, 30d supply, fill #0
  Filled 2023-06-09 – 2023-07-23 (×2): qty 30, 30d supply, fill #1

## 2023-04-05 MED ORDER — AMLODIPINE BESYLATE 5 MG PO TABS
5.0000 mg | ORAL_TABLET | Freq: Every day | ORAL | 1 refills | Status: DC
  Filled 2023-04-05 – 2023-05-05 (×3): qty 30, 30d supply, fill #0
  Filled 2023-06-09 – 2023-11-25 (×3): qty 30, 30d supply, fill #1

## 2023-04-05 NOTE — Progress Notes (Unsigned)
Subjective:  HPI: Timothy [name] is a 52 y.o. male presenting on 04/05/2023 for Follow-up (F/u BS/health)   HPI Patient is in today for follow-up for his anxiety. He has not seen me in 4 months but has stopped taking Cymbalta and is taking Abilify 2mg  daily.  HYPERTENSION without Chronic Kidney Disease Hypertension status: uncontrolled  Satisfied with current treatment? yes Duration of hypertension: chronic BP monitoring frequency:  not checking BP range:  BP medication side effects:  no Medication compliance: excellent compliance Previous BP meds:amlodipine Aspirin: no Recurrent headaches: no Visual changes: yes, chronic Palpitations: no Dyspnea: no Chest pain: no Lower extremity edema: no Dizzy/lightheaded: no  DIABETES Hypoglycemic episodes:no Polydipsia/polyuria: no Visual disturbance: yes Chest pain: no Paresthesias: no Glucose Monitoring: yes  Accucheck frequency:  every other day  Fasting glucose: 498 today, 90-600s  Post prandial:  Evening:  Before meals: Taking Insulin?: no  Long acting insulin:  Short acting insulin: Blood Pressure Monitoring: not checking Retinal Examination: Up to Date Foot Exam: Up to Date Diabetic Education: Completed Pneumovax: Up to Date Influenza: Not up to Date Aspirin: no   Review of Systems  All other systems reviewed and are negative.   Relevant past medical history reviewed and updated as indicated.   Past Medical History:  Diagnosis Date  . Carpal tunnel syndrome on both sides 03/28/2013  . Chronic headache   . Diabetes mellitus without complication (HCC)   . DVT (deep venous thrombosis) (HCC)    2017-2018: 4 dvts  . GERD (gastroesophageal reflux disease)   . Helicobacter pylori gastritis 06/30/2006  . 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 Pneumonia   . Pulmonary embolism (HCC)    2013: 2 total  . TIA (transient ischemic attack)      Past Surgical History:  Procedure Laterality Date  . KNEE  SURGERY Right 10/2016    Allergies and medications reviewed and updated.   Current Outpatient Medications:  .  amLODipine (NORVASC) 2.5 MG tablet, Take 1 tablet (2.5 mg total) by mouth daily., Disp: 90 tablet, Rfl: 0 .  ARIPiprazole (ABILIFY) 2 MG tablet, Take 1 tablet (2 mg total) by mouth daily., Disp: 30 tablet, Rfl: 0 .  atorvastatin (LIPITOR) 40 MG tablet, Take 1 tablet (40 mg total) by mouth daily., Disp: 90 tablet, Rfl: 0 .  dapagliflozin propanediol (FARXIGA) 10 MG TABS tablet, Take 1 tablet (10 mg total) by mouth daily before breakfast., Disp: 30 tablet, Rfl: 6 .  DULoxetine (CYMBALTA) 60 MG capsule, Take 1 capsule (60 mg total) by mouth daily., Disp: 90 capsule, Rfl: 0 .  EPINEPHrine (EPIPEN 2-PAK) 0.3 mg/0.3 mL IJ SOAJ injection, Inject 0.3 mLs (0.3 mg total) into the muscle once as needed for up to 1 dose (for severe allergic reaction). CAll 911 immediately if you have to use this medicine, Disp: 2 each, Rfl: 0 .  gabapentin (NEURONTIN) 300 MG capsule, Take 2 capsules (600 mg total) by mouth 3 (three) times daily., Disp: 180 capsule, Rfl: 6 .  glucose blood (TRUE METRIX BLOOD GLUCOSE TEST) test strip, Use 3 times daily, Disp: 100 each, Rfl: 12 .  metFORMIN (GLUCOPHAGE) 1000 MG tablet, Take 1 tablet (1,000 mg total) by mouth 2 (two) times daily with a meal., Disp: 180 tablet, Rfl: 0 .  methadone (DOLOPHINE) 10 MG/ML solution, Take 70 mg by mouth daily., Disp: , Rfl:  .  omeprazole (PRILOSEC) 40 MG capsule, Take 1 capsule (40 mg total) by mouth daily., Disp: 90 capsule, Rfl: 0 .  rivaroxaban (  XARELTO) 20 MG TABS tablet, Take 1 tablet (20 mg total) by mouth daily with supper., Disp: 30 tablet, Rfl: 1 .  Semaglutide, 1 MG/DOSE, 4 MG/3ML SOPN, Inject 1 mg as directed once a week., Disp: 3 mL, Rfl: 0 .  TRUEplus Lancets 28G MISC, use to test blood sugar 3 (three) times daily before meals., Disp: 100 each, Rfl: 12  Allergies  Allergen Reactions  . Beef-Derived Products Anaphylaxis     Alpha Gal.   . Food Anaphylaxis    All mammalian meat - anaphylaxis (alpha gal)  . Ioxaglate Shortness Of Breath and Other (See Comments)    Dizziness   . Ivp Dye [Iodinated Contrast Media] Shortness Of Breath and Other (See Comments)    Dizziness   . Pork-Derived Products Anaphylaxis    Alpha Gal.  . Shellfish Allergy Swelling    Swelling of face/mouth. No airway restriction.    Objective:   BP (!) 158/90   Pulse 88   Temp 97.9 F (36.6 C) (Oral)   Ht 6\' 4"  (1.93 m)   Wt 272 lb (123.4 kg)   SpO2 96%   BMI 33.11 kg/m      04/05/2023    3:59 PM 04/05/2023    3:54 PM 11/09/2022    4:34 PM  Vitals with BMI  Height  6\' 4"    Weight  272 lbs   BMI  33.12   Systolic 158 160 235  Diastolic 90 90 82  Pulse  88      Physical Exam Vitals and nursing note reviewed.  Constitutional:      Appearance: Normal appearance. He is normal weight.  HENT:     Head: Normocephalic and atraumatic.  Cardiovascular:     Rate and Rhythm: Normal rate and regular rhythm.     Pulses: Normal pulses.     Heart sounds: Normal heart sounds.  Pulmonary:     Effort: Pulmonary effort is normal.     Breath sounds: Normal breath sounds.  Skin:    General: Skin is warm and dry.     Capillary Refill: Capillary refill takes less than 2 seconds.  Neurological:     General: No focal deficit present.     Mental Status: He is alert and oriented to person, place, and time. Mental status is at baseline.  Psychiatric:        Mood and Affect: Mood normal.        Behavior: Behavior normal.        Thought Content: Thought content normal.        Judgment: Judgment normal.    Assessment & Plan:  There are no diagnoses linked to this encounter.   Follow up plan: No follow-ups on file.  Park Meo, FNP

## 2023-04-06 ENCOUNTER — Telehealth: Payer: Self-pay | Admitting: Family Medicine

## 2023-04-06 ENCOUNTER — Encounter: Payer: Self-pay | Admitting: Family Medicine

## 2023-04-06 LAB — COMPLETE METABOLIC PANEL WITH GFR
AG Ratio: 1.5 (calc) (ref 1.0–2.5)
ALT: 29 U/L (ref 9–46)
AST: 23 U/L (ref 10–35)
Albumin: 4.4 g/dL (ref 3.6–5.1)
Alkaline phosphatase (APISO): 127 U/L (ref 35–144)
BUN: 19 mg/dL (ref 7–25)
CO2: 24 mmol/L (ref 20–32)
Calcium: 10 mg/dL (ref 8.6–10.3)
Chloride: 99 mmol/L (ref 98–110)
Creat: 0.84 mg/dL (ref 0.70–1.30)
Globulin: 2.9 g/dL (ref 1.9–3.7)
Glucose, Bld: 490 mg/dL — ABNORMAL HIGH (ref 65–99)
Potassium: 4.5 mmol/L (ref 3.5–5.3)
Sodium: 135 mmol/L (ref 135–146)
Total Bilirubin: 0.3 mg/dL (ref 0.2–1.2)
Total Protein: 7.3 g/dL (ref 6.1–8.1)
eGFR: 106 mL/min/{1.73_m2} (ref >=60)

## 2023-04-06 LAB — MICROALBUMIN / CREATININE URINE RATIO
Creatinine, Urine: 30 mg/dL (ref 20–320)
Microalb Creat Ratio: 13 mg/g{creat} (ref <30)
Microalb, Ur: 0.4 mg/dL

## 2023-04-06 LAB — HEMOGLOBIN A1C
Hgb A1c MFr Bld: 12.7 %{Hb} — ABNORMAL HIGH (ref <5.7)
Mean Plasma Glucose: 318 mg/dL
eAG (mmol/L): 17.6 mmol/L

## 2023-04-06 LAB — VITAMIN B12: Vitamin B-12: 783 pg/mL (ref 200–1100)

## 2023-04-06 NOTE — Assessment & Plan Note (Signed)
Uncontrolled. Increase Abilify to 5mg  daily. Denies SI/HI. Counseled on importance of medication compliance. His brother reports he is giving him this medication each day. Will also refer to psychiatry for uncontrolled symptoms.      04/05/2023    4:40 PM 10/13/2022    4:17 PM 04/12/2022    3:54 PM 12/21/2021    2:31 PM  GAD 7 : Generalized Anxiety Score  Nervous, Anxious, on Edge 3 2 0 2  Control/stop worrying 3 2 1 2   Worry too much - different things 3 2 1 2   Trouble relaxing 3 1 1 2   Restless 2 1 0 2  Easily annoyed or irritable 3 3 3 3   Afraid - awful might happen 2 2 1  0  Total GAD 7 Score 19 13 7 13   Anxiety Difficulty  Somewhat difficult

## 2023-04-06 NOTE — Assessment & Plan Note (Signed)
Chronic uncontrolled. 158/90 today in office. He is not monitoring at home. It is unclear if he is taking his medications. Counseled on importance of adherence to medication regimen. He will bring his list of home meds to office tomorrow. Recommend heart healthy diet such as Mediterranean diet with whole grains, fruits, vegetable, fish, lean meats, nuts, and olive oil. Limit salt. Encouraged moderate walking, 3-5 times/week for 30-50 minutes each session. Aim for at least 150 minutes.week. Goal should be pace of 3 miles/hours, or walking 1.5 miles in 30 minutes. Avoid tobacco products. Avoid excess alcohol. Take medications as prescribed and bring medications and blood pressure log with cuff to each office visit. Seek medical care for chest pain, palpitations, shortness of breath with exertion, dizziness/lightheadedness, vision changes, recurrent headaches, or swelling of extremities. Follow up in 2 weeks.

## 2023-04-06 NOTE — Assessment & Plan Note (Signed)
Uncontrolled. Increase Abilify to 5mg  daily. Denies SI/HI. Counseled on importance of medication compliance. His brother reports he is giving him this medication each day. Will also refer to psychiatry for uncontrolled symptoms.      04/05/2023    4:40 PM 11/09/2022    4:10 PM 10/13/2022    4:17 PM 04/12/2022    3:53 PM 12/21/2021    2:30 PM  Depression screen PHQ 2/9  Decreased Interest 2 0 2 3 3   Down, Depressed, Hopeless 2 0 2 2 2   PHQ - 2 Score 4 0 4 5 5   Altered sleeping 3 0 3 2 3   Tired, decreased energy 0 0 3 2 3   Change in appetite 1 0 3 2 3   Feeling bad or failure about yourself  1 0 3 2 2   Trouble concentrating 0 0 2 2 2   Moving slowly or fidgety/restless 0 0 2 0 0  Suicidal thoughts 0 0 0 0 0  PHQ-9 Score 9 0 20 15 18   Difficult doing work/chores Somewhat difficult Not difficult at all Somewhat difficult

## 2023-04-06 NOTE — Telephone Encounter (Signed)
Unable to reach the patient. I did speak with his brother and he is unsure his status. Patient did report to Christiane Ha, his brother, that he was going to go to the emergency room today. I reported the lab values to his brother and reinforced the importance of medication compliance.

## 2023-04-06 NOTE — Assessment & Plan Note (Addendum)
Last A1c 6.5%. BG unreadable in office today and was 498 at home this AM. I counseled him on importance of BG control and encouraged him to proceed to the emergency department from  my office. He refused and stated he would go tomorrow. I discussed his with the patient and his brother and explained to them the urgenecy of the situation and risks of BG this high. Labs drawn today. It is unclear if he is taking his medications. Counseled on importance of adherence to medication regimen. He will bring his list of home meds to office tomorrow. Urgent referral placed to pharmacy for phone visit given his difficulty in returning for office visits.  A1c and uACR UTD. Foot exam UTD. Vaccines UTD. Retinal eye exam UTD. Recommend heart healthy diet such as Mediterranean diet with whole grains, fruits, vegetable, fish, lean meats, nuts, and olive oil. Limit salt. Encouraged moderate walking, 3-5 times/week for 30-50 minutes each session. Aim for at least 150 minutes.week. Goal should be pace of 3 miles/hours, or walking 1.5 miles in 30 minutes. Seek medical care for urinary frequency, extreme thirst, vision changes, lightheadedness, dizziness.

## 2023-04-13 ENCOUNTER — Encounter: Payer: Self-pay | Admitting: Family Medicine

## 2023-04-17 ENCOUNTER — Telehealth: Payer: Self-pay

## 2023-04-17 ENCOUNTER — Other Ambulatory Visit: Payer: Self-pay

## 2023-04-17 NOTE — Progress Notes (Signed)
   Care Guide Note  04/17/2023 Name: Timothy [name] MRN: [id number] DOB: Apr 25, 1971  Referred by: Park Meo, FNP Reason for referral : Care Coordination (Outreach to schedule with Pharm d )   Timothy [name] is a 52 y.o. year old male who is a primary care patient of Park Meo, FNP. [name] was referred to the pharmacist for assistance related to DM.    An unsuccessful telephone outreach was attempted today to contact the patient who was referred to the pharmacy team for assistance with medication management. Additional attempts will be made to contact the patient.   Penne Lash, RMA Care Guide [facility]  [place], [place] [address] Direct Dial: [phone] [email or web address]

## 2023-04-19 ENCOUNTER — Ambulatory Visit: Payer: BLUE CROSS/BLUE SHIELD | Admitting: Family Medicine

## 2023-04-21 ENCOUNTER — Other Ambulatory Visit: Payer: Self-pay | Admitting: Family Medicine

## 2023-04-21 ENCOUNTER — Other Ambulatory Visit: Payer: Self-pay

## 2023-04-21 DIAGNOSIS — F329 Major depressive disorder, single episode, unspecified: Secondary | ICD-10-CM

## 2023-04-21 DIAGNOSIS — G5603 Carpal tunnel syndrome, bilateral upper limbs: Secondary | ICD-10-CM

## 2023-04-21 NOTE — Progress Notes (Signed)
   Care Guide Note  04/21/2023 Name: Timothy [name] MRN: [id number] DOB: [birth year]  Referred by: Park Meo, FNP Reason for referral : Care Coordination (Outreach to schedule with Pharm d )   [name] is a 52 y.o. year old male who is a primary care patient of Park Meo, FNP. [name] was referred to the pharmacist for assistance related to DM.    A third unsuccessful telephone outreach was attempted today to contact the patient who was referred to the pharmacy team for assistance with medication management. The Population Health team is pleased to engage with this patient at any time in the future upon receipt of referral and should he/she be interested in assistance from the [facility] team.   Penne Lash, RMA Care Guide [facility]  [place], [place] [address] Direct Dial: [phone] [email or web address]

## 2023-04-24 ENCOUNTER — Other Ambulatory Visit: Payer: Self-pay

## 2023-04-24 MED ORDER — DULOXETINE HCL 60 MG PO CPEP
60.0000 mg | ORAL_CAPSULE | Freq: Every day | ORAL | 0 refills | Status: AC
  Filled 2023-04-24 – 2023-05-05 (×2): qty 90, 90d supply, fill #0

## 2023-04-25 ENCOUNTER — Ambulatory Visit: Payer: BLUE CROSS/BLUE SHIELD | Admitting: Family Medicine

## 2023-05-01 ENCOUNTER — Other Ambulatory Visit: Payer: Self-pay

## 2023-05-02 ENCOUNTER — Other Ambulatory Visit: Payer: Self-pay

## 2023-05-05 ENCOUNTER — Other Ambulatory Visit: Payer: Self-pay

## 2023-05-05 ENCOUNTER — Other Ambulatory Visit: Payer: Self-pay | Admitting: Family Medicine

## 2023-05-05 DIAGNOSIS — E1169 Type 2 diabetes mellitus with other specified complication: Secondary | ICD-10-CM

## 2023-05-08 ENCOUNTER — Other Ambulatory Visit: Payer: Self-pay

## 2023-05-08 ENCOUNTER — Telehealth: Payer: Self-pay

## 2023-05-08 MED ORDER — METFORMIN HCL 1000 MG PO TABS
1000.0000 mg | ORAL_TABLET | Freq: Two times a day (BID) | ORAL | 0 refills | Status: DC
  Filled 2023-05-08 – 2023-11-25 (×3): qty 180, 90d supply, fill #0

## 2023-05-08 MED ORDER — ATORVASTATIN CALCIUM 40 MG PO TABS
40.0000 mg | ORAL_TABLET | Freq: Every day | ORAL | 0 refills | Status: AC
  Filled 2023-05-08 – 2023-07-23 (×3): qty 90, 90d supply, fill #0

## 2023-05-08 NOTE — Telephone Encounter (Signed)
LVM per pt's DPR,pt need to make an appt to come in to do a diabetic eye exam.

## 2023-05-09 ENCOUNTER — Other Ambulatory Visit: Payer: Self-pay

## 2023-05-16 ENCOUNTER — Other Ambulatory Visit: Payer: Self-pay

## 2023-05-16 ENCOUNTER — Emergency Department (HOSPITAL_COMMUNITY): Payer: MEDICAID

## 2023-05-16 ENCOUNTER — Emergency Department (HOSPITAL_COMMUNITY)
Admission: EM | Admit: 2023-05-16 | Discharge: 2023-05-16 | Discharge status: Against Medical Advice | Payer: MEDICAID | Attending: Emergency Medicine | Admitting: Emergency Medicine

## 2023-05-16 ENCOUNTER — Encounter (HOSPITAL_COMMUNITY): Payer: Self-pay | Admitting: Emergency Medicine

## 2023-05-16 DIAGNOSIS — R739 Hyperglycemia, unspecified: Secondary | ICD-10-CM | POA: Diagnosis not present

## 2023-05-16 DIAGNOSIS — Z5321 Procedure and treatment not carried out due to patient leaving prior to being seen by health care provider: Secondary | ICD-10-CM | POA: Insufficient documentation

## 2023-05-16 DIAGNOSIS — R079 Chest pain, unspecified: Secondary | ICD-10-CM | POA: Insufficient documentation

## 2023-05-16 LAB — RAPID URINE DRUG SCREEN, HOSP PERFORMED
Amphetamines: POSITIVE — AB
Barbiturates: NOT DETECTED
Benzodiazepines: NOT DETECTED
Cocaine: NOT DETECTED
Opiates: NOT DETECTED
Tetrahydrocannabinol: NOT DETECTED

## 2023-05-16 LAB — CBC
HCT: 41.4 % (ref 39.0–52.0)
Hemoglobin: 13.6 g/dL (ref 13.0–17.0)
MCH: 29.9 pg (ref 26.0–34.0)
MCHC: 32.9 g/dL (ref 30.0–36.0)
MCV: 91 fL (ref 80.0–100.0)
Platelets: 128 10*3/uL — ABNORMAL LOW (ref 150–400)
RBC: 4.55 MIL/uL (ref 4.22–5.81)
RDW: 13.9 % (ref 11.5–15.5)
WBC: 7.3 10*3/uL (ref 4.0–10.5)
nRBC: 0 % (ref 0.0–0.2)

## 2023-05-16 LAB — BASIC METABOLIC PANEL
Anion gap: 14 (ref 5–15)
BUN: 10 mg/dL (ref 6–20)
CO2: 22 mmol/L (ref 22–32)
Calcium: 9 mg/dL (ref 8.9–10.3)
Chloride: 99 mmol/L (ref 98–111)
Creatinine, Ser: 0.68 mg/dL (ref 0.61–1.24)
GFR, Estimated: >60 mL/min (ref >60)
Glucose, Bld: 367 mg/dL — ABNORMAL HIGH (ref 70–99)
Potassium: 3.8 mmol/L (ref 3.5–5.1)
Sodium: 135 mmol/L (ref 135–145)

## 2023-05-16 LAB — TROPONIN I (HIGH SENSITIVITY): Troponin I (High Sensitivity): 7 ng/L (ref <18)

## 2023-05-16 LAB — CBG MONITORING, ED: Glucose-Capillary: 365 mg/dL — ABNORMAL HIGH (ref 70–99)

## 2023-05-16 NOTE — ED Provider Triage Note (Signed)
Emergency Medicine Provider Triage Evaluation Note  Timothy [name] , a 52 y.o. male  was evaluated in triage.  Pt with multiple complaints.  Woke up this morning with chest pain, radiating down left arm.  No prior cardiac history.  Also feels like he is going through withdrawals, missed his methadone yesterday.  Was also started on Abilify which he thinks is making him feel worse.  CBG elevated with EMS, takes metformin at home and reports compliance.  Review of Systems  Positive: Mult complaints Negative: Fever/chills  Physical Exam  BP (!) 143/87 (BP Location: Right Arm)   Pulse 92   Temp 98.2 F (36.8 C) (Oral)   Resp 18   Ht 6\' 4"  (1.93 m)   Wt 123 kg   SpO2 95%   BMI 33.01 kg/m  Gen:   Awake, no distress   Resp:  Normal effort  MSK:   Moves extremities without difficulty  Other:    Medical Decision Making  Medically screening exam initiated at 5:30 AM.  Appropriate orders placed.  Timothy [name] was informed that the remainder of the evaluation will be completed by another provider, this initial triage assessment does not replace that evaluation, and the importance of remaining in the ED until their evaluation is complete.  Multiple complaints--chest pain, withdrawal, hyperglycemia.  Vitals are stable, not currently diaphoretic or vomiting.  Labs, UDS, chest x-ray pending.  EKG without acute ischemia.   Garlon Hatchet, PA-C 05/16/23 [phone]

## 2023-05-16 NOTE — ED Notes (Signed)
Pt was called for vital update x3 no answer will try again.

## 2023-05-16 NOTE — ED Notes (Signed)
Pt states he is feeling better and going home.  Family here with pt.  Encouraged to stay but he states he is going to leave.

## 2023-05-16 NOTE — ED Triage Notes (Signed)
Patient from home w/ sudden onset of chest pain, radiating down to left arm. Patient initially that he was going through withdrawals, takes methadone. CBG also elevated.   150/96 HR 96 SR  CBG 506  150 mls of fluid received via EMS.

## 2023-05-18 ENCOUNTER — Ambulatory Visit: Payer: BLUE CROSS/BLUE SHIELD | Admitting: Family Medicine

## 2023-05-23 ENCOUNTER — Ambulatory Visit: Payer: BLUE CROSS/BLUE SHIELD | Admitting: Family Medicine

## 2023-06-06 ENCOUNTER — Ambulatory Visit: Payer: Self-pay

## 2023-06-10 ENCOUNTER — Other Ambulatory Visit: Payer: Self-pay

## 2023-06-12 ENCOUNTER — Other Ambulatory Visit: Payer: Self-pay

## 2023-06-21 ENCOUNTER — Other Ambulatory Visit: Payer: Self-pay

## 2023-06-24 ENCOUNTER — Other Ambulatory Visit: Payer: Self-pay

## 2023-06-24 ENCOUNTER — Emergency Department (HOSPITAL_BASED_OUTPATIENT_CLINIC_OR_DEPARTMENT_OTHER)
Admission: EM | Admit: 2023-06-24 | Discharge: 2023-06-25 | Disposition: A | Discharge status: Home / Self Care | Payer: MEDICAID | Attending: Emergency Medicine | Admitting: Emergency Medicine

## 2023-06-24 ENCOUNTER — Encounter (HOSPITAL_BASED_OUTPATIENT_CLINIC_OR_DEPARTMENT_OTHER): Payer: Self-pay

## 2023-06-24 DIAGNOSIS — E1165 Type 2 diabetes mellitus with hyperglycemia: Secondary | ICD-10-CM | POA: Diagnosis not present

## 2023-06-24 DIAGNOSIS — Z7984 Long term (current) use of oral hypoglycemic drugs: Secondary | ICD-10-CM | POA: Diagnosis not present

## 2023-06-24 DIAGNOSIS — Z20822 Contact with and (suspected) exposure to covid-19: Secondary | ICD-10-CM | POA: Insufficient documentation

## 2023-06-24 DIAGNOSIS — R739 Hyperglycemia, unspecified: Secondary | ICD-10-CM

## 2023-06-24 DIAGNOSIS — Z7901 Long term (current) use of anticoagulants: Secondary | ICD-10-CM | POA: Diagnosis not present

## 2023-06-24 DIAGNOSIS — M79604 Pain in right leg: Secondary | ICD-10-CM | POA: Diagnosis present

## 2023-06-24 LAB — URINALYSIS, ROUTINE W REFLEX MICROSCOPIC
Bacteria, UA: NONE SEEN
Bilirubin Urine: NEGATIVE
Glucose, UA: >1000 mg/dL — AB
Hgb urine dipstick: NEGATIVE
Ketones, ur: NEGATIVE mg/dL
Leukocytes,Ua: NEGATIVE
Nitrite: NEGATIVE
Protein, ur: NEGATIVE mg/dL
Specific Gravity, Urine: 1.042 — ABNORMAL HIGH (ref 1.005–1.030)
pH: 5 (ref 5.0–8.0)

## 2023-06-24 LAB — I-STAT VENOUS BLOOD GAS, ED
Acid-Base Excess: 2 mmol/L (ref 0.0–2.0)
Bicarbonate: 26.9 mmol/L (ref 20.0–28.0)
Calcium, Ion: 1.19 mmol/L (ref 1.15–1.40)
HCT: 40 % (ref 39.0–52.0)
Hemoglobin: 13.6 g/dL (ref 13.0–17.0)
O2 Saturation: 75 %
Patient temperature: 99.2
Potassium: 3.8 mmol/L (ref 3.5–5.1)
Sodium: 138 mmol/L (ref 135–145)
TCO2: 28 mmol/L (ref 22–32)
pCO2, Ven: 44.5 mm[Hg] (ref 44–60)
pH, Ven: 7.391 (ref 7.25–7.43)
pO2, Ven: 41 mm[Hg] (ref 32–45)

## 2023-06-24 LAB — CBC
HCT: 42.1 % (ref 39.0–52.0)
Hemoglobin: 14 g/dL (ref 13.0–17.0)
MCH: 30 pg (ref 26.0–34.0)
MCHC: 33.3 g/dL (ref 30.0–36.0)
MCV: 90.3 fL (ref 80.0–100.0)
Platelets: 154 10*3/uL (ref 150–400)
RBC: 4.66 MIL/uL (ref 4.22–5.81)
RDW: 14.5 % (ref 11.5–15.5)
WBC: 6 10*3/uL (ref 4.0–10.5)
nRBC: 0 % (ref 0.0–0.2)

## 2023-06-24 LAB — BASIC METABOLIC PANEL
Anion gap: 7 (ref 5–15)
BUN: 14 mg/dL (ref 6–20)
CO2: 28 mmol/L (ref 22–32)
Calcium: 9.6 mg/dL (ref 8.9–10.3)
Chloride: 101 mmol/L (ref 98–111)
Creatinine, Ser: 0.77 mg/dL (ref 0.61–1.24)
GFR, Estimated: >60 mL/min (ref >60)
Glucose, Bld: 388 mg/dL — ABNORMAL HIGH (ref 70–99)
Potassium: 3.8 mmol/L (ref 3.5–5.1)
Sodium: 136 mmol/L (ref 135–145)

## 2023-06-24 LAB — TROPONIN I (HIGH SENSITIVITY): Troponin I (High Sensitivity): 6 ng/L (ref <18)

## 2023-06-24 LAB — CBG MONITORING, ED: Glucose-Capillary: 385 mg/dL — ABNORMAL HIGH (ref 70–99)

## 2023-06-24 LAB — LACTIC ACID, PLASMA: Lactic Acid, Venous: 0.8 mmol/L (ref 0.5–1.9)

## 2023-06-24 MED ORDER — LACTATED RINGERS IV BOLUS
1000.0000 mL | Freq: Once | INTRAVENOUS | Status: AC
  Administered 2023-06-24: 1000 mL via INTRAVENOUS

## 2023-06-24 MED ORDER — DIPHENHYDRAMINE HCL 25 MG PO CAPS
50.0000 mg | ORAL_CAPSULE | Freq: Once | ORAL | Status: AC
  Filled 2023-06-24: qty 2

## 2023-06-24 MED ORDER — METHYLPREDNISOLONE SODIUM SUCC 40 MG IJ SOLR
40.0000 mg | Freq: Once | INTRAMUSCULAR | Status: AC
  Administered 2023-06-24: 40 mg via INTRAVENOUS
  Filled 2023-06-24: qty 1

## 2023-06-24 MED ORDER — INSULIN ASPART 100 UNIT/ML IJ SOLN
10.0000 [IU] | Freq: Once | INTRAMUSCULAR | Status: AC
  Administered 2023-06-25: 10 [IU] via SUBCUTANEOUS

## 2023-06-24 MED ORDER — DIPHENHYDRAMINE HCL 50 MG/ML IJ SOLN
50.0000 mg | Freq: Once | INTRAMUSCULAR | Status: AC
  Administered 2023-06-25: 50 mg via INTRAVENOUS
  Filled 2023-06-24: qty 1

## 2023-06-24 NOTE — ED Provider Notes (Signed)
[facility] Provider Note   CSN: [id number] Arrival date & time: 06/24/23  2110     History {Add pertinent medical, surgical, social history, OB history to HPI:1} Chief Complaint  Patient presents with   Hyperglycemia    Timothy [name] is a 53 y.o. male.  Patient with a history of diabetes, GERD, PE on Xarelto, previous TIA presents with multiple complaints.  States that sugars have been high over 300 for the past 2 weeks.  He has been compliant with his metformin.  He is not on anything else for his diabetes and has not been on insulin but would like to be.  He has had increased thirst.  He is also had a cough and shortness of breath over the past 1 week.  Feels shortness of breath especially with exertion and has a dry cough.  Denies any missed doses of his Xarelto.  His third complaint is ongoing right leg pain for the past 2 months.  He feels burning from his right knee down to his ankle.  No calf tenderness.  States has been on Xarelto for several years and not missed any doses.  No fever.  No pain with urination or blood in the urine.  No chest pain.  Does have shortness of breath worse with exertion and coughing.  Has had some runny nose and sore throat as well.  No travel or sick contacts.  The history is provided by the patient.  Hyperglycemia Associated symptoms: fatigue, shortness of breath and weakness   Associated symptoms: no abdominal pain, no chest pain, no dizziness, no dysuria, no fever, no nausea and no vomiting        Home Medications Prior to Admission medications   Medication Sig Start Date End Date Taking? Authorizing Provider  amLODipine (NORVASC) 5 MG tablet Take 1 tablet (5 mg total) by mouth daily. 04/05/23   Park Meo, FNP  ARIPiprazole (ABILIFY) 5 MG tablet Take 1 tablet (5 mg total) by mouth daily. 04/05/23   Park Meo, FNP  atorvastatin (LIPITOR) 40 MG tablet Take 1 tablet (40 mg total) by mouth  daily. 05/08/23   Park Meo, FNP  dapagliflozin propanediol (FARXIGA) 10 MG TABS tablet Take 1 tablet (10 mg total) by mouth daily before breakfast. 06/21/22   Hoy Register, MD  DULoxetine (CYMBALTA) 60 MG capsule Take 1 capsule (60 mg total) by mouth daily. 04/24/23   Park Meo, FNP  EPINEPHrine (EPIPEN 2-PAK) 0.3 mg/0.3 mL IJ SOAJ injection Inject 0.3 mLs (0.3 mg total) into the muscle once as needed for up to 1 dose (for severe allergic reaction). CAll 911 immediately if you have to use this medicine 04/01/19   Kozlow, Alvira Philips, MD  gabapentin (NEURONTIN) 300 MG capsule Take 2 capsules (600 mg total) by mouth 3 (three) times daily. 03/22/22   Hoy Register, MD  glucose blood (TRUE METRIX BLOOD GLUCOSE TEST) test strip Use 3 times daily 01/11/23   Park Meo, FNP  metFORMIN (GLUCOPHAGE) 1000 MG tablet Take 1 tablet (1,000 mg total) by mouth 2 (two) times daily with a meal. 05/08/23   Park Meo, FNP  methadone (DOLOPHINE) 10 MG/ML solution Take 70 mg by mouth daily.    [provider]  omeprazole (PRILOSEC) 40 MG capsule Take 1 capsule (40 mg total) by mouth daily. 03/27/23   Park Meo, FNP  rivaroxaban (XARELTO) 20 MG TABS tablet Take 1 tablet (20 mg total) by mouth  daily with supper. 12/06/22   Park Meo, FNP  Semaglutide, 1 MG/DOSE, 4 MG/3ML SOPN Inject 1 mg as directed once a week. 11/09/22   Park Meo, FNP  TRUEplus Lancets 28G MISC use to test blood sugar 3 (three) times daily before meals. 10/19/21   Hoy Register, MD      Allergies    Beef-derived drug products, Food, Ioxaglate, Ivp dye [iodinated contrast media], Pork-derived products, and Shellfish allergy    Review of Systems   Review of Systems  Constitutional:  Positive for fatigue. Negative for fever.  HENT:  Positive for congestion and rhinorrhea.   Respiratory:  Positive for cough, chest tightness and shortness of breath.   Cardiovascular:  Negative for chest pain.   Gastrointestinal:  Negative for abdominal pain, nausea and vomiting.  Genitourinary:  Negative for dysuria.  Musculoskeletal:  Positive for arthralgias and myalgias.  Skin:  Negative for rash.  Neurological:  Positive for weakness. Negative for dizziness, light-headedness and headaches.   all other systems are negative except as noted in the HPI and PMH.    Physical Exam Updated Vital Signs BP (!) 129/98   Pulse 86   Temp 99.2 F (37.3 C) (Oral)   Resp 16   Ht 6\' 4"  (1.93 m)   Wt 83.9 kg   SpO2 97%   BMI 22.52 kg/m  Physical Exam Vitals and nursing note reviewed.  Constitutional:      General: He is not in acute distress.    Appearance: He is well-developed.  HENT:     Head: Normocephalic and atraumatic.     Mouth/Throat:     Mouth: Mucous membranes are dry.     Pharynx: No oropharyngeal exudate.  Eyes:     Conjunctiva/sclera: Conjunctivae normal.     Pupils: Pupils are equal, round, and reactive to light.  Neck:     Comments: No meningismus. Cardiovascular:     Rate and Rhythm: Normal rate and regular rhythm.     Heart sounds: Normal heart sounds. No murmur heard. Pulmonary:     Effort: Pulmonary effort is normal. No respiratory distress.     Breath sounds: Normal breath sounds.  Abdominal:     Palpations: Abdomen is soft.     Tenderness: There is no abdominal tenderness. There is no guarding or rebound.  Musculoskeletal:        General: No tenderness. Normal range of motion.     Cervical back: Normal range of motion and neck supple.     Comments: Right calf tenderness without significant asymmetry.  Intact DP and PT pulses bilaterally.  Skin:    General: Skin is warm.  Neurological:     Mental Status: He is alert and oriented to person, place, and time.     Cranial Nerves: No cranial nerve deficit.     Motor: No abnormal muscle tone.     Coordination: Coordination normal.     Comments:  5/5 strength throughout. CN 2-12 intact.Equal grip strength.    Psychiatric:        Behavior: Behavior normal.     ED Results / Procedures / Treatments   Labs (all labs ordered are listed, but only abnormal results are displayed) Labs Reviewed  BASIC METABOLIC PANEL - Abnormal; Notable for the following components:      Result Value   Glucose, Bld 388 (*)    All other components within normal limits  URINALYSIS, ROUTINE W REFLEX MICROSCOPIC - Abnormal; Notable for the following components:  Specific Gravity, Urine 1.042 (*)    Glucose, UA >1,000 (*)    All other components within normal limits  CBG MONITORING, ED - Abnormal; Notable for the following components:   Glucose-Capillary 385 (*)    All other components within normal limits  RESP PANEL BY RT-PCR (RSV, FLU A&B, COVID)  RVPGX2  CBC  LACTIC ACID, PLASMA  LACTIC ACID, PLASMA  BLOOD GAS, VENOUS  BETA-HYDROXYBUTYRIC ACID  BRAIN NATRIURETIC PEPTIDE  CBG MONITORING, ED  I-STAT VENOUS BLOOD GAS, ED  TROPONIN I (HIGH SENSITIVITY)    EKG None  Radiology No results found.  Procedures Procedures  {Document cardiac monitor, telemetry assessment procedure when appropriate:1}  Medications Ordered in ED Medications  methylPREDNISolone sodium succinate (SOLU-MEDROL) 40 mg/mL injection 40 mg (has no administration in time range)  diphenhydrAMINE (BENADRYL) capsule 50 mg (has no administration in time range)    Or  diphenhydrAMINE (BENADRYL) injection 50 mg (has no administration in time range)  lactated ringers bolus 1,000 mL (has no administration in time range)  insulin regular (NOVOLIN R) 100 units/mL injection 10 Units (has no administration in time range)    ED Course/ Medical Decision Making/ A&P   {   Click here for ABCD2, HEART and other calculatorsREFRESH Note before signing :1}                              Medical Decision Making Amount and/or Complexity of Data Reviewed Labs: ordered. Decision-making details documented in ED Course. Radiology: ordered and  independent interpretation performed. Decision-making details documented in ED Course. ECG/medicine tests: ordered and independent interpretation performed. Decision-making details documented in ED Course.  Risk OTC drugs. Prescription drug management.  Hyperglycemia, shortness of breath, cough, right leg pain.  Stable vital signs.  No distress.  Abdomen soft without peritoneal signs.  EKG is sinus rhythm with nonspecific T wave inversions inferiorly similar to previous.  Lab show hyperglycemia without DKA.  Will hydrate and give insulin.  Patient also complains of shortness of breath, cough, runny nose and sore throat.  Checking chest x-ray as well as COVID and flu swabs. {Document critical care time when appropriate:1} {Document review of labs and clinical decision tools ie heart score, Chads2Vasc2 etc:1}  {Document your independent review of radiology images, and any outside records:1} {Document your discussion with family members, caretakers, and with consultants:1} {Document social determinants of health affecting pt's care:1} {Document your decision making why or why not admission, treatments were needed:1} Final Clinical Impression(s) / ED Diagnoses Final diagnoses:  None    Rx / DC Orders ED Discharge Orders     None

## 2023-06-24 NOTE — ED Triage Notes (Signed)
Pt POV d/t CBG at home 725.  Pt has been taking care of both parents and putting his health off.  Pt  also states he has been having SOB with and has known DVT in right calf.  He takes Xarelto already.  He states right calf seems warmer.

## 2023-06-25 ENCOUNTER — Emergency Department (HOSPITAL_BASED_OUTPATIENT_CLINIC_OR_DEPARTMENT_OTHER): Payer: MEDICAID

## 2023-06-25 ENCOUNTER — Ambulatory Visit (HOSPITAL_BASED_OUTPATIENT_CLINIC_OR_DEPARTMENT_OTHER): Payer: MEDICAID

## 2023-06-25 LAB — TROPONIN I (HIGH SENSITIVITY): Troponin I (High Sensitivity): 6 ng/L (ref <18)

## 2023-06-25 LAB — BASIC METABOLIC PANEL
Anion gap: 7 (ref 5–15)
BUN: 12 mg/dL (ref 6–20)
CO2: 28 mmol/L (ref 22–32)
Calcium: 8.9 mg/dL (ref 8.9–10.3)
Chloride: 104 mmol/L (ref 98–111)
Creatinine, Ser: 0.66 mg/dL (ref 0.61–1.24)
GFR, Estimated: >60 mL/min (ref >60)
Glucose, Bld: 265 mg/dL — ABNORMAL HIGH (ref 70–99)
Potassium: 3.7 mmol/L (ref 3.5–5.1)
Sodium: 139 mmol/L (ref 135–145)

## 2023-06-25 LAB — RESP PANEL BY RT-PCR (RSV, FLU A&B, COVID)  RVPGX2
Influenza A by PCR: NEGATIVE
Influenza B by PCR: NEGATIVE
Resp Syncytial Virus by PCR: NEGATIVE
SARS Coronavirus 2 by RT PCR: NEGATIVE

## 2023-06-25 LAB — BETA-HYDROXYBUTYRIC ACID: Beta-Hydroxybutyric Acid: 0.29 mmol/L — ABNORMAL HIGH (ref 0.05–0.27)

## 2023-06-25 LAB — CBG MONITORING, ED
Glucose-Capillary: 253 mg/dL — ABNORMAL HIGH (ref 70–99)
Glucose-Capillary: 214 mg/dL — ABNORMAL HIGH (ref 70–99)

## 2023-06-25 MED ORDER — IOHEXOL 350 MG/ML SOLN
100.0000 mL | Freq: Once | INTRAVENOUS | Status: AC | PRN
  Administered 2023-06-25: 75 mL via INTRAVENOUS

## 2023-06-25 NOTE — ED Notes (Addendum)
Pt ambulated with sp02 of 98%.Pt denies any sob.

## 2023-06-25 NOTE — Discharge Instructions (Signed)
Monitor your blood sugars carefully at home and follow-up with your doctor for insulin adjustments.  No evidence of heart attack or blood clot in the lung today.  Follow-up tomorrow to have an ultrasound of your leg to rule out blood clot.  Continue your Xarelto.  Return to the ED with chest pain, exertional shortness of breath, nausea, vomiting, sweating or other concerns.

## 2023-07-24 ENCOUNTER — Other Ambulatory Visit: Payer: Self-pay

## 2023-07-24 ENCOUNTER — Encounter (HOSPITAL_COMMUNITY): Payer: Self-pay

## 2023-07-24 ENCOUNTER — Emergency Department (HOSPITAL_COMMUNITY)
Admission: EM | Admit: 2023-07-24 | Discharge: 2023-07-24 | Disposition: A | Discharge status: Home / Self Care | Payer: BLUE CROSS/BLUE SHIELD | Attending: Emergency Medicine | Admitting: Emergency Medicine

## 2023-07-24 DIAGNOSIS — Z79899 Other long term (current) drug therapy: Secondary | ICD-10-CM | POA: Diagnosis not present

## 2023-07-24 DIAGNOSIS — T783XXA Angioneurotic edema, initial encounter: Secondary | ICD-10-CM | POA: Insufficient documentation

## 2023-07-24 DIAGNOSIS — W293XXA Contact with powered garden and outdoor hand tools and machinery, initial encounter: Secondary | ICD-10-CM | POA: Diagnosis not present

## 2023-07-24 DIAGNOSIS — E119 Type 2 diabetes mellitus without complications: Secondary | ICD-10-CM | POA: Diagnosis not present

## 2023-07-24 DIAGNOSIS — S61210A Laceration without foreign body of right index finger without damage to nail, initial encounter: Secondary | ICD-10-CM

## 2023-07-24 DIAGNOSIS — S6991XA Unspecified injury of right wrist, hand and finger(s), initial encounter: Secondary | ICD-10-CM | POA: Diagnosis present

## 2023-07-24 DIAGNOSIS — T7840XA Allergy, unspecified, initial encounter: Secondary | ICD-10-CM

## 2023-07-24 MED ORDER — EPINEPHRINE 0.3 MG/0.3ML IJ SOAJ: 0.3000 mg | Freq: Once | INTRAMUSCULAR | 0 refills | Status: AC | PRN

## 2023-07-24 MED ORDER — METHYLPREDNISOLONE SODIUM SUCC 125 MG IJ SOLR
125.0000 mg | Freq: Once | INTRAMUSCULAR | Status: AC
  Administered 2023-07-24: 125 mg via INTRAVENOUS
  Filled 2023-07-24: qty 2

## 2023-07-24 MED ORDER — DIPHENHYDRAMINE HCL 50 MG/ML IJ SOLN
25.0000 mg | Freq: Once | INTRAMUSCULAR | Status: AC
  Administered 2023-07-24: 25 mg via INTRAVENOUS
  Filled 2023-07-24: qty 1

## 2023-07-24 MED ORDER — FAMOTIDINE IN NACL 20-0.9 MG/50ML-% IV SOLN
20.0000 mg | Freq: Once | INTRAVENOUS | Status: AC
Start: 2023-07-24 — End: 2023-07-24
  Administered 2023-07-24: 20 mg via INTRAVENOUS
  Filled 2023-07-24: qty 50

## 2023-07-24 MED ORDER — EPINEPHRINE 0.3 MG/0.3ML IJ SOAJ: 0.3000 mg | Freq: Once | INTRAMUSCULAR | Status: AC

## 2023-07-24 MED ORDER — EPINEPHRINE 0.3 MG/0.3ML IJ SOAJ
INTRAMUSCULAR | Status: AC
Start: 2023-07-24 — End: 2023-07-24
  Administered 2023-07-24: 0.3 mg via INTRAMUSCULAR
  Filled 2023-07-24: qty 0.3

## 2023-07-24 NOTE — ED Triage Notes (Signed)
Pt arrived POV reporting allergic reaction to hamburger. Tongue swelling noted. Pt has voice muffled. States he took epi shot 30 min PTA. PA at bedside for eval

## 2023-07-24 NOTE — ED Provider Notes (Signed)
EMERGENCY DEPARTMENT AT [facility] Provider Note   CSN: [id number] Arrival date & time: 07/24/23  1627     History  Chief Complaint  Patient presents with   Allergic Reaction   Oral Swelling    Timothy [name] is a 53 y.o. male.   Allergic Reaction Patient with history of alpha gal allergy.  Thinks he ate some beef.  States happened around 30 minutes prior to arrival.  Tongue started swelling and had hives on his back.  Got EpiPen prior to arrival but it was old.  Screened prior to me and given Benadryl Pepcid and EpiPen.  Did take children's Benadryl prior to arriving.  Has had previous allergy where he required intubation.  States he is feeling somewhat better.  States his tongue has been stable since the injection.    Past Medical History:  Diagnosis Date   Carpal tunnel syndrome on both sides 03/28/2013   Chronic headache    Diabetes mellitus without complication (HCC)    DVT (deep venous thrombosis) (HCC)    2017-2018: 4 dvts   GERD (gastroesophageal reflux disease)    Helicobacter pylori gastritis 06/30/2006   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   Pneumonia    Pulmonary embolism (HCC)    2013: 2 total   TIA (transient ischemic attack)     Home Medications Prior to Admission medications   Medication Sig Start Date End Date Taking? Authorizing Provider  amLODipine (NORVASC) 5 MG tablet Take 1 tablet (5 mg total) by mouth daily. 04/05/23   Park Meo, FNP  ARIPiprazole (ABILIFY) 5 MG tablet Take 1 tablet (5 mg total) by mouth daily. 04/05/23   Park Meo, FNP  atorvastatin (LIPITOR) 40 MG tablet Take 1 tablet (40 mg total) by mouth daily. 05/08/23   Park Meo, FNP  dapagliflozin propanediol (FARXIGA) 10 MG TABS tablet Take 1 tablet (10 mg total) by mouth daily before breakfast. 06/21/22   Hoy Register, MD  DULoxetine (CYMBALTA) 60 MG capsule Take 1 capsule (60 mg total) by mouth daily. 04/24/23   Park Meo, FNP   EPINEPHrine (EPIPEN 2-PAK) 0.3 mg/0.3 mL IJ SOAJ injection Inject 0.3 mg into the muscle once as needed for up to 1 dose (for severe allergic reaction). CAll 911 immediately if you have to use this medicine 07/24/23   Benjiman Core, MD  gabapentin (NEURONTIN) 300 MG capsule Take 2 capsules (600 mg total) by mouth 3 (three) times daily. 03/22/22   Hoy Register, MD  glucose blood (TRUE METRIX BLOOD GLUCOSE TEST) test strip Use 3 times daily 01/11/23   Park Meo, FNP  metFORMIN (GLUCOPHAGE) 1000 MG tablet Take 1 tablet (1,000 mg total) by mouth 2 (two) times daily with a meal. 05/08/23   Park Meo, FNP  methadone (DOLOPHINE) 10 MG/ML solution Take 70 mg by mouth daily.    [provider]  omeprazole (PRILOSEC) 40 MG capsule Take 1 capsule (40 mg total) by mouth daily. 03/27/23   Park Meo, FNP  rivaroxaban (XARELTO) 20 MG TABS tablet Take 1 tablet (20 mg total) by mouth daily with supper. 12/06/22   Park Meo, FNP  Semaglutide, 1 MG/DOSE, 4 MG/3ML SOPN Inject 1 mg as directed once a week. 11/09/22   Park Meo, FNP  TRUEplus Lancets 28G MISC use to test blood sugar 3 (three) times daily before meals. 10/19/21   Hoy Register, MD      Allergies    Beef-derived drug products,  Food, Ioxaglate, Ivp dye [iodinated contrast media], Pork-derived products, and Shellfish allergy    Review of Systems   Review of Systems  Physical Exam Updated Vital Signs BP (!) 157/93   Pulse 96   Temp 98.1 F (36.7 C) (Axillary)   Resp 13   Ht 6\' 4"  (1.93 m)   Wt 83 kg   SpO2 100%   BMI 22.27 kg/m  Physical Exam Vitals and nursing note reviewed.  HENT:     Mouth/Throat:     Comments: Angioedema of the tongue.  No clear posterior swelling seen.  No stridor. Pulmonary:     Breath sounds: No wheezing.  Musculoskeletal:        General: No tenderness.     Cervical back: Neck supple.  Skin:    Comments: Hives have improved  Neurological:     Mental Status: He is alert  and oriented to person, place, and time. Mental status is at baseline.     ED Results / Procedures / Treatments   Labs (all labs ordered are listed, but only abnormal results are displayed) Labs Reviewed - No data to display  EKG None  Radiology No results found.  Procedures Procedures    Medications Ordered in ED Medications  methylPREDNISolone sodium succinate (SOLU-MEDROL) 125 mg/2 mL injection 125 mg (125 mg Intravenous Given 07/24/23 1640)  diphenhydrAMINE (BENADRYL) injection 25 mg (25 mg Intravenous Given 07/24/23 1642)  famotidine (PEPCID) IVPB 20 mg premix (0 mg Intravenous Stopped 07/24/23 1810)  EPINEPHrine (EPI-PEN) injection 0.3 mg (0.3 mg Intramuscular Given 07/24/23 1645)    ED Course/ Medical Decision Making/ A&P                                 Medical Decision Making Risk Prescription drug management.   Patient with allergic reaction to red meat.  Tongue has angioedema.  Posterior pharynx appears stable at this time.  Has been given epi here.  Will continue to monitor.  Headaches reevaluated a couple times.  Now at just a little over 3 hours of admission time patient feels as if the tongue is improving.  Patient continues to be improving.  Appears stable for discharge home.  Will refill EpiPen.  Return for worsening symptoms.  During stay patient began to complain of a laceration of right index finger.  States it was from a couple weeks ago when he cut it on a chainsaw.  No other injury.  Does have some pain with it and states it bled a lot.  On physical exam there is a flap that is still attached.  Able to flex and extend at the PIP joint.  The wound is on the dorsum of the joint.  Joint appears closed and has good flexion extension at the fingers.  Do not see bone in the laceration.  Do not think we need imaging at this time.  Wound care and follow-up with PCP.  After 2 weeks I do not think he is a candidate for closing the wound.         Final  Clinical Impression(s) / ED Diagnoses Final diagnoses:  Allergic reaction, initial encounter  Laceration of right index finger without foreign body without damage to nail, initial encounter    Rx / DC Orders ED Discharge Orders          Ordered    EPINEPHrine (EPIPEN 2-PAK) 0.3 mg/0.3 mL IJ SOAJ injection  Once PRN  07/24/23 9629              Benjiman Core, MD 07/24/23 2059

## 2023-07-24 NOTE — ED Provider Triage Note (Signed)
Emergency Medicine Provider Triage Evaluation Note  KACEE KOREN , a 53 y.o. male  was evaluated in triage.  Pt complains of allergic reaction to beef ingested about 30 minutes prior to arrival. Pt reports swelling of tongue but no trouble breathing or feelings of throat closure. Reports hives on his back.   He has history of alpha gal. Took epi-pen that was in his car but states it was over a year old. Also took an unknown amount of Childrens benadryl about 30 min prior to arrival  Review of Systems  Positive: As above Negative: As above  Physical Exam  There were no vitals taken for this visit. Gen:   Awake, no distress   Resp:  Normal effort  MSK:   Moves extremities without difficulty  Other:  Tongue and lip edema  No stridor, talking without difficulty. No nausea or vomiting. No dizziness  Medical Decision Making  Medically screening exam initiated at 4:38 PM.  Appropriate orders placed.  TYMOTHY CASS was informed that the remainder of the evaluation will be completed by another provider, this initial triage assessment does not replace that evaluation, and the importance of remaining in the ED until their evaluation is complete.   IV started. Patient given solumedrol, benadryl, pepcid, and epi-pen   Arabella Merles, [place] 07/24/23 1641

## 2023-08-02 ENCOUNTER — Encounter: Payer: Self-pay | Admitting: Pharmacist

## 2023-08-02 ENCOUNTER — Other Ambulatory Visit: Payer: Self-pay

## 2023-08-09 ENCOUNTER — Ambulatory Visit: Payer: BLUE CROSS/BLUE SHIELD | Admitting: Family Medicine

## 2023-08-23 ENCOUNTER — Ambulatory Visit: Admitting: Family Medicine

## 2023-09-11 ENCOUNTER — Emergency Department (HOSPITAL_COMMUNITY)
Admission: EM | Admit: 2023-09-11 | Discharge: 2023-09-11 | Disposition: A | Discharge status: Home / Self Care | Payer: MEDICAID | Attending: Emergency Medicine | Admitting: Emergency Medicine

## 2023-09-11 ENCOUNTER — Other Ambulatory Visit: Payer: Self-pay

## 2023-09-11 ENCOUNTER — Encounter (HOSPITAL_COMMUNITY): Payer: Self-pay

## 2023-09-11 DIAGNOSIS — M79604 Pain in right leg: Secondary | ICD-10-CM

## 2023-09-11 DIAGNOSIS — L03115 Cellulitis of right lower limb: Secondary | ICD-10-CM | POA: Diagnosis not present

## 2023-09-11 DIAGNOSIS — E119 Type 2 diabetes mellitus without complications: Secondary | ICD-10-CM | POA: Insufficient documentation

## 2023-09-11 DIAGNOSIS — Z7901 Long term (current) use of anticoagulants: Secondary | ICD-10-CM | POA: Diagnosis not present

## 2023-09-11 DIAGNOSIS — M79661 Pain in right lower leg: Secondary | ICD-10-CM | POA: Diagnosis present

## 2023-09-11 MED ORDER — CEPHALEXIN 500 MG PO CAPS
500.0000 mg | ORAL_CAPSULE | Freq: Four times a day (QID) | ORAL | 0 refills | Status: DC
  Filled 2023-09-11: qty 20, 5d supply, fill #0

## 2023-09-11 MED ORDER — CEPHALEXIN 500 MG PO CAPS
500.0000 mg | ORAL_CAPSULE | Freq: Once | ORAL | Status: AC
  Administered 2023-09-11: 500 mg via ORAL
  Filled 2023-09-11: qty 1

## 2023-09-11 NOTE — ED Provider Notes (Signed)
 [place] EMERGENCY DEPARTMENT AT [facility] Provider Note   CSN: [id number] Arrival date & time: 09/11/23  2040     History  Chief Complaint  Patient presents with   Leg Pain    Timothy [name] is a 53 y.o. male history of PE and DVT on Xarelto presents with complaints of right lower extremity pain, redness and warmth have developed over the past few days.  Patient denies any injury or trauma.  He states he has a known DVT in his lower extremity and has been compliant with his Xarelto.  He is able to weight-bear with some discomfort.  Endorses chronic numbness secondary to diabetes.   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 Authorizing Provider  amLODipine (NORVASC) 5 MG tablet Take 1 tablet (5 mg total) by mouth daily. 04/05/23   Park Meo, FNP  ARIPiprazole (ABILIFY) 5 MG tablet Take 1 tablet (5 mg total) by mouth daily. 04/05/23   Park Meo, FNP  atorvastatin (LIPITOR) 40 MG tablet Take 1 tablet (40 mg total) by mouth daily. 05/08/23   Park Meo, FNP  dapagliflozin propanediol (FARXIGA) 10 MG TABS tablet Take 1 tablet (10 mg total) by mouth daily before breakfast. 06/21/22   Hoy Register, MD  DULoxetine (CYMBALTA) 60 MG capsule Take 1 capsule (60 mg total) by mouth daily. 04/24/23   Park Meo, FNP  EPINEPHrine (EPIPEN 2-PAK) 0.3 mg/0.3 mL IJ SOAJ injection Inject 0.3 mg into the muscle once as needed for up to 1 dose (for severe allergic  reaction). CAll 911 immediately if you have to use this medicine 07/24/23   Benjiman Core, MD  gabapentin (NEURONTIN) 300 MG capsule Take 2 capsules (600 mg total) by mouth 3 (three) times daily. 03/22/22   Hoy Register, MD  glucose blood (TRUE METRIX BLOOD GLUCOSE TEST) test strip Use 3 times daily 01/11/23   Park Meo, FNP  metFORMIN (GLUCOPHAGE) 1000 MG tablet Take 1 tablet (1,000 mg total) by mouth 2 (two) times daily with a meal. 05/08/23   Park Meo, FNP  methadone (DOLOPHINE) 10 MG/ML solution Take 70 mg by mouth daily.    [provider]  omeprazole (PRILOSEC) 40 MG capsule Take 1 capsule (40 mg total) by mouth daily. 03/27/23   Park Meo, FNP  rivaroxaban (XARELTO) 20 MG TABS tablet Take 1 tablet (20 mg total) by mouth daily with supper. 12/06/22   Park Meo, FNP  Semaglutide, 1 MG/DOSE, 4 MG/3ML SOPN Inject 1 mg as directed once a week. 11/09/22   Park Meo, FNP  TRUEplus Lancets 28G MISC use to test blood sugar 3 (three) times daily before meals. 10/19/21   Hoy Register, MD      Allergies    Beef-derived drug products, Food, Ioxaglate, Ivp dye [iodinated contrast media], Pork-derived products, and Shellfish allergy    Review of Systems   Review of  Systems  Musculoskeletal:  Positive for myalgias.    Physical Exam Updated Vital Signs BP (!) 159/115 (BP Location: Left Arm)   Pulse 98   Temp 98.6 F (37 C) (Oral)   Resp 18   Ht 6\' 5"  (1.956 m)   Wt 93 kg   SpO2 97%   BMI 24.31 kg/m  Physical Exam Vitals and nursing note reviewed.  Constitutional:      General: He is not in acute distress.    Appearance: He is well-developed.  HENT:     Head: Normocephalic and atraumatic.  Eyes:     Conjunctiva/sclera: Conjunctivae normal.  Cardiovascular:     Rate and Rhythm: Normal rate and regular rhythm.     Heart sounds: No murmur heard. Pulmonary:     Effort: Pulmonary effort is normal. No respiratory distress.     Breath sounds: Normal  breath sounds.  Abdominal:     Palpations: Abdomen is soft.     Tenderness: There is no abdominal tenderness.  Musculoskeletal:        General: Swelling present.     Cervical back: Neck supple.     Comments: Right lower extremity with 1+ pitting edema, posterior calf tenderness, generalized erythema and warmth, DP pulses symmetric, cap refill less than 2 secs  Skin:    General: Skin is warm and dry.     Capillary Refill: Capillary refill takes less than 2 seconds.  Neurological:     Mental Status: He is alert.  Psychiatric:        Mood and Affect: Mood normal.     ED Results / Procedures / Treatments   Labs (all labs ordered are listed, but only abnormal results are displayed) Labs Reviewed - No data to display  EKG None  Radiology No results found.  Procedures Procedures    Medications Ordered in ED Medications - No data to display  ED Course/ Medical Decision Making/ A&P                                 Medical Decision Making  This patient presents to the ED with chief complaint(s) of leg pain.  The complaint involves an extensive differential diagnosis and also carries with it a high risk of complications and morbidity.   pertinent past medical history as listed in HPI  The differential diagnosis includes  Septic joint, gout, DVT, cellulitis or fracture, musculoskeletal  Additional history obtained: Records reviewed Care Everywhere/External Records  Initial Assessment:   Patient presents hypertensive 159/115 with complaints of right lower extremity pain, redness and warmth over the past few days.  On exam he does have some calf erythema, warmth and tenderness.  Reports he has been compliant with his Xarelto.  Denies any chest pain or shortness of breath.  Do not have ultrasound available this evening.  Will treat as cellulitis and have patient return to the morning for repeat ultrasound.  Independent ECG interpretation:  none  Independent labs  interpretation:  The following labs were independently interpreted:  none  Independent visualization and interpretation of imaging: none  Treatment and Reassessment: Patient given first dose of Keflex  Consultations obtained:   none  Disposition:   Patient will be discharged home on prescription for Keflex.  Will return tomorrow for formal ultrasound to evaluate right lower extremity DVT. The patient has been appropriately medically screened and/or stabilized in the ED. I have low suspicion for any other emergent  medical condition which would require further screening, evaluation or treatment in the ED or require inpatient management. At time of discharge the patient is hemodynamically stable and in no acute distress. I have discussed work-up results and diagnosis with patient and answered all questions. Patient is agreeable with discharge plan. We discussed strict return precautions for returning to the emergency department and they verbalized understanding.     Social Determinants of Health:   none  This note was dictated with voice recognition software.  Despite best efforts at proofreading, errors may have occurred which can change the documentation meaning.          Final Clinical Impression(s) / ED Diagnoses Final diagnoses:  Right leg pain  Cellulitis of right lower extremity    Rx / DC Orders ED Discharge Orders     None         Fabienne Bruns 09/11/23 2325    Benjiman Core, MD 09/11/23 [phone]

## 2023-09-11 NOTE — ED Triage Notes (Addendum)
 Pt presents with a knot in his RLE with pain and itching that started 4 days ago. Pt was dx with a DVT in the RLE x 1 year ago so he is concerned he still has the DVT. Pt does report some increased swelling to leg. Denies CP or ShOB. Pt is taking Xerelto as prescribed. Pt is in NAD during triage.

## 2023-09-11 NOTE — Discharge Instructions (Addendum)
 You were evaluated in the emergency room for leg pain.  This is consistent with cellulitis.  Prescription for antibiotics was sent into your pharmacy.  Additionally please return tomorrow for an ultrasound.

## 2023-09-11 NOTE — ED Notes (Signed)
 Patient requested pill to be mixed with water, unable to swallow pills, he states

## 2023-09-12 ENCOUNTER — Other Ambulatory Visit: Payer: Self-pay

## 2023-09-12 ENCOUNTER — Ambulatory Visit (HOSPITAL_COMMUNITY): Admission: RE | Admit: 2023-09-12 | Payer: MEDICAID | Source: Ambulatory Visit

## 2023-09-15 ENCOUNTER — Other Ambulatory Visit: Payer: Self-pay

## 2023-09-15 ENCOUNTER — Other Ambulatory Visit (HOSPITAL_COMMUNITY): Payer: Self-pay

## 2023-09-18 ENCOUNTER — Emergency Department (HOSPITAL_COMMUNITY)
Admission: EM | Admit: 2023-09-18 | Discharge: 2023-09-19 | Discharge status: Against Medical Advice | Payer: MEDICAID | Attending: Emergency Medicine | Admitting: Emergency Medicine

## 2023-09-18 DIAGNOSIS — Z794 Long term (current) use of insulin: Secondary | ICD-10-CM | POA: Diagnosis not present

## 2023-09-18 DIAGNOSIS — Z5321 Procedure and treatment not carried out due to patient leaving prior to being seen by health care provider: Secondary | ICD-10-CM | POA: Insufficient documentation

## 2023-09-18 DIAGNOSIS — E119 Type 2 diabetes mellitus without complications: Secondary | ICD-10-CM | POA: Diagnosis not present

## 2023-09-18 DIAGNOSIS — M7989 Other specified soft tissue disorders: Secondary | ICD-10-CM | POA: Diagnosis present

## 2023-09-18 DIAGNOSIS — L03115 Cellulitis of right lower limb: Secondary | ICD-10-CM | POA: Diagnosis not present

## 2023-09-18 DIAGNOSIS — I82411 Acute embolism and thrombosis of right femoral vein: Secondary | ICD-10-CM | POA: Diagnosis not present

## 2023-09-18 DIAGNOSIS — L03116 Cellulitis of left lower limb: Secondary | ICD-10-CM | POA: Diagnosis not present

## 2023-09-18 DIAGNOSIS — I10 Essential (primary) hypertension: Secondary | ICD-10-CM | POA: Diagnosis not present

## 2023-09-18 DIAGNOSIS — Z79899 Other long term (current) drug therapy: Secondary | ICD-10-CM | POA: Diagnosis not present

## 2023-09-18 DIAGNOSIS — Z7984 Long term (current) use of oral hypoglycemic drugs: Secondary | ICD-10-CM | POA: Diagnosis not present

## 2023-09-18 DIAGNOSIS — Z7901 Long term (current) use of anticoagulants: Secondary | ICD-10-CM | POA: Diagnosis not present

## 2023-09-18 NOTE — ED Triage Notes (Signed)
 POV, right leg swelling. 8 out of 10. Pt was seen a week ago for the same reason without improvement. Tender to touch pulse present, capillary refill delayed. Bilateral redness. Aox4

## 2023-09-18 NOTE — ED Provider Triage Note (Signed)
 Emergency Medicine Provider Triage Evaluation Note  Timothy [name] , a 53 y.o. male  was evaluated in triage.  Pt complains of leg pain.  Review of Systems  Positive: Recent dx DVT RT, now worse pain and swelling Negative: Fever, vomiting, injury  Physical Exam  BP (!) 175/82 (BP Location: Right Arm)   Pulse (!) 104   Temp 99.8 F (37.7 C) (Oral)   Resp 16   SpO2 99%  Gen:   Awake, no distress   Resp:  Normal effort  MSK:   Moves extremities without difficulty R>L lower leg swelling and redness Other:    Medical Decision Making  Medically screening exam initiated at 11:41 PM.  Appropriate orders placed.  Timothy [name] was informed that the remainder of the evaluation will be completed by another provider, this initial triage assessment does not replace that evaluation, and the importance of remaining in the ED until their evaluation is complete.  DVT right leg, compliant with Xarelto. On Keflex for cellulitis as well. Symptoms of pain, redness and swelling worsening, now redness to left leg. No fever.    Mandy Second, PA-C 09/18/23 2343

## 2023-09-19 ENCOUNTER — Encounter (HOSPITAL_BASED_OUTPATIENT_CLINIC_OR_DEPARTMENT_OTHER): Payer: Self-pay

## 2023-09-19 ENCOUNTER — Emergency Department (HOSPITAL_BASED_OUTPATIENT_CLINIC_OR_DEPARTMENT_OTHER)
Admission: EM | Admit: 2023-09-19 | Discharge: 2023-09-19 | Disposition: A | Discharge status: Home / Self Care | Payer: MEDICAID | Source: Home / Self Care | Attending: Emergency Medicine | Admitting: Emergency Medicine

## 2023-09-19 ENCOUNTER — Emergency Department (HOSPITAL_BASED_OUTPATIENT_CLINIC_OR_DEPARTMENT_OTHER): Payer: MEDICAID

## 2023-09-19 ENCOUNTER — Other Ambulatory Visit: Payer: Self-pay

## 2023-09-19 ENCOUNTER — Other Ambulatory Visit (HOSPITAL_BASED_OUTPATIENT_CLINIC_OR_DEPARTMENT_OTHER): Payer: Self-pay

## 2023-09-19 DIAGNOSIS — L03115 Cellulitis of right lower limb: Secondary | ICD-10-CM | POA: Insufficient documentation

## 2023-09-19 DIAGNOSIS — I10 Essential (primary) hypertension: Secondary | ICD-10-CM | POA: Insufficient documentation

## 2023-09-19 DIAGNOSIS — E119 Type 2 diabetes mellitus without complications: Secondary | ICD-10-CM | POA: Insufficient documentation

## 2023-09-19 DIAGNOSIS — Z794 Long term (current) use of insulin: Secondary | ICD-10-CM | POA: Insufficient documentation

## 2023-09-19 DIAGNOSIS — Z7901 Long term (current) use of anticoagulants: Secondary | ICD-10-CM | POA: Insufficient documentation

## 2023-09-19 DIAGNOSIS — L03116 Cellulitis of left lower limb: Secondary | ICD-10-CM

## 2023-09-19 DIAGNOSIS — Z79899 Other long term (current) drug therapy: Secondary | ICD-10-CM | POA: Insufficient documentation

## 2023-09-19 DIAGNOSIS — I82411 Acute embolism and thrombosis of right femoral vein: Secondary | ICD-10-CM

## 2023-09-19 DIAGNOSIS — Z7984 Long term (current) use of oral hypoglycemic drugs: Secondary | ICD-10-CM | POA: Insufficient documentation

## 2023-09-19 LAB — COMPREHENSIVE METABOLIC PANEL WITH GFR
ALT: 12 U/L (ref 0–44)
ALT: 15 U/L (ref 0–44)
AST: 12 U/L — ABNORMAL LOW (ref 15–41)
AST: 15 U/L (ref 15–41)
Albumin: 3.6 g/dL (ref 3.5–5.0)
Albumin: 4.1 g/dL (ref 3.5–5.0)
Alkaline Phosphatase: 70 U/L (ref 38–126)
Alkaline Phosphatase: 74 U/L (ref 38–126)
Anion gap: 8 (ref 5–15)
Anion gap: 9 (ref 5–15)
BUN: 11 mg/dL (ref 6–20)
BUN: 9 mg/dL (ref 6–20)
CO2: 26 mmol/L (ref 22–32)
CO2: 28 mmol/L (ref 22–32)
Calcium: 8.9 mg/dL (ref 8.9–10.3)
Calcium: 9.7 mg/dL (ref 8.9–10.3)
Chloride: 104 mmol/L (ref 98–111)
Chloride: 106 mmol/L (ref 98–111)
Creatinine, Ser: 0.62 mg/dL (ref 0.61–1.24)
Creatinine, Ser: 0.68 mg/dL (ref 0.61–1.24)
GFR, Estimated: >60 mL/min (ref >60)
GFR, Estimated: >60 mL/min (ref >60)
Glucose, Bld: 138 mg/dL — ABNORMAL HIGH (ref 70–99)
Glucose, Bld: 165 mg/dL — ABNORMAL HIGH (ref 70–99)
Potassium: 3.4 mmol/L — ABNORMAL LOW (ref 3.5–5.1)
Potassium: 3.8 mmol/L (ref 3.5–5.1)
Sodium: 140 mmol/L (ref 135–145)
Sodium: 141 mmol/L (ref 135–145)
Total Bilirubin: 0.3 mg/dL (ref 0.0–1.2)
Total Bilirubin: 0.5 mg/dL (ref 0.0–1.2)
Total Protein: 6.8 g/dL (ref 6.5–8.1)
Total Protein: 7.3 g/dL (ref 6.5–8.1)

## 2023-09-19 LAB — CBC WITH DIFFERENTIAL/PLATELET
Abs Immature Granulocytes: 0.01 10*3/uL (ref 0.00–0.07)
Abs Immature Granulocytes: 0.02 10*3/uL (ref 0.00–0.07)
Basophils Absolute: 0 10*3/uL (ref 0.0–0.1)
Basophils Absolute: 0 10*3/uL (ref 0.0–0.1)
Basophils Relative: 0 %
Basophils Relative: 1 %
Eosinophils Absolute: 0.3 10*3/uL (ref 0.0–0.5)
Eosinophils Absolute: 0.3 10*3/uL (ref 0.0–0.5)
Eosinophils Relative: 4 %
Eosinophils Relative: 5 %
HCT: 40.9 % (ref 39.0–52.0)
HCT: 41.5 % (ref 39.0–52.0)
Hemoglobin: 13 g/dL (ref 13.0–17.0)
Hemoglobin: 13.2 g/dL (ref 13.0–17.0)
Immature Granulocytes: 0 %
Immature Granulocytes: 0 %
Lymphocytes Relative: 25 %
Lymphocytes Relative: 27 %
Lymphs Abs: 1.6 10*3/uL (ref 0.7–4.0)
Lymphs Abs: 1.8 10*3/uL (ref 0.7–4.0)
MCH: 29.3 pg (ref 26.0–34.0)
MCH: 30 pg (ref 26.0–34.0)
MCHC: 31.8 g/dL (ref 30.0–36.0)
MCHC: 31.8 g/dL (ref 30.0–36.0)
MCV: 92 fL (ref 80.0–100.0)
MCV: 94.5 fL (ref 80.0–100.0)
Monocytes Absolute: 0.3 10*3/uL (ref 0.1–1.0)
Monocytes Absolute: 0.3 10*3/uL (ref 0.1–1.0)
Monocytes Relative: 5 %
Monocytes Relative: 5 %
Neutro Abs: 3.7 10*3/uL (ref 1.7–7.7)
Neutro Abs: 4.7 10*3/uL (ref 1.7–7.7)
Neutrophils Relative %: 63 %
Neutrophils Relative %: 65 %
Platelets: 192 10*3/uL (ref 150–400)
Platelets: 194 10*3/uL (ref 150–400)
RBC: 4.33 MIL/uL (ref 4.22–5.81)
RBC: 4.51 MIL/uL (ref 4.22–5.81)
RDW: 14 % (ref 11.5–15.5)
RDW: 14 % (ref 11.5–15.5)
WBC: 6 10*3/uL (ref 4.0–10.5)
WBC: 7.1 10*3/uL (ref 4.0–10.5)
nRBC: 0 % (ref 0.0–0.2)
nRBC: 0 % (ref 0.0–0.2)

## 2023-09-19 LAB — URINALYSIS, W/ REFLEX TO CULTURE (INFECTION SUSPECTED)
Bacteria, UA: NONE SEEN
Bilirubin Urine: NEGATIVE
Glucose, UA: NEGATIVE mg/dL
Hgb urine dipstick: NEGATIVE
Ketones, ur: NEGATIVE mg/dL
Leukocytes,Ua: NEGATIVE
Nitrite: NEGATIVE
Protein, ur: 30 mg/dL — AB
Specific Gravity, Urine: 1.029 (ref 1.005–1.030)
pH: 5 (ref 5.0–8.0)

## 2023-09-19 LAB — I-STAT CG4 LACTIC ACID, ED: Lactic Acid, Venous: 0.8 mmol/L (ref 0.5–1.9)

## 2023-09-19 LAB — LACTIC ACID, PLASMA: Lactic Acid, Venous: 0.8 mmol/L (ref 0.5–1.9)

## 2023-09-19 MED ORDER — RIVAROXABAN 20 MG PO TABS: 20.0000 mg | ORAL_TABLET | Freq: Once | ORAL | Status: DC

## 2023-09-19 MED ORDER — DOXYCYCLINE HYCLATE 100 MG PO CAPS
100.0000 mg | ORAL_CAPSULE | Freq: Two times a day (BID) | ORAL | 0 refills | Status: DC
  Filled 2023-09-19: qty 14, 7d supply, fill #0

## 2023-09-19 MED ORDER — RIVAROXABAN (XARELTO) VTE STARTER PACK (15 & 20 MG)
ORAL_TABLET | ORAL | 0 refills | Status: AC
  Filled 2023-09-19: qty 51, 28d supply, fill #0

## 2023-09-19 MED ORDER — RIVAROXABAN 15 MG PO TABS
15.0000 mg | ORAL_TABLET | Freq: Once | ORAL | Status: AC
Start: 2023-09-19 — End: 2023-09-19
  Administered 2023-09-19: 15 mg via ORAL
  Filled 2023-09-19: qty 1

## 2023-09-19 MED ORDER — AMOXICILLIN-POT CLAVULANATE 875-125 MG PO TABS
1.0000 | ORAL_TABLET | Freq: Two times a day (BID) | ORAL | 0 refills | Status: DC
  Filled 2023-09-19: qty 14, 7d supply, fill #0

## 2023-09-19 MED ORDER — SODIUM CHLORIDE 0.9 % IV SOLN
1.0000 g | Freq: Once | INTRAVENOUS | Status: AC
  Administered 2023-09-19: 1 g via INTRAVENOUS
  Filled 2023-09-19: qty 10

## 2023-09-19 NOTE — ED Notes (Signed)
 Patient transported to Ultrasound

## 2023-09-19 NOTE — Discharge Instructions (Signed)
 Please read and follow all provided instructions.  Your diagnoses today include:  1. Cellulitis of right leg   2. Cellulitis of left leg   3. Acute deep vein thrombosis (DVT) of femoral vein of right lower extremity (HCC)     Tests performed today include: Vital signs. See below for your results today.  Complete blood cell count: Normal blood cell count and hemoglobin Basic metabolic panel: Normal liver and kidney function Lactic acid: No sign of sepsis today  Medications prescribed:  Augmentin - antibiotic  You have been prescribed an antibiotic medicine: take the entire course of medicine even if you are feeling better. Stopping early can cause the antibiotic not to work.  Doxycycline - antibiotic  You have been prescribed an antibiotic medicine: take the entire course of medicine even if you are feeling better. Stopping early can cause the antibiotic not to work.  Xarelto - medication for blood thinning and clot  Take any prescribed medications only as directed.   Home care instructions:  Follow any educational materials contained in this packet. Keep affected area above the level of your heart when possible. Wash area gently twice a day with warm soapy water. Do not apply alcohol or hydrogen peroxide. Cover the area if it draining or weeping.   Follow-up instructions: See your doctor or return to the Emergency Department in 48 hours for a recheck if your symptoms are not significantly improved.   Return instructions:  Return to the Emergency Department if you have: Fever Worsening symptoms Worsening pain Worsening swelling Redness of the skin that moves away from the affected area, especially if it streaks away from the affected area  Any other emergent concerns  Your vital signs today were: BP (!) 148/79 (BP Location: Left Arm)   Pulse 78   Temp 98.7 F (37.1 C) (Oral)   Resp 18   Ht 6\' 5"  (1.956 m)   Wt 93 kg   SpO2 100%   BMI 24.31 kg/m  If your blood  pressure (BP) was elevated above 135/85 this visit, please have this repeated by your doctor within one month. --------------

## 2023-09-19 NOTE — ED Triage Notes (Signed)
 In for eval of swelling, itching, and wound to right leg onset 4 days ago.

## 2023-09-19 NOTE — ED Provider Notes (Signed)
 [place] EMERGENCY DEPARTMENT AT [facility] Provider Note   CSN: [id number] Arrival date & time: 09/19/23  1005     History  Chief Complaint  Patient presents with   Wound Check   Leg Pain    Timothy [name] is a 53 y.o. male.  Patient past medical history of mosaic Klinefelter syndrome, diabetes, deep vein thrombosis complicated by pulmonary embolism, hypertension, generalized anxiety disorder, major depressive disorder, and gastroesophageal reflux disease --presents to the urgency department for evaluation of bilateral lower extremity redness, swelling, pain.  Patient states that he developed a rash on his right lower extremity 1-2 weeks ago.  He was seen at [facility] on 4/7, started on Keflex.  Patient states that he has taken this antibiotic without improvement.  In fact, the area has been worsening and now extends onto the left lower extremity as well.  He has had some blisters formed on the right lower extremity that have broken open and he has 1 area that is an open wound.  He denies fevers or chills.  No nausea or vomiting.  He reports being out of his Xarelto for several days.  He does not check his blood sugars routinely. He has been applying caladryl lotion.        Home Medications Prior to Admission medications   Medication Sig Start Date End Date Taking? Authorizing Provider  amoxicillin-clavulanate (AUGMENTIN) 875-125 MG tablet Take 1 tablet by mouth every 12 (twelve) hours. 09/19/23  Yes Renne Crigler, PA-C  doxycycline (VIBRAMYCIN) 100 MG capsule Take 1 capsule (100 mg total) by mouth 2 (two) times daily. 09/19/23  Yes Renne Crigler, PA-C  RIVAROXABAN Carlena Hurl) VTE STARTER PACK (15 & 20 MG) Follow package directions: Take one 15mg  tablet by mouth twice a day. On day 22, switch to one 20mg  tablet once a day. Take with food. 09/19/23  Yes Renne Crigler, PA-C  amLODipine (NORVASC) 5 MG tablet Take 1 tablet (5 mg total) by mouth daily. 04/05/23   Park Meo,  FNP  ARIPiprazole (ABILIFY) 5 MG tablet Take 1 tablet (5 mg total) by mouth daily. 04/05/23   Park Meo, FNP  atorvastatin (LIPITOR) 40 MG tablet Take 1 tablet (40 mg total) by mouth daily. 05/08/23   Park Meo, FNP  dapagliflozin propanediol (FARXIGA) 10 MG TABS tablet Take 1 tablet (10 mg total) by mouth daily before breakfast. 06/21/22   Hoy Register, MD  DULoxetine (CYMBALTA) 60 MG capsule Take 1 capsule (60 mg total) by mouth daily. 04/24/23   Park Meo, FNP  EPINEPHrine (EPIPEN 2-PAK) 0.3 mg/0.3 mL IJ SOAJ injection Inject 0.3 mg into the muscle once as needed for up to 1 dose (for severe allergic reaction). CAll 911 immediately if you have to use this medicine 07/24/23   Benjiman Core, MD  gabapentin (NEURONTIN) 300 MG capsule Take 2 capsules (600 mg total) by mouth 3 (three) times daily. 03/22/22   Hoy Register, MD  glucose blood (TRUE METRIX BLOOD GLUCOSE TEST) test strip Use 3 times daily 01/11/23   Park Meo, FNP  metFORMIN (GLUCOPHAGE) 1000 MG tablet Take 1 tablet (1,000 mg total) by mouth 2 (two) times daily with a meal. 05/08/23   Park Meo, FNP  methadone (DOLOPHINE) 10 MG/ML solution Take 70 mg by mouth daily.    [provider]  omeprazole (PRILOSEC) 40 MG capsule Take 1 capsule (40 mg total) by mouth daily. 03/27/23   Park Meo, FNP  Semaglutide, 1 MG/DOSE, 4  MG/3ML SOPN Inject 1 mg as directed once a week. 11/09/22   Park Meo, FNP  TRUEplus Lancets 28G MISC use to test blood sugar 3 (three) times daily before meals. 10/19/21   Hoy Register, MD      Allergies    Beef-derived drug products, Food, Ioxaglate, Ivp dye [iodinated contrast media], Pork-derived products, and Shellfish allergy    Review of Systems   Review of Systems  Physical Exam Updated Vital Signs BP (!) 168/92 (BP Location: Right Arm)   Pulse 92   Temp 98.5 F (36.9 C)   Resp 18   Ht 6\' 5"  (1.956 m)   Wt 93 kg   SpO2 96%   BMI 24.31 kg/m  Physical  Exam Vitals and nursing note reviewed.  Constitutional:      General: He is not in acute distress.    Appearance: He is well-developed.  HENT:     Head: Normocephalic and atraumatic.  Eyes:     General:        Right eye: No discharge.        Left eye: No discharge.     Conjunctiva/sclera: Conjunctivae normal.  Cardiovascular:     Rate and Rhythm: Normal rate and regular rhythm.     Heart sounds: Normal heart sounds.  Pulmonary:     Effort: Pulmonary effort is normal.     Breath sounds: Normal breath sounds.  Abdominal:     Palpations: Abdomen is soft.     Tenderness: There is no abdominal tenderness.  Musculoskeletal:     Cervical back: Normal range of motion and neck supple.  Skin:    General: Skin is warm and dry.     Findings: Rash present.     Comments: Patient with poorly demarcated areas of erythema, right lower extremity greater than left lower extremity, on background of more chronic appearing venous stasis changes..  Very warm to touch.  Mildly tender to palpation.  No lymphangitic spread.  No abscesses.  Patient does have a few small bullae on the right lower extremity.  He has a wound in the middle of the shin about the size of a dime that is open, which was a previous bullae which has ruptured.  Neurological:     Mental Status: He is alert.            ED Results / Procedures / Treatments   Labs (all labs ordered are listed, but only abnormal results are displayed) Labs Reviewed  COMPREHENSIVE METABOLIC PANEL WITH GFR - Abnormal; Notable for the following components:      Result Value   Glucose, Bld 138 (*)    AST 12 (*)    All other components within normal limits  CBC WITH DIFFERENTIAL/PLATELET  LACTIC ACID, PLASMA    EKG None  Radiology US Venous Img Lower Right (DVT Study) Result Date: 09/19/2023 CLINICAL DATA:  Right lower extremity pain and edema. EXAM: RIGHT LOWER EXTREMITY VENOUS DOPPLER ULTRASOUND TECHNIQUE: Gray-scale sonography with  graded compression, as well as color Doppler and duplex ultrasound were performed to evaluate the lower extremity deep venous systems from the level of the common femoral vein and including the common femoral, femoral, profunda femoral, popliteal and calf veins including the posterior tibial, peroneal and gastrocnemius veins when visible. The superficial great saphenous vein was also interrogated. Spectral Doppler was utilized to evaluate flow at rest and with distal augmentation maneuvers in the common femoral, femoral and popliteal veins. COMPARISON:  None Available. FINDINGS: Contralateral Common  Femoral Vein: Respiratory phasicity is normal and symmetric with the symptomatic side. No evidence of thrombus. Normal compressibility. Common Femoral Vein: No evidence of thrombus. Normal compressibility, respiratory phasicity and response to augmentation. Saphenofemoral Junction: No evidence of thrombus. Normal compressibility and flow on color Doppler imaging. Profunda Femoral Vein: No evidence of thrombus. Normal compressibility and flow on color Doppler imaging. Femoral Vein: Nonocclusive thrombus present in the mid to distal right femoral vein. Popliteal Vein: Nonocclusive thrombus present in the right popliteal vein. Calf Veins: No evidence of thrombus. Normal compressibility and flow on color Doppler imaging. Superficial Great Saphenous Vein: No evidence of thrombus. Normal compressibility. Venous Reflux:  None. Other Findings: No evidence of superficial thrombophlebitis or abnormal fluid collection. IMPRESSION: Nonocclusive thrombus in the mid to distal right femoral vein and right popliteal vein. Electronically Signed   By: Irish Lack M.D.   On: 09/19/2023 16:37    Procedures Procedures    Medications Ordered in ED Medications  Rivaroxaban (XARELTO) tablet 15 mg (has no administration in time range)  cefTRIAXone (ROCEPHIN) 1 g in sodium chloride 0.9 % 100 mL IVPB (0 g Intravenous Stopped 09/19/23  1500)    ED Course/ Medical Decision Making/ A&P    Patient seen and examined. History obtained directly from patient.   Labs/EKG: Ordered CBC, CMP, lactate.  Imaging: None ordered  Medications/Fluids: None ordered  Most recent vital signs reviewed and are as follows: BP (!) 168/92 (BP Location: Right Arm)   Pulse 92   Temp 98.5 F (36.9 C)   Resp 18   Ht 6\' 5"  (1.956 m)   Wt 93 kg   SpO2 96%   BMI 24.31 kg/m   Initial impression: Patient with right lower extremity cellulitis, milder left lower extremity cellulitis --erythema and warmth, appears to be more than just venous stasis changes.  Lab workup ordered.  Patient does not appear to be toxic at this time.  2:08 PM Reassessment performed. Patient appears stable, resting in the room.  Labs personally reviewed and interpreted including: CBC with normal white blood cell count and hemoglobin; CMP glucose mildly elevated at 138, normal creatinine otherwise unremarkable; lactate normal at 0.8.  Reviewed pertinent lab work and imaging with patient at bedside. Questions answered.   Most current vital signs reviewed and are as follows: BP (!) 144/75   Pulse 71   Temp 98.5 F (36.9 C)   Resp 18   Ht 6\' 5"  (1.956 m)   Wt 93 kg   SpO2 100%   BMI 24.31 kg/m   Plan: Will give a dose of rocephin here, plan for dc. Will give doxy/augmentin. Encouraged PCP f/u in 3 days.   Will check [place] to r/o DVT given medication noncompliance. Will give dose of Xarelto and give rx.   Pt urged to return with worsening pain, worsening swelling, expanding area of redness or streaking up extremity, fever, or any other concerns. Urged to take complete course of antibiotics as prescribed. Pt verbalizes understanding and agrees with plan.  5:06 PM DVT study with nonocculsive DVT. Discussed with pharmacy. It appears that patient has likely been off of Xarelto longer than suspected.  Pharmacy states that last filled 30-day supply in December.  Plan to  reload.   5:20 PM Reassessment performed. Patient appears stable. Discussed discharge plan.   Lost current vital signs reviewed and are as follows: BP (!) 148/79 (BP Location: Left Arm)   Pulse 78   Temp 98.7 F (37.1 C) (Oral)   Resp 18  Ht 6\' 5"  (1.956 m)   Wt 93 kg   SpO2 100%   BMI 24.31 kg/m   Plan: Discharge to home.   Prescriptions written for: Doxycycline, Augmentin, Xarelto starter pack  Other home care instructions discussed: Elevation of extremity  ED return instructions discussed: Pt urged to return with worsening pain, worsening swelling, expanding area of redness or streaking up extremity, fever, or any other concerns.  Urged to take complete course of antibiotics as prescribed. Pt verbalizes understanding and agrees with plan.  Follow-up instructions discussed: Patient encouraged to follow-up with their PCP in 2 days.                                 Medical Decision Making Amount and/or Complexity of Data Reviewed Labs: ordered.   Patient presents with ongoing redness and swelling in the lower legs.  Started on the right, moved to the left.  Clinically consistent with cellulitis.  DVT study performed today showed nonocclusive DVT.  Patient has been noncompliant with his anticoagulation recently.  Given clot, we will plan to reload.  No chest pain or shortness of breath to suggest PE today.  Labs look very good.  No fever.  No elevated white blood cell count.  Blood sugar relatively controlled.  Will broaden antibiotics to Augmentin/Doxy.  Patient was given a dose of Rocephin here.  Will give another trial of treatment at home.  The patient's vital signs, pertinent lab work and imaging were reviewed and interpreted as discussed in the ED course. Hospitalization was considered for further testing, treatments, or serial exams/observation. However as patient is well-appearing, has a stable exam, and reassuring studies today, I do not feel that they warrant admission at  this time. This plan was discussed with the patient who verbalizes agreement and comfort with this plan and seems reliable and able to return to the Emergency Department with worsening or changing symptoms.          Final Clinical Impression(s) / ED Diagnoses Final diagnoses:  Cellulitis of right leg  Cellulitis of left leg  Acute deep vein thrombosis (DVT) of femoral vein of right lower extremity (HCC)    Rx / DC Orders ED Discharge Orders          Ordered    RIVAROXABAN (XARELTO) VTE STARTER PACK (15 & 20 MG)        09/19/23 1709    doxycycline (VIBRAMYCIN) 100 MG capsule  2 times daily        09/19/23 1709    amoxicillin-clavulanate (AUGMENTIN) 875-125 MG tablet  Every 12 hours        09/19/23 1709              Lyna Sandhoff, PA-C 09/19/23 1722    Rosealee Concha, MD 09/20/23 [phone]

## 2023-09-19 NOTE — ED Notes (Signed)
Pt called x 3 , no answer

## 2023-09-20 ENCOUNTER — Other Ambulatory Visit: Payer: Self-pay

## 2023-10-23 ENCOUNTER — Ambulatory Visit (INDEPENDENT_AMBULATORY_CARE_PROVIDER_SITE_OTHER): Payer: MEDICAID | Admitting: Family Medicine

## 2023-10-23 ENCOUNTER — Encounter: Payer: Self-pay | Admitting: Family Medicine

## 2023-10-23 ENCOUNTER — Other Ambulatory Visit: Payer: Self-pay

## 2023-10-23 VITALS — BP 120/76 | HR 68 | Ht 77.0 in | Wt 257.2 lb

## 2023-10-23 DIAGNOSIS — I152 Hypertension secondary to endocrine disorders: Secondary | ICD-10-CM

## 2023-10-23 DIAGNOSIS — E1142 Type 2 diabetes mellitus with diabetic polyneuropathy: Secondary | ICD-10-CM | POA: Diagnosis not present

## 2023-10-23 DIAGNOSIS — E1159 Type 2 diabetes mellitus with other circulatory complications: Secondary | ICD-10-CM | POA: Diagnosis not present

## 2023-10-23 DIAGNOSIS — F3130 Bipolar disorder, current episode depressed, mild or moderate severity, unspecified: Secondary | ICD-10-CM | POA: Diagnosis not present

## 2023-10-23 DIAGNOSIS — E1165 Type 2 diabetes mellitus with hyperglycemia: Secondary | ICD-10-CM

## 2023-10-23 DIAGNOSIS — Z7984 Long term (current) use of oral hypoglycemic drugs: Secondary | ICD-10-CM

## 2023-10-23 MED ORDER — GABAPENTIN 300 MG PO CAPS
300.0000 mg | ORAL_CAPSULE | Freq: Two times a day (BID) | ORAL | 6 refills | Status: AC
  Filled 2023-10-23: qty 180, 90d supply, fill #0
  Filled 2024-03-23 – 2024-04-04 (×2): qty 180, 90d supply, fill #1

## 2023-10-23 MED ORDER — ARIPIPRAZOLE 5 MG PO TABS
5.0000 mg | ORAL_TABLET | Freq: Every day | ORAL | 1 refills | Status: AC
  Filled 2023-10-23: qty 30, 30d supply, fill #0
  Filled 2023-11-25 – 2024-03-23 (×2): qty 30, 30d supply, fill #1

## 2023-10-23 NOTE — Assessment & Plan Note (Signed)
 Refilled gabapentin

## 2023-10-23 NOTE — Assessment & Plan Note (Signed)
 A1c and uACR today. Foot exam UTD. Vaccines UTD. Retinal eye exam UTD. Recommend heart healthy diet such as Mediterranean diet with whole grains, fruits, vegetable, fish, lean meats, nuts, and olive oil. Limit salt. Encouraged moderate walking, 3-5 times/week for 30-50 minutes each session. Aim for at least 150 minutes.week. Goal should be pace of 3 miles/hours, or walking 1.5 miles in 30 minutes. Seek medical care for urinary frequency, extreme thirst, vision changes, lightheadedness, dizziness. Labs today, unmedicated. Follow up in 2-4 weeks.

## 2023-10-23 NOTE — Assessment & Plan Note (Signed)
 Chronic controlled without medication. Recommend heart healthy diet such as Mediterranean diet with whole grains, fruits, vegetable, fish, lean meats, nuts, and olive oil. Limit salt. Encouraged moderate walking, 3-5 times/week for 30-50 minutes each session. Aim for at least 150 minutes.week. Goal should be pace of 3 miles/hours, or walking 1.5 miles in 30 minutes. Avoid tobacco products. Avoid excess alcohol. Take medications as prescribed and bring medications and blood pressure log with cuff to each office visit. Seek medical care for chest pain, palpitations, shortness of breath with exertion, dizziness/lightheadedness, vision changes, recurrent headaches, or swelling of extremities. Labs today, follow up in 2-4 weeks.

## 2023-10-23 NOTE — Progress Notes (Signed)
 Subjective:  HPI: Timothy [name] is a 53 y.o. male presenting on 10/23/2023 for No chief complaint on file.   HPI Patient is in today for a delayed follow up for chronic conditions. Since I have seen him last he had ED visits for cellulitis of his right leg, DVT, and hyperglycemia. He reports his symptoms of lower extremity swelling and redness are resolved since hospitalization. Was told his diabetes was cured. A1c 5.3% while hospitalized. Reports he has lost 100 pounds in last several months, is 15 pounds lighter than his last OV with me 6 months ago.  Has been out of medications for 3 months. Is taking Xarelto  and Omeprazole . Continues to be seen at the methadone  clinic 2x monthly.  DIABETES Hypoglycemic episodes:no Polydipsia/polyuria: no Visual disturbance: no Chest pain: no Paresthesias: no Glucose Monitoring: yes  Accucheck frequency: every other day  Fasting glucose: <100  Post prandial:  Evening: 110-150  Before meals: Taking Insulin ?: no  Long acting insulin :  Short acting insulin : Blood Pressure Monitoring: daily Retinal Examination: Not up to Date Foot Exam: Up to Date Diabetic Education: Completed Pneumovax: Not up to Date Influenza: Up to Date Aspirin: no  HYPERTENSION without Chronic Kidney Disease Hypertension status: controlled  Satisfied with current treatment? yes Duration of hypertension: chronic BP monitoring frequency:  daily BP range: <140/90 BP medication side effects:  no Medication compliance: currently unmedicated Previous BP meds: amlodipine  Aspirin: no Recurrent headaches: no Visual changes: no Palpitations: no Dyspnea: no Chest pain: no Lower extremity edema: no Dizzy/lightheaded: no    Review of Systems  All other systems reviewed and are negative.   Relevant past medical history reviewed and updated as indicated.   Past Medical History:  Diagnosis Date   Carpal tunnel syndrome on both sides 03/28/2013   Chronic headache     Diabetes mellitus without complication (HCC)    DVT (deep venous thrombosis) (HCC)    2017-2018: 4 dvts   GERD (gastroesophageal reflux disease)    Helicobacter pylori gastritis 06/30/2006   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   Pneumonia    Pulmonary embolism (HCC)    2013: 2 total   TIA (transient ischemic attack)      Past Surgical History:  Procedure Laterality Date   KNEE SURGERY Right 10/2016    Allergies and medications reviewed and updated.   Current Outpatient Medications:    amLODipine  (NORVASC ) 5 MG tablet, Take 1 tablet (5 mg total) by mouth daily., Disp: 30 tablet, Rfl: 1   atorvastatin  (LIPITOR) 40 MG tablet, Take 1 tablet (40 mg total) by mouth daily., Disp: 90 tablet, Rfl: 0   DULoxetine  (CYMBALTA ) 60 MG capsule, Take 1 capsule (60 mg total) by mouth daily., Disp: 90 capsule, Rfl: 0   EPINEPHrine  (EPIPEN  2-PAK) 0.3 mg/0.3 mL IJ SOAJ injection, Inject 0.3 mg into the muscle once as needed for up to 1 dose (for severe allergic reaction). CAll 911 immediately if you have to use this medicine, Disp: 2 each, Rfl: 0   methadone  (DOLOPHINE ) 10 MG/ML solution, Take 70 mg by mouth daily., Disp: , Rfl:    omeprazole  (PRILOSEC) 40 MG capsule, Take 1 capsule (40 mg total) by mouth daily., Disp: 90 capsule, Rfl: 0   RIVAROXABAN  (XARELTO ) VTE STARTER PACK (15 & 20 MG), Follow package directions: Take one 15mg  tablet by mouth twice a day. On day 22, switch to one 20mg  tablet once a day. Take with food., Disp: 51 each, Rfl: 0   amoxicillin -clavulanate (AUGMENTIN ) 875-125 MG tablet,  Take 1 tablet by mouth every 12 (twelve) hours. (Patient not taking: Reported on 10/23/2023), Disp: 14 tablet, Rfl: 0   ARIPiprazole  (ABILIFY ) 5 MG tablet, Take 1 tablet (5 mg total) by mouth daily., Disp: 30 tablet, Rfl: 1   dapagliflozin  propanediol (FARXIGA ) 10 MG TABS tablet, Take 1 tablet (10 mg total) by mouth daily before breakfast. (Patient not taking: Reported on 10/23/2023), Disp: 30 tablet, Rfl:  6   doxycycline  (VIBRAMYCIN ) 100 MG capsule, Take 1 capsule (100 mg total) by mouth 2 (two) times daily. (Patient not taking: Reported on 10/23/2023), Disp: 14 capsule, Rfl: 0   gabapentin  (NEURONTIN ) 300 MG capsule, Take 1 capsule (300 mg total) by mouth 2 (two) times daily., Disp: 180 capsule, Rfl: 6   glucose blood (TRUE METRIX BLOOD GLUCOSE TEST) test strip, Use 3 times daily (Patient not taking: Reported on 10/23/2023), Disp: 100 each, Rfl: 12   metFORMIN  (GLUCOPHAGE ) 1000 MG tablet, Take 1 tablet (1,000 mg total) by mouth 2 (two) times daily with a meal. (Patient not taking: Reported on 10/23/2023), Disp: 180 tablet, Rfl: 0   Semaglutide , 1 MG/DOSE, 4 MG/3ML SOPN, Inject 1 mg as directed once a week. (Patient not taking: Reported on 10/23/2023), Disp: 3 mL, Rfl: 0   TRUEplus Lancets 28G MISC, use to test blood sugar 3 (three) times daily before meals. (Patient not taking: Reported on 10/23/2023), Disp: 100 each, Rfl: 12  Allergies  Allergen Reactions   Beef-Derived Drug Products Anaphylaxis    Alpha Gal.    Food Anaphylaxis    All mammalian meat - anaphylaxis (alpha gal)   Ioxaglate Shortness Of Breath and Other (See Comments)    Dizziness    Ivp Dye [Iodinated Contrast Media] Shortness Of Breath and Other (See Comments)    Dizziness    Pork-Derived Products Anaphylaxis    Alpha Gal.   Shellfish Allergy  Swelling    Swelling of face/mouth. No airway restriction.    Objective:   BP 120/76   Pulse 68   Ht 6\' 5"  (1.956 m)   Wt 257 lb 4 oz (116.7 kg)   SpO2 97%   BMI 30.51 kg/m      10/23/2023   11:43 AM 09/19/2023    5:22 PM 09/19/2023    2:11 PM  Vitals with BMI  Height 6\' 5"     Weight 257 lbs 4 oz    BMI 30.5    Systolic 120 130 409  Diastolic 76 68 79  Pulse 68 70 78     Physical Exam Vitals and nursing note reviewed.  Constitutional:      Appearance: Normal appearance. He is normal weight.  HENT:     Head: Normocephalic and atraumatic.  Cardiovascular:     Rate  and Rhythm: Normal rate and regular rhythm.     Pulses: Normal pulses.     Heart sounds: Normal heart sounds.  Pulmonary:     Effort: Pulmonary effort is normal.     Breath sounds: Normal breath sounds.  Musculoskeletal:     Right lower leg: No edema.     Left lower leg: No edema.  Skin:    General: Skin is warm and dry.     Capillary Refill: Capillary refill takes less than 2 seconds.  Neurological:     General: No focal deficit present.     Mental Status: He is alert and oriented to person, place, and time. Mental status is at baseline.  Psychiatric:        Mood and  Affect: Mood normal.        Behavior: Behavior normal.        Thought Content: Thought content normal.        Judgment: Judgment normal.     Assessment & Plan:  Hypertension associated with diabetes (HCC) Assessment & Plan: Chronic controlled without medication. Recommend heart healthy diet such as Mediterranean diet with whole grains, fruits, vegetable, fish, lean meats, nuts, and olive oil. Limit salt. Encouraged moderate walking, 3-5 times/week for 30-50 minutes each session. Aim for at least 150 minutes.week. Goal should be pace of 3 miles/hours, or walking 1.5 miles in 30 minutes. Avoid tobacco products. Avoid excess alcohol. Take medications as prescribed and bring medications and blood pressure log with cuff to each office visit. Seek medical care for chest pain, palpitations, shortness of breath with exertion, dizziness/lightheadedness, vision changes, recurrent headaches, or swelling of extremities. Labs today, follow up in 2-4 weeks.  Orders: -     CBC with Differential/Platelet -     Comprehensive metabolic panel with GFR -     Lipid panel -     Hemoglobin A1c -     Microalbumin / creatinine urine ratio  Type 2 diabetes mellitus with hyperglycemia, without long-term current use of insulin  (HCC) Assessment & Plan: A1c and uACR today. Foot exam UTD. Vaccines UTD. Retinal eye exam UTD. Recommend heart  healthy diet such as Mediterranean diet with whole grains, fruits, vegetable, fish, lean meats, nuts, and olive oil. Limit salt. Encouraged moderate walking, 3-5 times/week for 30-50 minutes each session. Aim for at least 150 minutes.week. Goal should be pace of 3 miles/hours, or walking 1.5 miles in 30 minutes. Seek medical care for urinary frequency, extreme thirst, vision changes, lightheadedness, dizziness. Labs today, unmedicated. Follow up in 2-4 weeks.   Orders: -     CBC with Differential/Platelet -     Comprehensive metabolic panel with GFR -     Lipid panel -     Hemoglobin A1c -     Microalbumin / creatinine urine ratio  Diabetic polyneuropathy associated with type 2 diabetes mellitus (HCC) Assessment & Plan: Refilled gabapentin   Orders: -     Gabapentin ; Take 1 capsule (300 mg total) by mouth 2 (two) times daily.  Dispense: 180 capsule; Refill: 6  Bipolar affective disorder, current episode depressed, current episode severity unspecified (HCC) Assessment & Plan: Restart Abilify  2mg  daily.    Other orders -     ARIPiprazole ; Take 1 tablet (5 mg total) by mouth daily.  Dispense: 30 tablet; Refill: 1     Follow up plan: Return in about 4 weeks (around 11/20/2023) for follow-up, anxiety/depression, diabetes, hypertension.  Jenelle Mis, FNP

## 2023-10-23 NOTE — Assessment & Plan Note (Signed)
 Restart Abilify  2mg  daily.

## 2023-10-24 ENCOUNTER — Ambulatory Visit: Payer: Self-pay | Admitting: Family Medicine

## 2023-10-24 ENCOUNTER — Other Ambulatory Visit: Payer: Self-pay

## 2023-10-24 MED ORDER — SEMAGLUTIDE(0.25 OR 0.5MG/DOS) 2 MG/3ML ~~LOC~~ SOPN
PEN_INJECTOR | SUBCUTANEOUS | 0 refills | Status: AC
  Filled 2023-10-24 – 2023-11-25 (×2): qty 3, 56d supply, fill #0

## 2023-10-25 LAB — IRON,TIBC AND FERRITIN PANEL
%SAT: 22 % (ref 20–48)
Ferritin: 31 ng/mL — ABNORMAL LOW (ref 38–380)
Iron: 73 ug/dL (ref 50–180)
TIBC: 328 ug/dL (ref 250–425)

## 2023-10-25 LAB — CBC WITH DIFFERENTIAL/PLATELET
Absolute Lymphocytes: 1605 {cells}/uL (ref 850–3900)
Absolute Monocytes: 373 {cells}/uL (ref 200–950)
Basophils Absolute: 32 {cells}/uL (ref 0–200)
Basophils Relative: 0.7 %
Eosinophils Absolute: 120 {cells}/uL (ref 15–500)
Eosinophils Relative: 2.6 %
HCT: 39.2 % (ref 38.5–50.0)
Hemoglobin: 12.4 g/dL — ABNORMAL LOW (ref 13.2–17.1)
MCH: 29.7 pg (ref 27.0–33.0)
MCHC: 31.6 g/dL — ABNORMAL LOW (ref 32.0–36.0)
MCV: 93.8 fL (ref 80.0–100.0)
MPV: 11.7 fL (ref 7.5–12.5)
Monocytes Relative: 8.1 %
Neutro Abs: 2470 {cells}/uL (ref 1500–7800)
Neutrophils Relative %: 53.7 %
Platelets: 173 10*3/uL (ref 140–400)
RBC: 4.18 10*6/uL — ABNORMAL LOW (ref 4.20–5.80)
RDW: 13.5 % (ref 11.0–15.0)
Total Lymphocyte: 34.9 %
WBC: 4.6 10*3/uL (ref 3.8–10.8)

## 2023-10-25 LAB — LIPID PANEL
Cholesterol: 160 mg/dL (ref <200)
HDL: 37 mg/dL — ABNORMAL LOW (ref >=40)
LDL Cholesterol (Calc): 95 mg/dL
Non-HDL Cholesterol (Calc): 123 mg/dL (ref <130)
Total CHOL/HDL Ratio: 4.3 (calc) (ref <5.0)
Triglycerides: 184 mg/dL — ABNORMAL HIGH (ref <150)

## 2023-10-25 LAB — COMPREHENSIVE METABOLIC PANEL WITH GFR
AG Ratio: 1.4 (calc) (ref 1.0–2.5)
ALT: 9 U/L (ref 9–46)
AST: 11 U/L (ref 10–35)
Albumin: 3.8 g/dL (ref 3.6–5.1)
Alkaline phosphatase (APISO): 82 U/L (ref 35–144)
BUN/Creatinine Ratio: 19 (calc) (ref 6–22)
BUN: 12 mg/dL (ref 7–25)
CO2: 26 mmol/L (ref 20–32)
Calcium: 9.1 mg/dL (ref 8.6–10.3)
Chloride: 106 mmol/L (ref 98–110)
Creat: 0.63 mg/dL — ABNORMAL LOW (ref 0.70–1.30)
Globulin: 2.7 g/dL (ref 1.9–3.7)
Glucose, Bld: 219 mg/dL — ABNORMAL HIGH (ref 65–99)
Potassium: 4.9 mmol/L (ref 3.5–5.3)
Sodium: 141 mmol/L (ref 135–146)
Total Bilirubin: 0.3 mg/dL (ref 0.2–1.2)
Total Protein: 6.5 g/dL (ref 6.1–8.1)
eGFR: 114 mL/min/{1.73_m2} (ref >=60)

## 2023-10-25 LAB — MICROALBUMIN / CREATININE URINE RATIO
Creatinine, Urine: 116 mg/dL (ref 20–320)
Microalb Creat Ratio: 9 mg/g{creat} (ref <30)
Microalb, Ur: 1 mg/dL

## 2023-10-25 LAB — TEST AUTHORIZATION

## 2023-10-25 LAB — RETICULOCYTES
ABS Retic: 54730 {cells}/uL (ref 25000–90000)
Retic Ct Pct: 1.3 %

## 2023-10-25 LAB — HEMOGLOBIN A1C
Hgb A1c MFr Bld: 8.9 % — ABNORMAL HIGH (ref <5.7)
Mean Plasma Glucose: 209 mg/dL
eAG (mmol/L): 11.6 mmol/L

## 2023-10-25 LAB — B12 AND FOLATE PANEL
Folate: 17.9 ng/mL
Vitamin B-12: 538 pg/mL (ref 200–1100)

## 2023-11-21 ENCOUNTER — Ambulatory Visit: Admitting: Family Medicine

## 2023-11-25 ENCOUNTER — Other Ambulatory Visit: Payer: Self-pay | Admitting: Family Medicine

## 2023-11-25 ENCOUNTER — Other Ambulatory Visit (HOSPITAL_BASED_OUTPATIENT_CLINIC_OR_DEPARTMENT_OTHER): Payer: Self-pay

## 2023-11-25 ENCOUNTER — Other Ambulatory Visit: Payer: Self-pay

## 2023-11-25 DIAGNOSIS — G5603 Carpal tunnel syndrome, bilateral upper limbs: Secondary | ICD-10-CM

## 2023-11-25 DIAGNOSIS — F329 Major depressive disorder, single episode, unspecified: Secondary | ICD-10-CM

## 2023-11-29 ENCOUNTER — Encounter: Payer: Self-pay | Admitting: Pharmacist

## 2023-11-29 ENCOUNTER — Other Ambulatory Visit: Payer: Self-pay

## 2023-12-11 ENCOUNTER — Other Ambulatory Visit: Payer: Self-pay

## 2023-12-29 ENCOUNTER — Other Ambulatory Visit: Payer: Self-pay

## 2023-12-29 ENCOUNTER — Emergency Department (HOSPITAL_BASED_OUTPATIENT_CLINIC_OR_DEPARTMENT_OTHER)
Admission: EM | Admit: 2023-12-29 | Discharge: 2023-12-29 | Disposition: A | Discharge status: Home / Self Care | Payer: MEDICAID | Attending: Emergency Medicine | Admitting: Emergency Medicine

## 2023-12-29 ENCOUNTER — Other Ambulatory Visit (HOSPITAL_COMMUNITY): Payer: Self-pay

## 2023-12-29 DIAGNOSIS — I10 Essential (primary) hypertension: Secondary | ICD-10-CM | POA: Insufficient documentation

## 2023-12-29 DIAGNOSIS — R739 Hyperglycemia, unspecified: Secondary | ICD-10-CM | POA: Insufficient documentation

## 2023-12-29 DIAGNOSIS — R42 Dizziness and giddiness: Secondary | ICD-10-CM | POA: Diagnosis present

## 2023-12-29 DIAGNOSIS — F32A Depression, unspecified: Secondary | ICD-10-CM | POA: Diagnosis not present

## 2023-12-29 DIAGNOSIS — Z79899 Other long term (current) drug therapy: Secondary | ICD-10-CM | POA: Diagnosis not present

## 2023-12-29 LAB — BASIC METABOLIC PANEL WITH GFR
Anion gap: 12 (ref 5–15)
BUN: 14 mg/dL (ref 6–20)
CO2: 26 mmol/L (ref 22–32)
Calcium: 9.6 mg/dL (ref 8.9–10.3)
Chloride: 99 mmol/L (ref 98–111)
Creatinine, Ser: 0.79 mg/dL (ref 0.61–1.24)
GFR, Estimated: >60 mL/min (ref >60)
Glucose, Bld: 315 mg/dL — ABNORMAL HIGH (ref 70–99)
Potassium: 4.6 mmol/L (ref 3.5–5.1)
Sodium: 137 mmol/L (ref 135–145)

## 2023-12-29 LAB — CBC WITH DIFFERENTIAL/PLATELET
Abs Immature Granulocytes: 0.01 K/uL (ref 0.00–0.07)
Basophils Absolute: 0 K/uL (ref 0.0–0.1)
Basophils Relative: 0 %
Eosinophils Absolute: 0.2 K/uL (ref 0.0–0.5)
Eosinophils Relative: 2 %
HCT: 39.1 % (ref 39.0–52.0)
Hemoglobin: 12.7 g/dL — ABNORMAL LOW (ref 13.0–17.0)
Immature Granulocytes: 0 %
Lymphocytes Relative: 27 %
Lymphs Abs: 1.8 K/uL (ref 0.7–4.0)
MCH: 29.6 pg (ref 26.0–34.0)
MCHC: 32.5 g/dL (ref 30.0–36.0)
MCV: 91.1 fL (ref 80.0–100.0)
Monocytes Absolute: 0.5 K/uL (ref 0.1–1.0)
Monocytes Relative: 7 %
Neutro Abs: 4.4 K/uL (ref 1.7–7.7)
Neutrophils Relative %: 64 %
Platelets: DECREASED K/uL (ref 150–400)
RBC: 4.29 MIL/uL (ref 4.22–5.81)
RDW: 14.3 % (ref 11.5–15.5)
WBC: 6.9 K/uL (ref 4.0–10.5)
nRBC: 0 % (ref 0.0–0.2)

## 2023-12-29 MED ORDER — METFORMIN HCL 500 MG PO TABS
1000.0000 mg | ORAL_TABLET | Freq: Once | ORAL | Status: AC
  Administered 2023-12-29: 1000 mg via ORAL
  Filled 2023-12-29: qty 2

## 2023-12-29 MED ORDER — METFORMIN HCL 500 MG PO TABS
500.0000 mg | ORAL_TABLET | Freq: Two times a day (BID) | ORAL | 0 refills | Status: AC
  Filled 2023-12-29: qty 60, 30d supply, fill #0

## 2023-12-29 MED ORDER — HYDROXYZINE HCL 25 MG PO TABS
25.0000 mg | ORAL_TABLET | Freq: Four times a day (QID) | ORAL | 0 refills | Status: AC | PRN
  Filled 2023-12-29: qty 12, 3d supply, fill #0

## 2023-12-29 MED ORDER — AMLODIPINE BESYLATE 10 MG PO TABS
5.0000 mg | ORAL_TABLET | Freq: Every day | ORAL | 0 refills | Status: AC
  Filled 2023-12-29: qty 15, 30d supply, fill #0

## 2023-12-29 MED ORDER — AMLODIPINE BESYLATE 5 MG PO TABS
5.0000 mg | ORAL_TABLET | Freq: Once | ORAL | Status: AC
  Administered 2023-12-29: 5 mg via ORAL
  Filled 2023-12-29: qty 1

## 2023-12-29 NOTE — ED Triage Notes (Signed)
 Pt noticed elevated BP throughout the week. Reports a at home BP of 204/103. Associated with intermittent dizziness and feeling flushed. Denies chest pain or SOB. No complaint with BP medication and endorses a lot of stress.

## 2023-12-29 NOTE — ED Provider Notes (Signed)
 [place] EMERGENCY DEPARTMENT AT [facility] Provider Note   CSN: [id number] Arrival date & time: 12/29/23  0114     Patient presents with: Hypertension   TOD ABRAHAMSEN is a 53 y.o. male.   53 yo M here with hypertension. Patient noncompliant with medications and doesn't currently have a PCP. Also states he's been hyperglycemic and used to be on metformin  and amlodipine . Had some intermittent dizziness but no cp, sob, edema, HA or other associated symptoms. As high as 204/103 at home.    Hypertension       Prior to Admission medications   Medication Sig Start Date End Date Taking? Authorizing Provider  amLODipine  (NORVASC ) 10 MG tablet Take 0.5 tablets (5 mg total) by mouth daily. 12/29/23 02/27/24 Yes Parley Pidcock, MD  hydrOXYzine (ATARAX) 25 MG tablet Take 1 tablet (25 mg total) by mouth every 6 (six) hours as needed for anxiety. 12/29/23  Yes Nilo Fallin, Selinda, MD  metFORMIN  (GLUCOPHAGE ) 500 MG tablet Take 1 tablet (500 mg total) by mouth 2 (two) times daily with a meal. 12/29/23 02/27/24 Yes Buelah Rennie, Selinda, MD  ARIPiprazole  (ABILIFY ) 5 MG tablet Take 1 tablet (5 mg total) by mouth daily. 10/23/23   Kayla Jeoffrey RAMAN, FNP  atorvastatin  (LIPITOR) 40 MG tablet Take 1 tablet (40 mg total) by mouth daily. 05/08/23   Kayla Jeoffrey RAMAN, FNP  DULoxetine  (CYMBALTA ) 60 MG capsule Take 1 capsule (60 mg total) by mouth daily. 04/24/23   Kayla Jeoffrey RAMAN, FNP  EPINEPHrine  (EPIPEN  2-PAK) 0.3 mg/0.3 mL IJ SOAJ injection Inject 0.3 mg into the muscle once as needed for up to 1 dose (for severe allergic reaction). CAll 911 immediately if you have to use this medicine 07/24/23   Patsey Lot, MD  gabapentin  (NEURONTIN ) 300 MG capsule Take 1 capsule (300 mg total) by mouth 2 (two) times daily. 10/23/23   Kayla Jeoffrey RAMAN, FNP  methadone  (DOLOPHINE ) 10 MG/ML solution Take 70 mg by mouth daily.    [provider]  omeprazole  (PRILOSEC) 40 MG capsule Take 1 capsule (40 mg total) by mouth daily.  03/27/23   Kayla Jeoffrey RAMAN, FNP  RIVAROXABAN  (XARELTO ) VTE STARTER PACK (15 & 20 MG) Follow package directions: Take one 15mg  tablet by mouth twice a day. On day 22, switch to one 20mg  tablet once a day. Take with food. 09/19/23   Geiple, Joshua, PA-C    Allergies: Beef-derived drug products, Food, Ioxaglate, Ivp dye [iodinated contrast media], Pork-derived products, and Shellfish allergy     Review of Systems  Updated Vital Signs BP 137/78   Pulse 81   Temp 98.2 F (36.8 C) (Oral)   Resp 15   SpO2 95%   Physical Exam Vitals and nursing note reviewed.  Constitutional:      Appearance: He is well-developed.  HENT:     Head: Normocephalic and atraumatic.  Cardiovascular:     Rate and Rhythm: Normal rate.  Pulmonary:     Effort: Pulmonary effort is normal. No respiratory distress.  Abdominal:     General: There is no distension.  Musculoskeletal:        General: Normal range of motion.     Cervical back: Normal range of motion.  Neurological:     Mental Status: He is alert.     (all labs ordered are listed, but only abnormal results are displayed) Labs Reviewed  CBC WITH DIFFERENTIAL/PLATELET - Abnormal; Notable for the following components:      Result Value   Hemoglobin 12.7 (*)  All other components within normal limits  BASIC METABOLIC PANEL WITH GFR - Abnormal; Notable for the following components:   Glucose, Bld 315 (*)    All other components within normal limits    EKG: None  Radiology: No results found.   Procedures   Medications Ordered in the ED  metFORMIN  (GLUCOPHAGE ) tablet 1,000 mg (1,000 mg Oral Given 12/29/23 0208)  amLODipine  (NORVASC ) tablet 5 mg (5 mg Oral Given 12/29/23 0208)                                    Medical Decision Making Amount and/or Complexity of Data Reviewed Labs: ordered.  Risk Prescription drug management.   No e/o end organ damage. Improved with norvasc , rx for norvasc  and metformin  provided. List of PCP's and  mental health facilities provided (depression).      Final diagnoses:  Hypertension, unspecified type  Hyperglycemia  Depression, unspecified depression type    ED Discharge Orders          Ordered    amLODipine  (NORVASC ) 10 MG tablet  Daily        12/29/23 0326    metFORMIN  (GLUCOPHAGE ) 500 MG tablet  2 times daily with meals        12/29/23 0326    hydrOXYzine (ATARAX) 25 MG tablet  Every 6 hours PRN        12/29/23 0334               Josanne Boerema, MD 12/29/23 [phone]

## 2023-12-29 NOTE — ED Notes (Signed)
 EDP at bedside

## 2023-12-29 NOTE — ED Notes (Signed)
 Dc instructions given, pt verbalized understanding. Pt out of ED with steady gait, not in visible distress with all belongings and paperwork. Rx sent to pt preferred pharmacy.

## 2024-01-05 ENCOUNTER — Other Ambulatory Visit (HOSPITAL_COMMUNITY): Payer: Self-pay

## 2024-01-05 ENCOUNTER — Other Ambulatory Visit: Payer: Self-pay

## 2024-01-05 ENCOUNTER — Emergency Department (HOSPITAL_BASED_OUTPATIENT_CLINIC_OR_DEPARTMENT_OTHER)
Admission: EM | Admit: 2024-01-05 | Discharge: 2024-01-05 | Disposition: A | Discharge status: Home / Self Care | Payer: MEDICAID | Attending: Emergency Medicine | Admitting: Emergency Medicine

## 2024-01-05 ENCOUNTER — Encounter (HOSPITAL_BASED_OUTPATIENT_CLINIC_OR_DEPARTMENT_OTHER): Payer: Self-pay

## 2024-01-05 DIAGNOSIS — L03115 Cellulitis of right lower limb: Secondary | ICD-10-CM | POA: Diagnosis not present

## 2024-01-05 DIAGNOSIS — E119 Type 2 diabetes mellitus without complications: Secondary | ICD-10-CM | POA: Diagnosis not present

## 2024-01-05 DIAGNOSIS — Z7901 Long term (current) use of anticoagulants: Secondary | ICD-10-CM | POA: Insufficient documentation

## 2024-01-05 DIAGNOSIS — Z7984 Long term (current) use of oral hypoglycemic drugs: Secondary | ICD-10-CM | POA: Insufficient documentation

## 2024-01-05 DIAGNOSIS — M79604 Pain in right leg: Secondary | ICD-10-CM | POA: Diagnosis present

## 2024-01-05 LAB — CBC WITH DIFFERENTIAL/PLATELET
Abs Immature Granulocytes: 0.01 K/uL (ref 0.00–0.07)
Basophils Absolute: 0 K/uL (ref 0.0–0.1)
Basophils Relative: 0 %
Eosinophils Absolute: 0.2 K/uL (ref 0.0–0.5)
Eosinophils Relative: 3 %
HCT: 44.6 % (ref 39.0–52.0)
Hemoglobin: 14.6 g/dL (ref 13.0–17.0)
Immature Granulocytes: 0 %
Lymphocytes Relative: 21 %
Lymphs Abs: 1.4 K/uL (ref 0.7–4.0)
MCH: 29.9 pg (ref 26.0–34.0)
MCHC: 32.7 g/dL (ref 30.0–36.0)
MCV: 91.2 fL (ref 80.0–100.0)
Monocytes Absolute: 0.4 K/uL (ref 0.1–1.0)
Monocytes Relative: 6 %
Neutro Abs: 4.6 K/uL (ref 1.7–7.7)
Neutrophils Relative %: 70 %
Platelets: 197 K/uL (ref 150–400)
RBC: 4.89 MIL/uL (ref 4.22–5.81)
RDW: 14.2 % (ref 11.5–15.5)
WBC: 6.5 K/uL (ref 4.0–10.5)
nRBC: 0 % (ref 0.0–0.2)

## 2024-01-05 LAB — BASIC METABOLIC PANEL WITH GFR
Anion gap: 16 — ABNORMAL HIGH (ref 5–15)
BUN: 11 mg/dL (ref 6–20)
CO2: 22 mmol/L (ref 22–32)
Calcium: 9.5 mg/dL (ref 8.9–10.3)
Chloride: 99 mmol/L (ref 98–111)
Creatinine, Ser: 0.86 mg/dL (ref 0.61–1.24)
GFR, Estimated: >60 mL/min (ref >60)
Glucose, Bld: 458 mg/dL — ABNORMAL HIGH (ref 70–99)
Potassium: 4.3 mmol/L (ref 3.5–5.1)
Sodium: 136 mmol/L (ref 135–145)

## 2024-01-05 LAB — PROTIME-INR
INR: 1.4 — ABNORMAL HIGH (ref 0.8–1.2)
Prothrombin Time: 17.9 s — ABNORMAL HIGH (ref 11.4–15.2)

## 2024-01-05 LAB — CBG MONITORING, ED: Glucose-Capillary: 425 mg/dL — ABNORMAL HIGH (ref 70–99)

## 2024-01-05 MED ORDER — AMOXICILLIN-POT CLAVULANATE 875-125 MG PO TABS: 1.0000 | ORAL_TABLET | Freq: Two times a day (BID) | ORAL | 0 refills | Status: DC

## 2024-01-05 MED ORDER — SODIUM CHLORIDE 0.9 % IV SOLN: 1.0000 g | Freq: Once | INTRAVENOUS | Status: DC

## 2024-01-05 MED ORDER — DOXYCYCLINE HYCLATE 100 MG PO TABS
100.0000 mg | ORAL_TABLET | Freq: Once | ORAL | Status: AC
  Administered 2024-01-05: 100 mg via ORAL
  Filled 2024-01-05: qty 1

## 2024-01-05 MED ORDER — CEFTRIAXONE SODIUM 1 G IJ SOLR
1.0000 g | Freq: Once | INTRAMUSCULAR | Status: AC
  Administered 2024-01-05: 1 g via INTRAMUSCULAR
  Filled 2024-01-05: qty 10

## 2024-01-05 MED ORDER — DOXYCYCLINE HYCLATE 100 MG PO CAPS: 100.0000 mg | ORAL_CAPSULE | Freq: Two times a day (BID) | ORAL | 0 refills | Status: DC

## 2024-01-05 MED ORDER — SODIUM CHLORIDE 0.9 % IV BOLUS: 1000.0000 mL | Freq: Once | INTRAVENOUS | Status: DC

## 2024-01-05 MED ORDER — LIDOCAINE HCL (PF) 1 % IJ SOLN
INTRAMUSCULAR | Status: AC
  Administered 2024-01-05: 1.5 mL
  Filled 2024-01-05: qty 5

## 2024-01-05 NOTE — ED Provider Notes (Signed)
 [place] EMERGENCY DEPARTMENT AT [facility] Provider Note   CSN: [id number] Arrival date & time: 01/05/24  2015     Patient presents with: Cellulitis   Timothy [name] is a 53 y.o. male.   Patient is a 53 year old male with history of diabetes, GERD, DVT on Xarelto  who is presenting today with a 3-day history of pain and redness in his right leg.  He reports that he was scratching his leg and started noticing it was starting to get infected.  It has been very hot and he has felt like he is running a low-grade temperature.  He is also noticed more swelling of that leg.  He reports similar symptoms in April of this year that resolved after getting antibiotics.  He has been compliant with Xarelto  and takes it with food every day.  He has a known chronic DVT in that leg.  The history is provided by the patient.       Prior to Admission medications   Medication Sig Start Date End Date Taking? Authorizing Provider  amoxicillin -clavulanate (AUGMENTIN ) 875-125 MG tablet Take 1 tablet by mouth every 12 (twelve) hours. 01/05/24  Yes Isela Stantz, Benton, MD  doxycycline  (VIBRAMYCIN ) 100 MG capsule Take 1 capsule (100 mg total) by mouth 2 (two) times daily. 01/05/24  Yes Doretha Benton, MD  amLODipine  (NORVASC ) 10 MG tablet Take 0.5 tablets (5 mg total) by mouth daily. 12/29/23 02/27/24  Mesner, Selinda, MD  ARIPiprazole  (ABILIFY ) 5 MG tablet Take 1 tablet (5 mg total) by mouth daily. 10/23/23   Kayla Jeoffrey RAMAN, FNP  atorvastatin  (LIPITOR) 40 MG tablet Take 1 tablet (40 mg total) by mouth daily. 05/08/23   Kayla Jeoffrey RAMAN, FNP  DULoxetine  (CYMBALTA ) 60 MG capsule Take 1 capsule (60 mg total) by mouth daily. 04/24/23   Kayla Jeoffrey RAMAN, FNP  EPINEPHrine  (EPIPEN  2-PAK) 0.3 mg/0.3 mL IJ SOAJ injection Inject 0.3 mg into the muscle once as needed for up to 1 dose (for severe allergic reaction). CAll 911 immediately if you have to use this medicine 07/24/23   Patsey Lot, MD  gabapentin  (NEURONTIN )  300 MG capsule Take 1 capsule (300 mg total) by mouth 2 (two) times daily. 10/23/23   Kayla Jeoffrey RAMAN, FNP  hydrOXYzine  (ATARAX ) 25 MG tablet Take 1 tablet (25 mg total) by mouth every 6 (six) hours as needed for anxiety. 12/29/23   Mesner, Selinda, MD  metFORMIN  (GLUCOPHAGE ) 500 MG tablet Take 1 tablet (500 mg total) by mouth 2 (two) times daily with a meal. 12/29/23 02/27/24  Mesner, Selinda, MD  methadone  (DOLOPHINE ) 10 MG/ML solution Take 70 mg by mouth daily.    [provider]  omeprazole  (PRILOSEC) 40 MG capsule Take 1 capsule (40 mg total) by mouth daily. 03/27/23   Kayla Jeoffrey RAMAN, FNP  RIVAROXABAN  (XARELTO ) VTE STARTER PACK (15 & 20 MG) Follow package directions: Take one 15mg  tablet by mouth twice a day. On day 22, switch to one 20mg  tablet once a day. Take with food. 09/19/23   Geiple, Joshua, PA-C    Allergies: Beef-derived drug products, Food, Ioxaglate, Ivp dye [iodinated contrast media], Pork-derived products, and Shellfish allergy     Review of Systems  Updated Vital Signs BP (!) 159/98 (BP Location: Right Arm)   Pulse 92   Temp 99.8 F (37.7 C) (Oral)   Resp 16   Ht 6' 5 (1.956 m)   Wt 116 kg   SpO2 99%   BMI 30.33 kg/m   Physical Exam Vitals and  nursing note reviewed.  Constitutional:      General: He is not in acute distress.    Appearance: He is well-developed.  HENT:     Head: Normocephalic and atraumatic.     Mouth/Throat:     Comments: Poor dentition Eyes:     Conjunctiva/sclera: Conjunctivae normal.     Pupils: Pupils are equal, round, and reactive to light.  Cardiovascular:     Rate and Rhythm: Normal rate and regular rhythm.     Heart sounds: No murmur heard. Pulmonary:     Effort: Pulmonary effort is normal. No respiratory distress.     Breath sounds: Normal breath sounds. No wheezing or rales.  Abdominal:     General: There is no distension.     Palpations: Abdomen is soft.     Tenderness: There is no abdominal tenderness. There is no guarding  or rebound.  Musculoskeletal:        General: Tenderness present. Normal range of motion.     Cervical back: Normal range of motion and neck supple.     Comments: Cellulitis noted to the right lower leg mostly in the calf area with erythema and warmth.  No fluctuance or induration noted.  Foot has 2+ DP pulse without any redness or wounds present in the foot  Skin:    General: Skin is warm and dry.     Findings: No erythema or rash.  Neurological:     Mental Status: He is alert and oriented to person, place, and time.  Psychiatric:        Behavior: Behavior normal.     (all labs ordered are listed, but only abnormal results are displayed) Labs Reviewed  BASIC METABOLIC PANEL WITH GFR - Abnormal; Notable for the following components:      Result Value   Glucose, Bld 458 (*)    Anion gap 16 (*)    All other components within normal limits  PROTIME-INR - Abnormal; Notable for the following components:   Prothrombin Time 17.9 (*)    INR 1.4 (*)    All other components within normal limits  CBG MONITORING, ED - Abnormal; Notable for the following components:   Glucose-Capillary 425 (*)    All other components within normal limits  CBC WITH DIFFERENTIAL/PLATELET    EKG: None  Radiology: No results found.   Procedures   Medications Ordered in the ED  cefTRIAXone  (ROCEPHIN ) injection 1 g (has no administration in time range)  doxycycline  (VIBRA -TABS) tablet 100 mg (has no administration in time range)                                    Medical Decision Making Amount and/or Complexity of Data Reviewed Labs: ordered. Decision-making details documented in ED Course.  Risk Prescription drug management.   Pt with multiple medical problems and comorbidities and presenting today with a complaint that caries a high risk for morbidity and mortality.  Here today with findings consistent with cellulitis.  Patient is not systemically ill at this time.  Similar symptoms in the  past.  Patient has a known chronic DVT in this leg but does not have any evidence of vascular compromise.  He has normal pulse and capillary refill in the foot.  He is compliant with his Xarelto .  He has no shortness of breath or chest pain and do not feel that he needs ultrasound of the leg at this  time.  Do feel that he will need antibiotics for the infection.  Again he does not appear systemically ill at this time.  I independently interpreted patient's labs CBC today is within normal limits, CMP with hyperglycemia of 458 but otherwise no other acute findings.  Patient given antibiotics here.  Will have him return for worsening symptoms.  He is comfortable with this plan.      Final diagnoses:  Cellulitis of right lower extremity    ED Discharge Orders          Ordered    amoxicillin -clavulanate (AUGMENTIN ) 875-125 MG tablet  Every 12 hours        01/05/24 2150    doxycycline  (VIBRAMYCIN ) 100 MG capsule  2 times daily        01/05/24 2150               Doretha Folks, MD 01/05/24 2150

## 2024-01-05 NOTE — Discharge Instructions (Addendum)
 Start your antibiotics tomorrow.  The next dose is due in the morning.  If you start having worsening symptoms and is not getting better like it did last time return to the emergency room.  Continue taking your other medications.

## 2024-01-05 NOTE — ED Triage Notes (Signed)
 PT reports cellulitis to R leg and reports having a blood clot in R leg. Pt reports numbness in R foot and cellulitis x2 days. Pt reports taking Xarelto .

## 2024-01-31 LAB — AMB RESULTS CONSOLE CBG: Glucose: 401

## 2024-01-31 NOTE — Progress Notes (Signed)
 Pt has medicaid for insurance but no pcp. Pt did not screen for sdoh. [facility] was [facility].

## 2024-02-15 ENCOUNTER — Telehealth: Payer: Self-pay | Admitting: Family Medicine

## 2024-02-15 NOTE — Telephone Encounter (Signed)
 Forms received by MA will hold until physical is scheduled.

## 2024-02-15 NOTE — Telephone Encounter (Signed)
 Outbound call placed to advise patient we need to schedule an appointment for him to have his paperwork completed since his LOV (cpe) was 11/09/2022.  Number on file is out of service; sent patient a message via MyChart to advise.  Paperwork requiring completion is from [employer] [place].  Paperwork given to provider's CMA to hold until patient's appointment.

## 2024-02-24 ENCOUNTER — Emergency Department (HOSPITAL_BASED_OUTPATIENT_CLINIC_OR_DEPARTMENT_OTHER): Payer: MEDICAID

## 2024-02-24 ENCOUNTER — Other Ambulatory Visit: Payer: Self-pay

## 2024-02-24 ENCOUNTER — Encounter (HOSPITAL_BASED_OUTPATIENT_CLINIC_OR_DEPARTMENT_OTHER): Payer: Self-pay | Admitting: Emergency Medicine

## 2024-02-24 ENCOUNTER — Emergency Department (HOSPITAL_BASED_OUTPATIENT_CLINIC_OR_DEPARTMENT_OTHER)
Admission: EM | Admit: 2024-02-24 | Discharge: 2024-02-24 | Disposition: A | Discharge status: Home / Self Care | Payer: MEDICAID | Attending: Emergency Medicine | Admitting: Emergency Medicine

## 2024-02-24 DIAGNOSIS — W1839XA Other fall on same level, initial encounter: Secondary | ICD-10-CM | POA: Insufficient documentation

## 2024-02-24 DIAGNOSIS — L03115 Cellulitis of right lower limb: Secondary | ICD-10-CM | POA: Diagnosis not present

## 2024-02-24 DIAGNOSIS — R296 Repeated falls: Secondary | ICD-10-CM | POA: Insufficient documentation

## 2024-02-24 DIAGNOSIS — K089 Disorder of teeth and supporting structures, unspecified: Secondary | ICD-10-CM | POA: Insufficient documentation

## 2024-02-24 DIAGNOSIS — S0083XA Contusion of other part of head, initial encounter: Secondary | ICD-10-CM | POA: Insufficient documentation

## 2024-02-24 DIAGNOSIS — Z7901 Long term (current) use of anticoagulants: Secondary | ICD-10-CM | POA: Insufficient documentation

## 2024-02-24 DIAGNOSIS — S0993XA Unspecified injury of face, initial encounter: Secondary | ICD-10-CM | POA: Diagnosis present

## 2024-02-24 LAB — CBC WITH DIFFERENTIAL/PLATELET
Abs Immature Granulocytes: 0.01 K/uL (ref 0.00–0.07)
Basophils Absolute: 0.1 K/uL (ref 0.0–0.1)
Basophils Relative: 1 %
Eosinophils Absolute: 0.3 K/uL (ref 0.0–0.5)
Eosinophils Relative: 5 %
HCT: 38.9 % — ABNORMAL LOW (ref 39.0–52.0)
Hemoglobin: 12.9 g/dL — ABNORMAL LOW (ref 13.0–17.0)
Immature Granulocytes: 0 %
Lymphocytes Relative: 31 %
Lymphs Abs: 1.9 K/uL (ref 0.7–4.0)
MCH: 30.2 pg (ref 26.0–34.0)
MCHC: 33.2 g/dL (ref 30.0–36.0)
MCV: 91.1 fL (ref 80.0–100.0)
Monocytes Absolute: 0.4 K/uL (ref 0.1–1.0)
Monocytes Relative: 7 %
Neutro Abs: 3.5 K/uL (ref 1.7–7.7)
Neutrophils Relative %: 56 %
Platelets: 176 K/uL (ref 150–400)
RBC: 4.27 MIL/uL (ref 4.22–5.81)
RDW: 13.6 % (ref 11.5–15.5)
WBC: 6.2 K/uL (ref 4.0–10.5)
nRBC: 0 % (ref 0.0–0.2)

## 2024-02-24 LAB — BASIC METABOLIC PANEL WITH GFR
Anion gap: 13 (ref 5–15)
BUN: 14 mg/dL (ref 6–20)
CO2: 22 mmol/L (ref 22–32)
Calcium: 9.8 mg/dL (ref 8.9–10.3)
Chloride: 101 mmol/L (ref 98–111)
Creatinine, Ser: 0.72 mg/dL (ref 0.61–1.24)
GFR, Estimated: >60 mL/min (ref >60)
Glucose, Bld: 362 mg/dL — ABNORMAL HIGH (ref 70–99)
Potassium: 4.3 mmol/L (ref 3.5–5.1)
Sodium: 137 mmol/L (ref 135–145)

## 2024-02-24 LAB — C-REACTIVE PROTEIN: CRP: 1 mg/dL — ABNORMAL HIGH (ref <1.0)

## 2024-02-24 LAB — SEDIMENTATION RATE: Sed Rate: 15 mm/h (ref 0–16)

## 2024-02-24 MED ORDER — DOXYCYCLINE HYCLATE 100 MG PO CAPS
100.0000 mg | ORAL_CAPSULE | Freq: Two times a day (BID) | ORAL | 0 refills | Status: AC
  Filled 2024-02-24 – 2024-02-26 (×2): qty 28, 14d supply, fill #0

## 2024-02-24 NOTE — ED Provider Notes (Signed)
 Clinical Course as of 02/24/24 1144  Sat Feb 24, 2024  4262 89 plan-year-old for morning team, disposition pending sed rate/CRP.  Both have resulted and are reassuring.  Continues to have reassuring vital signs, labs reviewed with no overt systemic infection.  CT scans negative.  Has strong distal pulses on my exam with soft compartments.  Will discharge as planned per Dr. Raford [TY]    Clinical Course User Index [TY] Neysa Caron PARAS, DO      Neysa Caron PARAS, DO 02/24/24 1144

## 2024-02-24 NOTE — ED Provider Notes (Signed)
 [place] EMERGENCY DEPARTMENT AT [facility] Provider Note   CSN: [id number] Arrival date & time: 02/24/24  9562     Patient presents with: Leg Pain and Fall   Timothy [name] is a 53 y.o. male.   The history is provided by the patient.  Leg Pain Fall   He has history of diabetes, transient ischemic attack, DVT, pulmonary embolism anticoagulated on rivaroxaban  and comes in with several complaints.  He was treated for cellulitis in his right leg and states that he had run out of the antibiotic about 1 week ago.  His leg continues to be very itchy and red.  He also states that he has fallen several times which he relates to his underlying neuropathy and states that he fell on his face both times.  He knocked out his he has 2 remaining molars.  He is complaining of pain in the right upper jaw.  He denies loss of consciousness with these falls.  He denies fever, chills, sweats.    Prior to Admission medications   Medication Sig Start Date End Date Taking? Authorizing Provider  amLODipine  (NORVASC ) 10 MG tablet Take 0.5 tablets (5 mg total) by mouth daily. 12/29/23 02/27/24  Mesner, Selinda, MD  amoxicillin -clavulanate (AUGMENTIN ) 875-125 MG tablet Take 1 tablet by mouth every 12 (twelve) hours. 01/05/24   Doretha Folks, MD  ARIPiprazole  (ABILIFY ) 5 MG tablet Take 1 tablet (5 mg total) by mouth daily. 10/23/23   Kayla Jeoffrey RAMAN, FNP  atorvastatin  (LIPITOR) 40 MG tablet Take 1 tablet (40 mg total) by mouth daily. 05/08/23   Kayla Jeoffrey RAMAN, FNP  doxycycline  (VIBRAMYCIN ) 100 MG capsule Take 1 capsule (100 mg total) by mouth 2 (two) times daily. 01/05/24   Doretha Folks, MD  DULoxetine  (CYMBALTA ) 60 MG capsule Take 1 capsule (60 mg total) by mouth daily. 04/24/23   Kayla Jeoffrey RAMAN, FNP  EPINEPHrine  (EPIPEN  2-PAK) 0.3 mg/0.3 mL IJ SOAJ injection Inject 0.3 mg into the muscle once as needed for up to 1 dose (for severe allergic reaction). CAll 911 immediately if you have to use this  medicine 07/24/23   Patsey Lot, MD  gabapentin  (NEURONTIN ) 300 MG capsule Take 1 capsule (300 mg total) by mouth 2 (two) times daily. 10/23/23   Kayla Jeoffrey RAMAN, FNP  hydrOXYzine  (ATARAX ) 25 MG tablet Take 1 tablet (25 mg total) by mouth every 6 (six) hours as needed for anxiety. 12/29/23   Mesner, Selinda, MD  metFORMIN  (GLUCOPHAGE ) 500 MG tablet Take 1 tablet (500 mg total) by mouth 2 (two) times daily with a meal. 12/29/23 02/27/24  Mesner, Selinda, MD  methadone  (DOLOPHINE ) 10 MG/ML solution Take 70 mg by mouth daily.    [provider]  omeprazole  (PRILOSEC) 40 MG capsule Take 1 capsule (40 mg total) by mouth daily. 03/27/23   Kayla Jeoffrey RAMAN, FNP  RIVAROXABAN  (XARELTO ) VTE STARTER PACK (15 & 20 MG) Follow package directions: Take one 15mg  tablet by mouth twice a day. On day 22, switch to one 20mg  tablet once a day. Take with food. 09/19/23   Geiple, Joshua, PA-C    Allergies: Beef-derived drug products, Food, Ioxaglate, Ivp dye [iodinated contrast media], Pork-derived products, and Shellfish allergy     Review of Systems  All other systems reviewed and are negative.   Updated Vital Signs BP (!) 183/87 (BP Location: Right Arm)   Pulse 76   Temp 98.7 F (37.1 C) (Oral)   Resp 20   Ht 6' 5 (1.956 m)  Wt 115.7 kg   SpO2 97%   BMI 30.24 kg/m   Physical Exam Vitals and nursing note reviewed.   53 year old male, resting comfortably and in no acute distress. Vital signs are significant for elevated blood pressure. Oxygen saturation is 97%, which is normal. Head is normocephalic and atraumatic. PERRLA, EOMI. Oropharynx is clear.  He has generally poor dentition.  Teeth #2 and 3.  To have been broken off at the gingival line and there is some pallor and swelling of the gingiva around the with mild to moderate tenderness.  Multiple other teeth are missing including at least 1 other that is rotted down to the gingival line. Neck is nontender. Back is nontender and there is no CVA  tenderness. Lungs are clear without rales, wheezes, or rhonchi. Chest is nontender. Heart has regular rate and rhythm without murmur. Abdomen is soft, flat, nontender Extremities: Right leg is slightly swollen and moderately erythematous with excoriations present. Skin is warm and dry without rash. Neurologic: Awake and alert, moves all extremities equally.    (all labs ordered are listed, but only abnormal results are displayed) Labs Reviewed  BASIC METABOLIC PANEL WITH GFR  CBC WITH DIFFERENTIAL/PLATELET  SEDIMENTATION RATE  C-REACTIVE PROTEIN    EKG: None  Radiology: No results found.   Procedures   Medications Ordered in the ED - No data to display                                  Medical Decision Making Amount and/or Complexity of Data Reviewed Labs: ordered. Radiology: ordered.  Risk Prescription drug management.   Ongoing right leg erythema concerning for persistent cellulitis.  I have reviewed his past records, and note ED visit on 8/1 for cellulitis of the right lower extremity, and he was given a 7-day prescription for doxycycline  and amoxicillin -clavulanate.  Multiple falls with facial trauma.  He does appear to have some degree of gingivitis around the spot where he broke off his right upper molars.  I have ordered CT scans to evaluate for facial fracture, intracranial bleeding, cervical spine fracture.  I have reviewed his laboratory tests, and my interpretation is elevated glucose and otherwise normal basic metabolic panel, borderline anemia and otherwise normal CBC.  WBC is 6.2 with normal differential.  Sedimentation rate and C-reactive protein are pending.  At this point, I feel that he can have another attempt at outpatient management of his cellulitis.  This time I we will give him a 2-week course of doxycycline .  CT scans show no acute injury.  I have independently viewed the images, and agree with the radiologist's interpretation.  I feel he will need  a dentist to evaluate his dentition and I suspect he will need to have extraction of some of the roots where he is having some inflammation.  Case signed out to Dr. Neysa to evaluate sedimentation rate.     Final diagnoses:  Cellulitis of right lower leg  Multiple falls  Contusion of face, initial encounter  Chronic anticoagulation  Poor dentition    ED Discharge Orders          Ordered    doxycycline  (VIBRAMYCIN ) 100 MG capsule  2 times daily        02/24/24 0700               Raford Lenis, MD 02/24/24 [id number]

## 2024-02-24 NOTE — Discharge Instructions (Addendum)
 You need to see a dentist about your teeth.  I suspect that she will need to have the retained roots extracted.  Because you are on a blood thinner, if you have a fall where you hit your head, you need to be seen as soon as possible whenever that happens.  It is very important that you take the antibiotic as prescribed.  You need to see your primary care provider after completing the course of antibiotics to assess their response to treatment.

## 2024-02-24 NOTE — ED Notes (Signed)
 Patient transported to CT

## 2024-02-24 NOTE — ED Triage Notes (Addendum)
  Patient comes in with R leg pain that he has been scratching and has become infected.  Patient states he recently had cellulitis in same leg.  Says his diabetes has been poorly controlled and has bad neuropathy in both legs.  Patient states he fell a week ago and knocked out one of his teeth and it is still causing him pain.  Pain 9/10, sharp/stabbing pain in R jaw.  No LOC when fall occurred.

## 2024-02-26 ENCOUNTER — Other Ambulatory Visit: Payer: Self-pay

## 2024-03-11 ENCOUNTER — Encounter: Payer: MEDICAID | Admitting: Family Medicine

## 2024-03-18 ENCOUNTER — Other Ambulatory Visit: Payer: Self-pay

## 2024-03-18 ENCOUNTER — Emergency Department (HOSPITAL_BASED_OUTPATIENT_CLINIC_OR_DEPARTMENT_OTHER)
Admission: EM | Admit: 2024-03-18 | Discharge: 2024-03-19 | Disposition: A | Payer: MEDICAID | Attending: Emergency Medicine | Admitting: Emergency Medicine

## 2024-03-18 DIAGNOSIS — L03115 Cellulitis of right lower limb: Secondary | ICD-10-CM | POA: Insufficient documentation

## 2024-03-18 DIAGNOSIS — T7840XA Allergy, unspecified, initial encounter: Secondary | ICD-10-CM | POA: Insufficient documentation

## 2024-03-18 LAB — BASIC METABOLIC PANEL WITH GFR
Anion gap: 14 (ref 5–15)
BUN: 14 mg/dL (ref 6–20)
CO2: 25 mmol/L (ref 22–32)
Calcium: 10.1 mg/dL (ref 8.9–10.3)
Chloride: 101 mmol/L (ref 98–111)
Creatinine, Ser: 0.75 mg/dL (ref 0.61–1.24)
GFR, Estimated: >60 mL/min (ref >60)
Glucose, Bld: 431 mg/dL — ABNORMAL HIGH (ref 70–99)
Potassium: 4 mmol/L (ref 3.5–5.1)
Sodium: 140 mmol/L (ref 135–145)

## 2024-03-18 LAB — CBC
HCT: 41.3 % (ref 39.0–52.0)
Hemoglobin: 13.8 g/dL (ref 13.0–17.0)
MCH: 30.8 pg (ref 26.0–34.0)
MCHC: 33.4 g/dL (ref 30.0–36.0)
MCV: 92.2 fL (ref 80.0–100.0)
Platelets: 168 K/uL (ref 150–400)
RBC: 4.48 MIL/uL (ref 4.22–5.81)
RDW: 14 % (ref 11.5–15.5)
WBC: 4.6 K/uL (ref 4.0–10.5)
nRBC: 0 % (ref 0.0–0.2)

## 2024-03-18 MED ORDER — EPINEPHRINE 0.3 MG/0.3ML IJ SOAJ
0.3000 mg | Freq: Once | INTRAMUSCULAR | Status: AC
Start: 2024-03-18 — End: 2024-03-18

## 2024-03-18 MED ORDER — FAMOTIDINE IN NACL 20-0.9 MG/50ML-% IV SOLN
20.0000 mg | Freq: Once | INTRAVENOUS | Status: AC
  Administered 2024-03-18: 20 mg via INTRAVENOUS
  Filled 2024-03-18: qty 50

## 2024-03-18 MED ORDER — DIPHENHYDRAMINE HCL 50 MG/ML IJ SOLN
25.0000 mg | Freq: Once | INTRAMUSCULAR | Status: AC
  Administered 2024-03-18: 25 mg via INTRAVENOUS
  Filled 2024-03-18: qty 1

## 2024-03-18 MED ORDER — SODIUM CHLORIDE 0.9 % IV SOLN: INTRAVENOUS | Status: DC

## 2024-03-18 MED ORDER — METHYLPREDNISOLONE SODIUM SUCC 125 MG IJ SOLR
125.0000 mg | Freq: Once | INTRAMUSCULAR | Status: AC
  Administered 2024-03-18: 125 mg via INTRAVENOUS
  Filled 2024-03-18: qty 2

## 2024-03-18 MED ORDER — SODIUM CHLORIDE 0.9 % IV BOLUS
1000.0000 mL | Freq: Once | INTRAVENOUS | Status: AC
  Administered 2024-03-18: 1000 mL via INTRAVENOUS

## 2024-03-18 MED ORDER — EPINEPHRINE 0.3 MG/0.3ML IJ SOAJ
INTRAMUSCULAR | Status: AC
  Administered 2024-03-18: 0.3 mg via INTRAMUSCULAR
  Filled 2024-03-18: qty 0.3

## 2024-03-18 NOTE — ED Provider Notes (Signed)
  Physical Exam   Vitals:   03/18/24 2040 03/18/24 2045 03/18/24 2115 03/18/24 2145  BP:  (!) 144/72 137/62 (!) 145/64  Pulse: 85 89 85 81  Resp: 18 19 14 14   Temp:      SpO2: 96% 97% 100% 95%    Physical Exam  Procedures  Procedures  ED Course / MDM    Medical Decision Making Amount and/or Complexity of Data Reviewed Labs: ordered.  Risk Prescription drug management.   Patient received at shift change from prior EDP Lavanda Lesches PA-C.  Please see their note for initial history, physical exam findings, lab interpretations, medication management, initial assessment and plan.  Patient with history of alpha gal allergy , believes that gravy he ate earlier in the day may have contained sausage.  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  Prior to arrival patient began to have itching of his palms followed by tongue swelling, which is typical of his reactions. He received IM Epi ~8:10pm, as well as benadryl /pepcid /solumedrol and IV fluids. Per prior EDP swelling has improved at time of her reassessment, will continue to observe for continued improvement.   At time my reassessment, patient's tongue swelling has entirely resolved.  He does complain of redness to his right lower extremity, attributes this to a recurrence of his cellulitis which he experiences frequently.  He does have a history of DVT and is on Xarelto , he is compliant with this medication.  Right lower extremity is mildly erythematous/warm, I do believe that this is most consistent with a diagnosis of cellulitis and will prescribe antibiotics. He is afebrile and without leukocytosis. Patient does have EpiPen  at home.  I discussed the signs/symptoms of anaphylaxis in depth with the patient, I advised that if the symptoms return he is to administer his EpiPen  at home and call 911.  He voiced understanding and is in agreement with this plan.   He is appropriate for discharge at this time.       Glendia Rocky SAILOR, PA-C 03/19/24 Timothy [name]    Doretha Folks, MD 03/19/24 2000

## 2024-03-18 NOTE — ED Triage Notes (Signed)
 Pt POV reporting tongue swelling after eating pork, hx anaphylaxis.  EPI given 2010

## 2024-03-18 NOTE — ED Provider Notes (Signed)
 [facility] AT [facility] Provider Note   CSN: [id number] Arrival date & time: 03/18/24  2005     Patient presents with: Allergic Reaction   Timothy [name] is a 53 y.o. male.  Who presents emergency department with chief complaint of tongue swelling.  He has a history of alpha gal allergy .  He reports that his mother has some dementia and he found out that she put some sausage and the gravy he ate this morning.  He has a history of delayed onset type reaction states that his reaction usually occurs about 8-hour after exposure to meat.  He reports that he does have a history of intubation at 1 time which was his very first reaction before he found out he had alpha gal allergy .  Since that time he has been treated with anaphylactic medications including epinephrine  Solu-Medrol  Pepcid  and Benadryl  usually has to be observed for some time with resolution.  Patient complains of swelling in his anterior tongue but denies any throat swelling nausea vomiting loss of consciousness hives wheezing or throat tightness.  He received epinephrine  at 8:10 PM.    Allergic Reaction      Prior to Admission medications   Medication Sig Start Date End Date Taking? Authorizing Provider  amLODipine  (NORVASC ) 10 MG tablet Take 0.5 tablets (5 mg total) by mouth daily. 12/29/23 02/27/24  Mesner, Selinda, MD  ARIPiprazole  (ABILIFY ) 5 MG tablet Take 1 tablet (5 mg total) by mouth daily. 10/23/23   Kayla Jeoffrey RAMAN, FNP  atorvastatin  (LIPITOR) 40 MG tablet Take 1 tablet (40 mg total) by mouth daily. 05/08/23   Kayla Jeoffrey RAMAN, FNP  doxycycline  (VIBRAMYCIN ) 100 MG capsule Take 1 capsule (100 mg total) by mouth 2 (two) times daily. 02/24/24   Raford Lenis, MD  DULoxetine  (CYMBALTA ) 60 MG capsule Take 1 capsule (60 mg total) by mouth daily. 04/24/23   Kayla Jeoffrey RAMAN, FNP  EPINEPHrine  (EPIPEN  2-PAK) 0.3 mg/0.3 mL IJ SOAJ injection Inject 0.3 mg into the muscle once as needed for up to 1 dose (for  severe allergic reaction). CAll 911 immediately if you have to use this medicine 07/24/23   Patsey Lot, MD  gabapentin  (NEURONTIN ) 300 MG capsule Take 1 capsule (300 mg total) by mouth 2 (two) times daily. 10/23/23   Kayla Jeoffrey RAMAN, FNP  hydrOXYzine  (ATARAX ) 25 MG tablet Take 1 tablet (25 mg total) by mouth every 6 (six) hours as needed for anxiety. 12/29/23   Mesner, Selinda, MD  metFORMIN  (GLUCOPHAGE ) 500 MG tablet Take 1 tablet (500 mg total) by mouth 2 (two) times daily with a meal. 12/29/23 02/27/24  Mesner, Selinda, MD  methadone  (DOLOPHINE ) 10 MG/ML solution Take 70 mg by mouth daily.    [provider]  omeprazole  (PRILOSEC) 40 MG capsule Take 1 capsule (40 mg total) by mouth daily. 03/27/23   Kayla Jeoffrey RAMAN, FNP  RIVAROXABAN  (XARELTO ) VTE STARTER PACK (15 & 20 MG) Follow package directions: Take one 15mg  tablet by mouth twice a day. On day 22, switch to one 20mg  tablet once a day. Take with food. 09/19/23   Geiple, Joshua, PA-C    Allergies: Bovine (beef) protein-containing drug products, Food, Ioxaglate, Ivp dye [iodinated contrast media], Porcine (pork) protein-containing drug products, and Shellfish allergy     Review of Systems  Updated Vital Signs BP (!) 170/94   Pulse 94   Temp 98.7 F (37.1 C)   Resp 16   SpO2 95%   Physical Exam Vitals and nursing note  reviewed.  Constitutional:      General: He is not in acute distress.    Appearance: He is well-developed. He is not diaphoretic.  HENT:     Head: Normocephalic and atraumatic.     Mouth/Throat:     Comments: Angioedema of the predominantly anterior tongue, oropharynx is otherwise without swelling.  No stridor, no wheezing Eyes:     General: No scleral icterus.    Conjunctiva/sclera: Conjunctivae normal.  Cardiovascular:     Rate and Rhythm: Normal rate and regular rhythm.     Heart sounds: Normal heart sounds.  Pulmonary:     Effort: Pulmonary effort is normal. No respiratory distress.     Breath sounds:  Normal breath sounds. No wheezing.  Abdominal:     Palpations: Abdomen is soft.     Tenderness: There is no abdominal tenderness.  Musculoskeletal:     Cervical back: Normal range of motion and neck supple.  Skin:    General: Skin is warm and dry.     Findings: No rash.     Comments: No urticaria  Neurological:     Mental Status: He is alert.  Psychiatric:        Behavior: Behavior normal.     (all labs ordered are listed, but only abnormal results are displayed) Labs Reviewed - No data to display  EKG: None  Radiology: No results found.   .Critical Care  Performed by: Arloa Chroman, PA-C Authorized by: Arloa Chroman, PA-C   Critical care provider statement:    Critical care time (minutes):  60   Critical care time was exclusive of:  Separately billable procedures and treating other patients   Critical care was necessary to treat or prevent imminent or life-threatening deterioration of the following conditions: Angioedema, alpha gal allergy .   Critical care was time spent personally by me on the following activities:  Development of treatment plan with patient or surrogate, discussions with consultants, evaluation of patient's response to treatment, examination of patient, ordering and review of laboratory studies, ordering and review of radiographic studies, ordering and performing treatments and interventions, pulse oximetry, re-evaluation of patient's condition, review of old charts, interpretation of cardiac output measurements and obtaining history from patient or surrogate    Medications Ordered in the ED  EPINEPHrine  (EPI-PEN) injection 0.3 mg (0.3 mg Intramuscular Given 03/18/24 2010)                                    Medical Decision Making This is a 53 year old male with a history of alpha gal allergy  exposure to sausage with delayed onset reaction starting around 7:30 PM tonight.  Patient received IM epinephrine  and is being treated currently with  Solu-Medrol , Pepcid , states Benadryl , cardiac monitoring.  Patient's tongue swelling has improved significantly but still present and mostly on the anterior periphery his tongue.  He has no stridor.  I doubt any other underlying cause such as ACE inhibitor angioedema or familial angioedema.  He has been diagnosed with alpha gal and had a known meat exposure.  Patient will continue to have bedside monitoring.  I have given signout to PA Scott at shift handoff.  Amount and/or Complexity of Data Reviewed Labs: ordered.    Details: I independently interpreted labs.  He does have an elevated blood glucose.  CBC without abnormal finding. ECG/medicine tests: independent interpretation performed.    Details:    Risk Prescription drug management.  EKG appears to show sinus rhythm at a rate of 87     Final diagnoses:  None    ED Discharge Orders     None          Arloa Chroman, PA-C 03/18/24 2159    Doretha Folks, MD 03/18/24 2336

## 2024-03-19 MED ORDER — CEPHALEXIN 500 MG PO CAPS: 500.0000 mg | ORAL_CAPSULE | Freq: Four times a day (QID) | ORAL | 0 refills | Status: AC

## 2024-03-19 NOTE — Discharge Instructions (Signed)
 Monitor for signs of anaphylaxis, if these symptoms return please administer your Epi pen and dial 911. Start Keflex , 1 tablet by mouth 4 times daily for 7 days for suspected cellulitis of your right lower extremity.  Follow-up with your PCP in regard to your recurrent cellulitis.  Return to the emergency department if your symptoms worsen.

## 2024-03-19 NOTE — Progress Notes (Signed)
 Pt attended 01/31/2024 screening event with BP of 147/100.  Per initial f/u pt PCP was sent curtesy in-basket message.   Per chart review pt does have a PCP (Timothy [name]; [facility]), insurance, and is a former smoker. Pt's last appt with PCP was 10/23/2023. Pt does not indicate any SDOH needs at this time.  No additional pt f/u to be scheduled at this time per health equity protocol.

## 2024-03-25 ENCOUNTER — Other Ambulatory Visit: Payer: Self-pay

## 2024-04-02 ENCOUNTER — Other Ambulatory Visit: Payer: Self-pay

## 2024-04-03 ENCOUNTER — Other Ambulatory Visit: Payer: Self-pay

## 2024-04-04 ENCOUNTER — Other Ambulatory Visit: Payer: Self-pay

## 2024-04-08 ENCOUNTER — Other Ambulatory Visit: Payer: Self-pay
